# Patient Record
Sex: Female | Born: 1965
Health system: Southern US, Community
[De-identification: ages and names within clinical notes are randomized; demographics above are authoritative.]

## PROBLEM LIST (undated history)

## (undated) DIAGNOSIS — N6459 Other signs and symptoms in breast: Secondary | ICD-10-CM

## (undated) DIAGNOSIS — F419 Anxiety disorder, unspecified: Secondary | ICD-10-CM

## (undated) DIAGNOSIS — F329 Major depressive disorder, single episode, unspecified: Secondary | ICD-10-CM

## (undated) DIAGNOSIS — E669 Obesity, unspecified: Secondary | ICD-10-CM

## (undated) DIAGNOSIS — K219 Gastro-esophageal reflux disease without esophagitis: Secondary | ICD-10-CM

## (undated) DIAGNOSIS — F32A Depression, unspecified: Secondary | ICD-10-CM

## (undated) DIAGNOSIS — K5900 Constipation, unspecified: Secondary | ICD-10-CM

## (undated) DIAGNOSIS — N390 Urinary tract infection, site not specified: Secondary | ICD-10-CM

## (undated) HISTORY — PX: CHOLECYSTECTOMY: SHX55

## (undated) HISTORY — PX: TONSILLECTOMY: SUR1361

## (undated) HISTORY — DX: Major depressive disorder, single episode, unspecified: F32.9

## (undated) HISTORY — PX: TUBAL LIGATION: SHX77

## (undated) HISTORY — DX: Urinary tract infection, site not specified: N39.0

## (undated) HISTORY — DX: Constipation, unspecified: K59.00

## (undated) HISTORY — DX: Gastro-esophageal reflux disease without esophagitis: K21.9

## (undated) HISTORY — DX: Anxiety disorder, unspecified: F41.9

## (undated) HISTORY — DX: Other signs and symptoms in breast: N64.59

## (undated) HISTORY — DX: Obesity, unspecified: E66.9

## (undated) HISTORY — DX: Depression, unspecified: F32.A

---

## 1997-08-09 ENCOUNTER — Ambulatory Visit (HOSPITAL_COMMUNITY): Admission: RE | Admit: 1997-08-09 | Discharge: 1997-08-09 | Payer: Self-pay | Admitting: Obstetrics and Gynecology

## 1997-11-18 ENCOUNTER — Other Ambulatory Visit: Admission: RE | Admit: 1997-11-18 | Discharge: 1997-11-18 | Payer: Self-pay | Admitting: Obstetrics and Gynecology

## 1998-07-20 ENCOUNTER — Encounter: Payer: Self-pay | Admitting: Obstetrics and Gynecology

## 1998-07-20 ENCOUNTER — Ambulatory Visit (HOSPITAL_COMMUNITY): Admission: RE | Admit: 1998-07-20 | Discharge: 1998-07-20 | Payer: Self-pay | Admitting: Obstetrics and Gynecology

## 1998-09-01 ENCOUNTER — Other Ambulatory Visit: Admission: RE | Admit: 1998-09-01 | Discharge: 1998-09-01 | Payer: Self-pay | Admitting: Obstetrics and Gynecology

## 1999-10-22 ENCOUNTER — Inpatient Hospital Stay (HOSPITAL_COMMUNITY): Admission: AD | Admit: 1999-10-22 | Discharge: 1999-10-26 | Payer: Self-pay | Admitting: Obstetrics and Gynecology

## 1999-10-22 ENCOUNTER — Encounter (INDEPENDENT_AMBULATORY_CARE_PROVIDER_SITE_OTHER): Payer: Self-pay | Admitting: Specialist

## 1999-10-22 ENCOUNTER — Encounter: Payer: Self-pay | Admitting: Obstetrics and Gynecology

## 1999-10-23 ENCOUNTER — Encounter: Payer: Self-pay | Admitting: *Deleted

## 1999-10-27 ENCOUNTER — Encounter: Admission: RE | Admit: 1999-10-27 | Discharge: 1999-12-07 | Payer: Self-pay | Admitting: Obstetrics and Gynecology

## 1999-11-23 ENCOUNTER — Other Ambulatory Visit: Admission: RE | Admit: 1999-11-23 | Discharge: 1999-11-23 | Payer: Self-pay | Admitting: Obstetrics and Gynecology

## 2000-07-04 ENCOUNTER — Emergency Department (HOSPITAL_COMMUNITY): Admission: EM | Admit: 2000-07-04 | Discharge: 2000-07-05 | Payer: Self-pay | Admitting: Emergency Medicine

## 2000-12-07 ENCOUNTER — Other Ambulatory Visit: Admission: RE | Admit: 2000-12-07 | Discharge: 2000-12-07 | Payer: Self-pay | Admitting: *Deleted

## 2000-12-16 ENCOUNTER — Encounter: Admission: RE | Admit: 2000-12-16 | Discharge: 2000-12-16 | Payer: Self-pay | Admitting: *Deleted

## 2000-12-16 ENCOUNTER — Encounter: Payer: Self-pay | Admitting: Obstetrics and Gynecology

## 2001-05-08 ENCOUNTER — Ambulatory Visit (HOSPITAL_COMMUNITY): Admission: RE | Admit: 2001-05-08 | Discharge: 2001-05-08 | Payer: Self-pay | Admitting: Pulmonary Disease

## 2001-05-08 ENCOUNTER — Encounter: Payer: Self-pay | Admitting: Pulmonary Disease

## 2001-06-28 ENCOUNTER — Emergency Department (HOSPITAL_COMMUNITY): Admission: EM | Admit: 2001-06-28 | Discharge: 2001-06-28 | Payer: Self-pay | Admitting: Emergency Medicine

## 2001-11-20 ENCOUNTER — Other Ambulatory Visit: Admission: RE | Admit: 2001-11-20 | Discharge: 2001-11-20 | Payer: Self-pay | Admitting: Obstetrics and Gynecology

## 2002-04-21 ENCOUNTER — Encounter: Admission: RE | Admit: 2002-04-21 | Discharge: 2002-04-21 | Payer: Self-pay | Admitting: Obstetrics and Gynecology

## 2002-04-21 ENCOUNTER — Encounter (INDEPENDENT_AMBULATORY_CARE_PROVIDER_SITE_OTHER): Payer: Self-pay | Admitting: *Deleted

## 2002-04-21 ENCOUNTER — Encounter: Payer: Self-pay | Admitting: Obstetrics and Gynecology

## 2002-05-23 ENCOUNTER — Inpatient Hospital Stay (HOSPITAL_COMMUNITY): Admission: AD | Admit: 2002-05-23 | Discharge: 2002-05-27 | Payer: Self-pay | Admitting: Obstetrics and Gynecology

## 2002-05-24 ENCOUNTER — Encounter (INDEPENDENT_AMBULATORY_CARE_PROVIDER_SITE_OTHER): Payer: Self-pay

## 2002-05-24 ENCOUNTER — Encounter: Payer: Self-pay | Admitting: Obstetrics and Gynecology

## 2002-05-28 ENCOUNTER — Encounter: Admission: RE | Admit: 2002-05-28 | Discharge: 2002-06-27 | Payer: Self-pay | Admitting: Obstetrics and Gynecology

## 2002-06-23 ENCOUNTER — Other Ambulatory Visit: Admission: RE | Admit: 2002-06-23 | Discharge: 2002-06-23 | Payer: Self-pay | Admitting: Obstetrics and Gynecology

## 2002-07-21 ENCOUNTER — Encounter: Admission: RE | Admit: 2002-07-21 | Discharge: 2002-07-21 | Payer: Self-pay | Admitting: Gastroenterology

## 2002-07-21 ENCOUNTER — Encounter (INDEPENDENT_AMBULATORY_CARE_PROVIDER_SITE_OTHER): Payer: Self-pay | Admitting: *Deleted

## 2002-07-21 ENCOUNTER — Encounter: Payer: Self-pay | Admitting: Gastroenterology

## 2002-07-28 ENCOUNTER — Encounter: Admission: RE | Admit: 2002-07-28 | Discharge: 2002-08-27 | Payer: Self-pay | Admitting: Obstetrics and Gynecology

## 2002-08-28 ENCOUNTER — Encounter: Admission: RE | Admit: 2002-08-28 | Discharge: 2002-09-27 | Payer: Self-pay | Admitting: Obstetrics and Gynecology

## 2002-11-01 ENCOUNTER — Encounter: Admission: RE | Admit: 2002-11-01 | Discharge: 2002-11-01 | Payer: Self-pay | Admitting: Obstetrics and Gynecology

## 2002-11-01 ENCOUNTER — Encounter: Payer: Self-pay | Admitting: Obstetrics and Gynecology

## 2002-11-03 ENCOUNTER — Encounter: Payer: Self-pay | Admitting: Pulmonary Disease

## 2002-11-03 ENCOUNTER — Ambulatory Visit (HOSPITAL_COMMUNITY): Admission: RE | Admit: 2002-11-03 | Discharge: 2002-11-03 | Payer: Self-pay | Admitting: Pulmonary Disease

## 2003-07-07 ENCOUNTER — Other Ambulatory Visit: Admission: RE | Admit: 2003-07-07 | Discharge: 2003-07-07 | Payer: Self-pay | Admitting: Obstetrics and Gynecology

## 2003-07-20 ENCOUNTER — Encounter: Admission: RE | Admit: 2003-07-20 | Discharge: 2003-07-20 | Payer: Self-pay | Admitting: Obstetrics and Gynecology

## 2003-08-08 ENCOUNTER — Encounter: Admission: RE | Admit: 2003-08-08 | Discharge: 2003-08-08 | Payer: Self-pay | Admitting: Family Medicine

## 2004-04-19 ENCOUNTER — Encounter: Admission: RE | Admit: 2004-04-19 | Discharge: 2004-04-19 | Payer: Self-pay | Admitting: Family Medicine

## 2004-06-05 ENCOUNTER — Ambulatory Visit: Payer: Self-pay | Admitting: Pulmonary Disease

## 2004-06-18 ENCOUNTER — Ambulatory Visit: Payer: Self-pay | Admitting: Pulmonary Disease

## 2004-07-05 ENCOUNTER — Ambulatory Visit (HOSPITAL_COMMUNITY): Admission: RE | Admit: 2004-07-05 | Discharge: 2004-07-05 | Payer: Self-pay | Admitting: Gastroenterology

## 2004-07-05 ENCOUNTER — Encounter (INDEPENDENT_AMBULATORY_CARE_PROVIDER_SITE_OTHER): Payer: Self-pay | Admitting: *Deleted

## 2004-07-10 ENCOUNTER — Encounter (INDEPENDENT_AMBULATORY_CARE_PROVIDER_SITE_OTHER): Payer: Self-pay | Admitting: *Deleted

## 2004-07-10 ENCOUNTER — Ambulatory Visit (HOSPITAL_COMMUNITY): Admission: RE | Admit: 2004-07-10 | Discharge: 2004-07-10 | Payer: Self-pay | Admitting: Gastroenterology

## 2004-07-10 ENCOUNTER — Ambulatory Visit: Payer: Self-pay | Admitting: Pulmonary Disease

## 2004-07-11 ENCOUNTER — Encounter (INDEPENDENT_AMBULATORY_CARE_PROVIDER_SITE_OTHER): Payer: Self-pay | Admitting: *Deleted

## 2004-07-11 ENCOUNTER — Ambulatory Visit (HOSPITAL_COMMUNITY): Admission: RE | Admit: 2004-07-11 | Discharge: 2004-07-11 | Payer: Self-pay | Admitting: Gastroenterology

## 2004-07-26 ENCOUNTER — Encounter (INDEPENDENT_AMBULATORY_CARE_PROVIDER_SITE_OTHER): Payer: Self-pay | Admitting: *Deleted

## 2004-07-26 ENCOUNTER — Inpatient Hospital Stay (HOSPITAL_COMMUNITY): Admission: RE | Admit: 2004-07-26 | Discharge: 2004-07-28 | Payer: Self-pay | Admitting: General Surgery

## 2004-10-23 ENCOUNTER — Ambulatory Visit: Payer: Self-pay | Admitting: Internal Medicine

## 2004-10-31 ENCOUNTER — Ambulatory Visit: Payer: Self-pay | Admitting: Pulmonary Disease

## 2004-11-07 ENCOUNTER — Ambulatory Visit: Payer: Self-pay | Admitting: Pulmonary Disease

## 2004-12-11 ENCOUNTER — Emergency Department (HOSPITAL_COMMUNITY): Admission: EM | Admit: 2004-12-11 | Discharge: 2004-12-11 | Payer: Self-pay | Admitting: Emergency Medicine

## 2005-01-15 ENCOUNTER — Ambulatory Visit (HOSPITAL_COMMUNITY): Admission: RE | Admit: 2005-01-15 | Discharge: 2005-01-15 | Payer: Self-pay | Admitting: Family Medicine

## 2005-01-15 ENCOUNTER — Encounter (INDEPENDENT_AMBULATORY_CARE_PROVIDER_SITE_OTHER): Payer: Self-pay | Admitting: *Deleted

## 2005-01-16 ENCOUNTER — Ambulatory Visit: Payer: Self-pay | Admitting: Pulmonary Disease

## 2005-02-18 ENCOUNTER — Ambulatory Visit: Payer: Self-pay | Admitting: Pulmonary Disease

## 2005-03-04 ENCOUNTER — Ambulatory Visit: Payer: Self-pay | Admitting: Internal Medicine

## 2005-06-17 ENCOUNTER — Encounter: Admission: RE | Admit: 2005-06-17 | Discharge: 2005-06-17 | Payer: Self-pay | Admitting: Family Medicine

## 2005-06-17 ENCOUNTER — Encounter (INDEPENDENT_AMBULATORY_CARE_PROVIDER_SITE_OTHER): Payer: Self-pay | Admitting: *Deleted

## 2005-06-18 ENCOUNTER — Encounter: Admission: RE | Admit: 2005-06-18 | Discharge: 2005-06-18 | Payer: Self-pay | Admitting: Family Medicine

## 2005-07-05 ENCOUNTER — Ambulatory Visit: Payer: Self-pay | Admitting: Pulmonary Disease

## 2005-07-08 HISTORY — PX: NISSEN FUNDOPLICATION: SHX2091

## 2005-07-09 ENCOUNTER — Ambulatory Visit: Payer: Self-pay | Admitting: Internal Medicine

## 2005-07-10 ENCOUNTER — Ambulatory Visit: Payer: Self-pay | Admitting: Internal Medicine

## 2005-07-10 ENCOUNTER — Encounter (INDEPENDENT_AMBULATORY_CARE_PROVIDER_SITE_OTHER): Payer: Self-pay | Admitting: *Deleted

## 2005-07-11 ENCOUNTER — Ambulatory Visit (HOSPITAL_COMMUNITY): Admission: RE | Admit: 2005-07-11 | Discharge: 2005-07-11 | Payer: Self-pay | Admitting: Internal Medicine

## 2005-07-11 ENCOUNTER — Encounter: Payer: Self-pay | Admitting: Internal Medicine

## 2005-07-12 ENCOUNTER — Ambulatory Visit (HOSPITAL_COMMUNITY): Admission: RE | Admit: 2005-07-12 | Discharge: 2005-07-12 | Payer: Self-pay | Admitting: Internal Medicine

## 2005-07-12 ENCOUNTER — Ambulatory Visit: Payer: Self-pay | Admitting: Internal Medicine

## 2005-07-16 ENCOUNTER — Ambulatory Visit (HOSPITAL_BASED_OUTPATIENT_CLINIC_OR_DEPARTMENT_OTHER): Admission: RE | Admit: 2005-07-16 | Discharge: 2005-07-16 | Payer: Self-pay | Admitting: Orthopedic Surgery

## 2005-07-18 ENCOUNTER — Encounter (INDEPENDENT_AMBULATORY_CARE_PROVIDER_SITE_OTHER): Payer: Self-pay | Admitting: *Deleted

## 2005-07-23 ENCOUNTER — Encounter: Admission: RE | Admit: 2005-07-23 | Discharge: 2005-07-23 | Payer: Self-pay | Admitting: Family Medicine

## 2005-08-01 ENCOUNTER — Ambulatory Visit (HOSPITAL_COMMUNITY): Admission: RE | Admit: 2005-08-01 | Discharge: 2005-08-01 | Payer: Self-pay | Admitting: Family Medicine

## 2005-08-01 ENCOUNTER — Encounter: Payer: Self-pay | Admitting: Family Medicine

## 2005-08-12 ENCOUNTER — Encounter: Admission: RE | Admit: 2005-08-12 | Discharge: 2005-08-12 | Payer: Self-pay | Admitting: Family Medicine

## 2005-08-12 ENCOUNTER — Encounter: Payer: Self-pay | Admitting: Family Medicine

## 2005-08-28 ENCOUNTER — Encounter (INDEPENDENT_AMBULATORY_CARE_PROVIDER_SITE_OTHER): Payer: Self-pay | Admitting: *Deleted

## 2005-08-29 ENCOUNTER — Encounter (INDEPENDENT_AMBULATORY_CARE_PROVIDER_SITE_OTHER): Payer: Self-pay | Admitting: *Deleted

## 2005-08-29 ENCOUNTER — Inpatient Hospital Stay (HOSPITAL_COMMUNITY): Admission: RE | Admit: 2005-08-29 | Discharge: 2005-08-31 | Payer: Self-pay | Admitting: Surgery

## 2005-10-10 ENCOUNTER — Ambulatory Visit: Payer: Self-pay | Admitting: Pulmonary Disease

## 2005-12-03 ENCOUNTER — Encounter: Admission: RE | Admit: 2005-12-03 | Discharge: 2005-12-03 | Payer: Self-pay | Admitting: Otolaryngology

## 2006-03-24 ENCOUNTER — Encounter: Admission: RE | Admit: 2006-03-24 | Discharge: 2006-03-24 | Payer: Self-pay | Admitting: Surgery

## 2006-03-24 ENCOUNTER — Encounter (INDEPENDENT_AMBULATORY_CARE_PROVIDER_SITE_OTHER): Payer: Self-pay | Admitting: *Deleted

## 2006-03-31 ENCOUNTER — Ambulatory Visit: Payer: Self-pay | Admitting: Family Medicine

## 2006-05-26 ENCOUNTER — Ambulatory Visit: Payer: Self-pay | Admitting: Family Medicine

## 2006-06-12 ENCOUNTER — Ambulatory Visit: Payer: Self-pay | Admitting: Family Medicine

## 2006-06-26 ENCOUNTER — Ambulatory Visit: Payer: Self-pay | Admitting: Family Medicine

## 2006-07-04 ENCOUNTER — Ambulatory Visit: Payer: Self-pay | Admitting: Family Medicine

## 2006-07-04 LAB — CONVERTED CEMR LAB
Basophils Absolute: 0.2 10*3/uL — ABNORMAL HIGH (ref 0.0–0.1)
Basophils Relative: 2.3 % — ABNORMAL HIGH (ref 0.0–1.0)
Eosinophil percent: 7.1 % — ABNORMAL HIGH (ref 0.0–5.0)
HCT: 36.6 % (ref 36.0–46.0)
Hemoglobin: 12.8 g/dL (ref 12.0–15.0)
Lymphocytes Relative: 28.4 % (ref 12.0–46.0)
MCHC: 35.1 g/dL (ref 30.0–36.0)
MCV: 84.1 fL (ref 78.0–100.0)
Monocytes Absolute: 0.6 10*3/uL (ref 0.2–0.7)
Monocytes Relative: 7.6 % (ref 3.0–11.0)
Neutro Abs: 4 10*3/uL (ref 1.4–7.7)
Neutrophils Relative %: 54.6 % (ref 43.0–77.0)
Platelets: 243 10*3/uL (ref 150–400)
RBC: 4.36 M/uL (ref 3.87–5.11)
RDW: 12.9 % (ref 11.5–14.6)
WBC: 7.4 10*3/uL (ref 4.5–10.5)

## 2006-08-06 DIAGNOSIS — J309 Allergic rhinitis, unspecified: Secondary | ICD-10-CM | POA: Insufficient documentation

## 2006-08-06 DIAGNOSIS — F329 Major depressive disorder, single episode, unspecified: Secondary | ICD-10-CM | POA: Insufficient documentation

## 2006-08-06 DIAGNOSIS — J45909 Unspecified asthma, uncomplicated: Secondary | ICD-10-CM | POA: Insufficient documentation

## 2006-08-21 ENCOUNTER — Ambulatory Visit: Payer: Self-pay | Admitting: Critical Care Medicine

## 2006-08-26 ENCOUNTER — Ambulatory Visit: Payer: Self-pay | Admitting: Pulmonary Disease

## 2006-09-01 ENCOUNTER — Ambulatory Visit: Payer: Self-pay | Admitting: Pulmonary Disease

## 2006-12-16 ENCOUNTER — Ambulatory Visit: Payer: Self-pay | Admitting: Pulmonary Disease

## 2006-12-18 ENCOUNTER — Encounter (INDEPENDENT_AMBULATORY_CARE_PROVIDER_SITE_OTHER): Payer: Self-pay | Admitting: Family Medicine

## 2006-12-18 ENCOUNTER — Encounter: Payer: Self-pay | Admitting: Pulmonary Disease

## 2006-12-26 ENCOUNTER — Telehealth (INDEPENDENT_AMBULATORY_CARE_PROVIDER_SITE_OTHER): Payer: Self-pay | Admitting: *Deleted

## 2006-12-26 ENCOUNTER — Encounter: Admission: RE | Admit: 2006-12-26 | Discharge: 2006-12-26 | Payer: Self-pay | Admitting: Family Medicine

## 2006-12-26 ENCOUNTER — Ambulatory Visit: Payer: Self-pay | Admitting: Family Medicine

## 2006-12-26 LAB — CONVERTED CEMR LAB: Rapid Strep: NEGATIVE

## 2006-12-28 ENCOUNTER — Telehealth (INDEPENDENT_AMBULATORY_CARE_PROVIDER_SITE_OTHER): Payer: Self-pay | Admitting: Family Medicine

## 2006-12-31 ENCOUNTER — Telehealth (INDEPENDENT_AMBULATORY_CARE_PROVIDER_SITE_OTHER): Payer: Self-pay | Admitting: *Deleted

## 2007-01-05 ENCOUNTER — Telehealth (INDEPENDENT_AMBULATORY_CARE_PROVIDER_SITE_OTHER): Payer: Self-pay | Admitting: *Deleted

## 2007-01-13 ENCOUNTER — Encounter (INDEPENDENT_AMBULATORY_CARE_PROVIDER_SITE_OTHER): Payer: Self-pay | Admitting: Family Medicine

## 2007-01-21 ENCOUNTER — Encounter: Admission: RE | Admit: 2007-01-21 | Discharge: 2007-01-21 | Payer: Self-pay | Admitting: Surgery

## 2007-01-21 ENCOUNTER — Encounter (INDEPENDENT_AMBULATORY_CARE_PROVIDER_SITE_OTHER): Payer: Self-pay | Admitting: *Deleted

## 2007-02-25 ENCOUNTER — Encounter: Admission: RE | Admit: 2007-02-25 | Discharge: 2007-02-25 | Payer: Self-pay | Admitting: Surgery

## 2007-05-14 IMAGING — RF DG ESOPHAGUS
15 of 19 series · 19 of 24 positions shown · non-contrast
Comparison: none

[Series 1: run · 5 of 40 slices shown (1 of 15)]
[im 1/40]
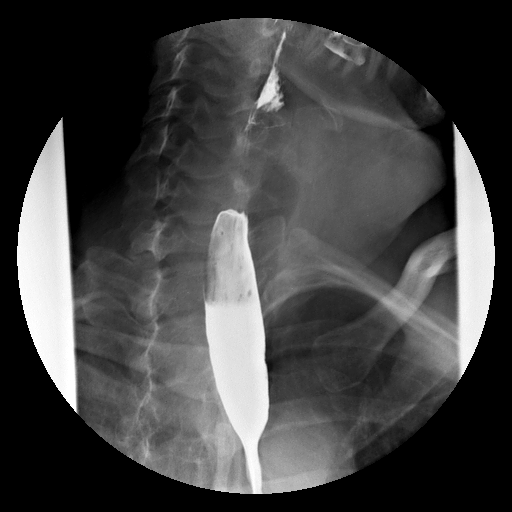
[im 8/40]
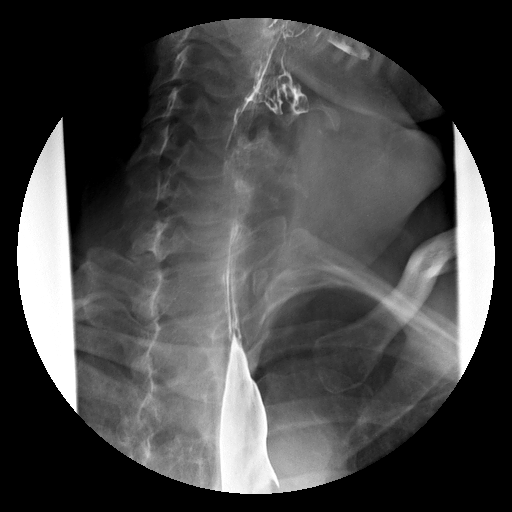
[im 24/40]
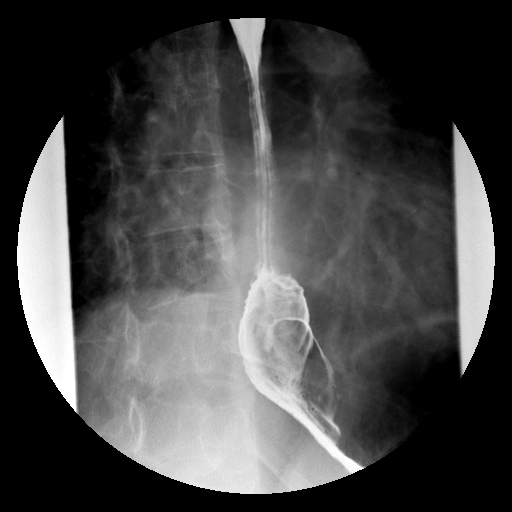
[im 32/40]
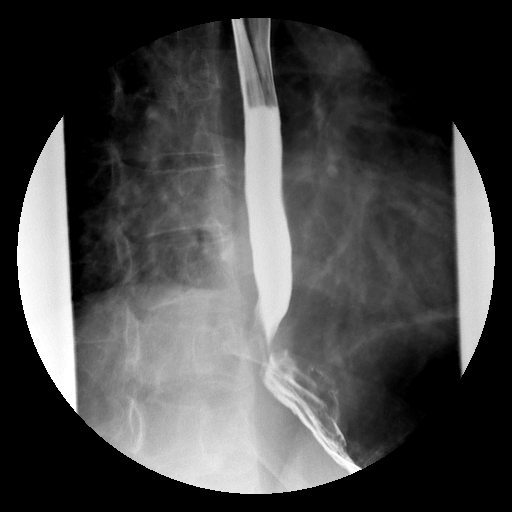
[im 40/40]
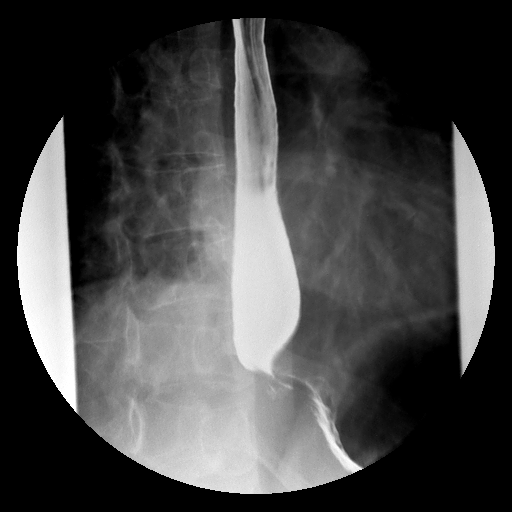

[Series 2: run · 1 of 1 slices shown (2 of 15)]
[im 1/1]
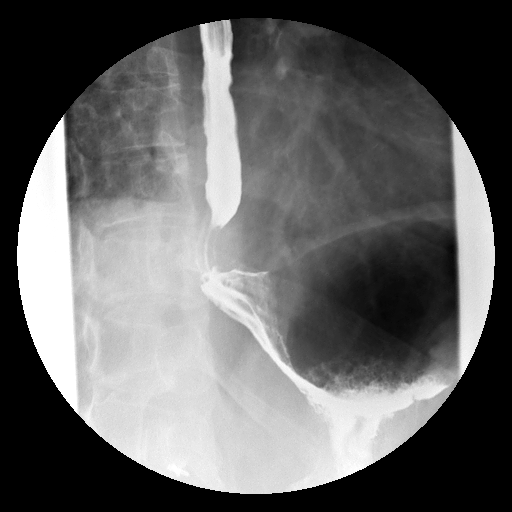

[Series 4: run · 1 of 1 slices shown (3 of 15)]
[im 1/1]
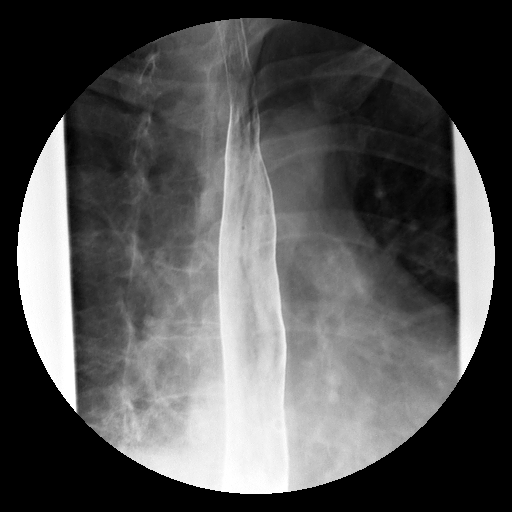

[Series 5: run · 1 of 1 slices shown (4 of 15)]
[im 1/1]
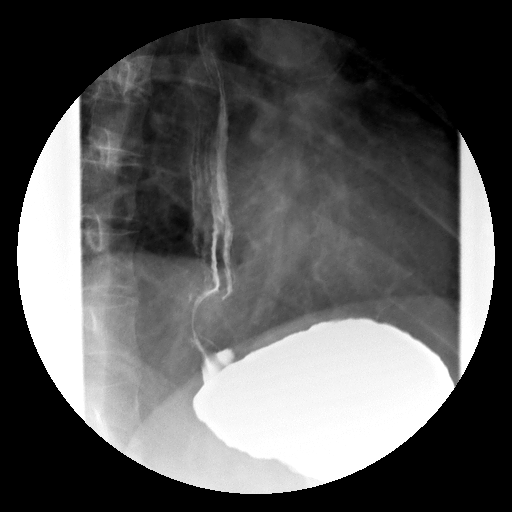

[Series 6: run · 1 of 1 slices shown (5 of 15)]
[im 1/1]
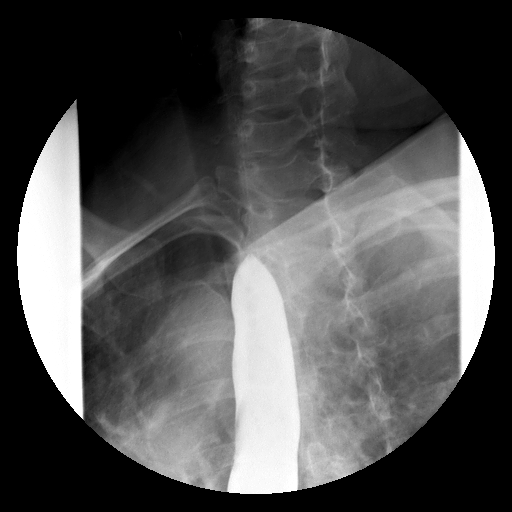

[Series 8: run · 1 of 1 slices shown (6 of 15)]
[im 1/1]
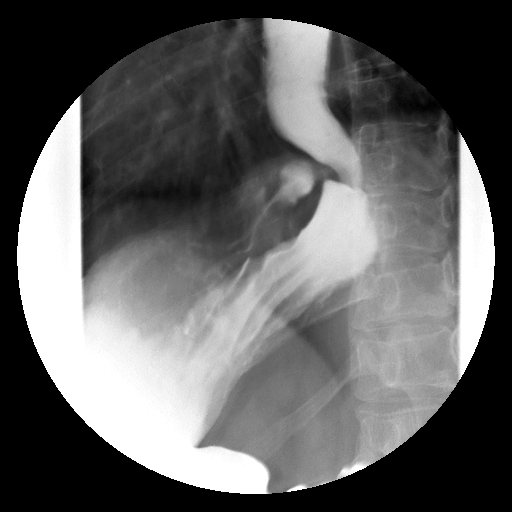

[Series 9: run · 1 of 1 slices shown (7 of 15)]
[im 1/1]
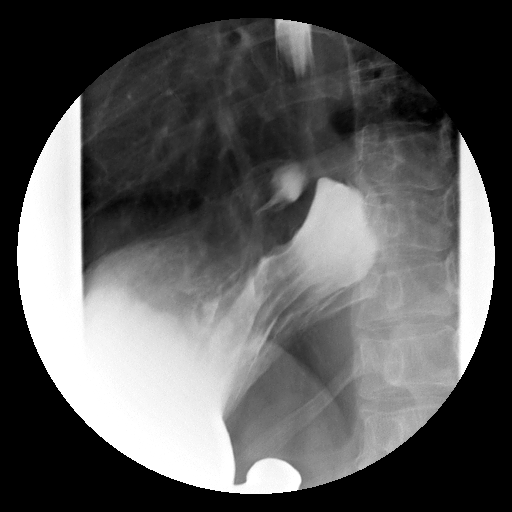

[Series 10: run · 1 of 1 slices shown (8 of 15)]
[im 1/1]
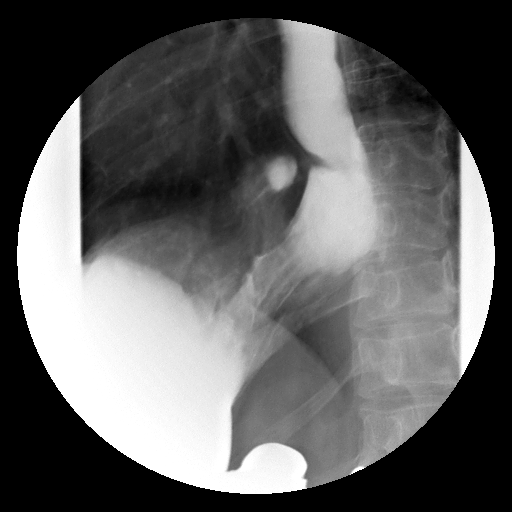

[Series 11: run · 1 of 1 slices shown (9 of 15)]
[im 1/1]
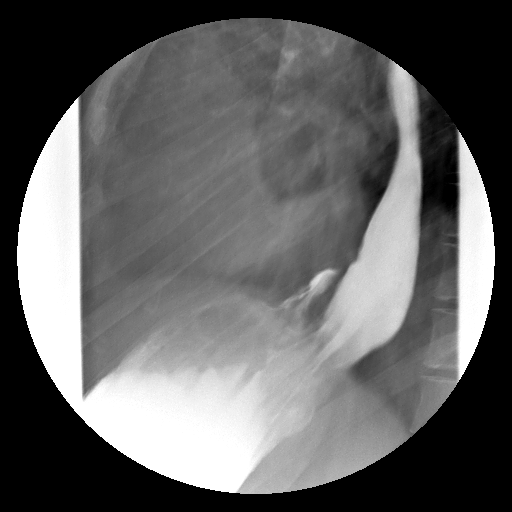

[Series 13: run · 1 of 1 slices shown (10 of 15)]
[im 1/1]
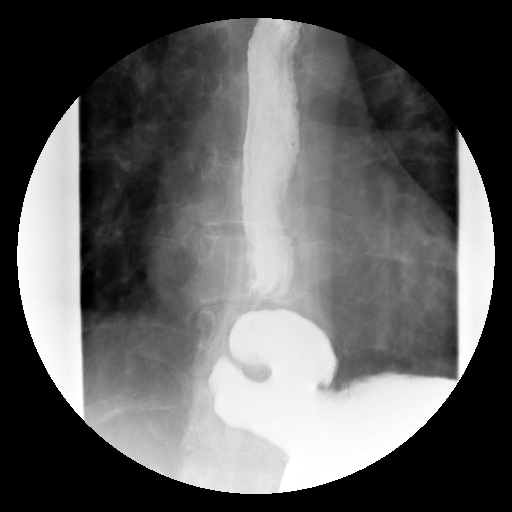

[Series 14: run · 1 of 1 slices shown (11 of 15)]
[im 1/1]
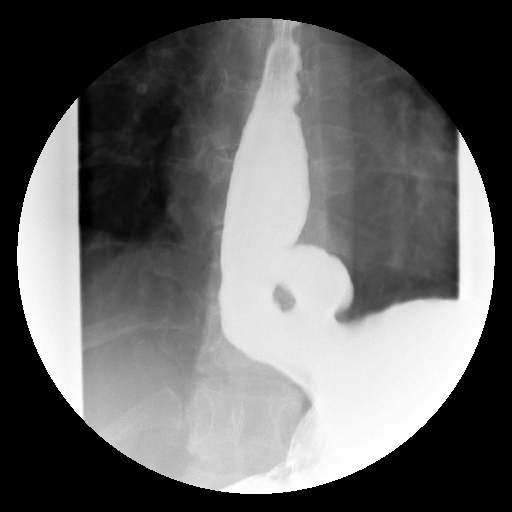

[Series 15: run · 1 of 1 slices shown (12 of 15)]
[im 1/1]
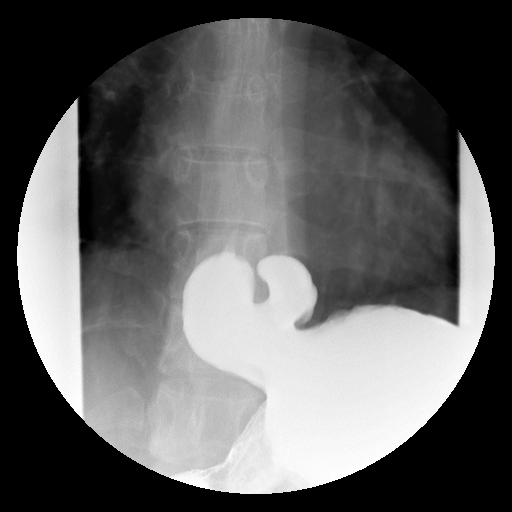

[Series 16: run · 1 of 1 slices shown (13 of 15)]
[im 1/1]
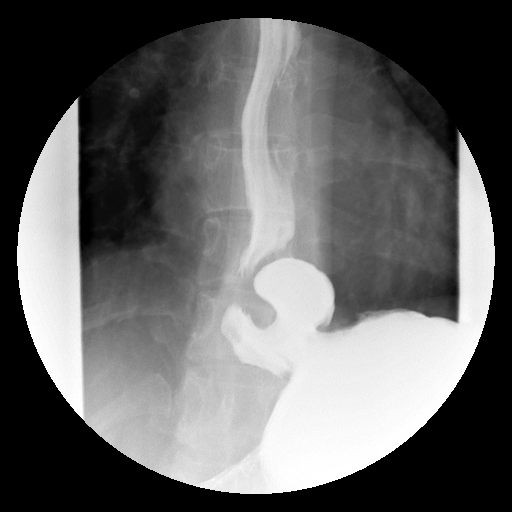

[Series 18: run · 1 of 1 slices shown (14 of 15)]
[im 1/1]
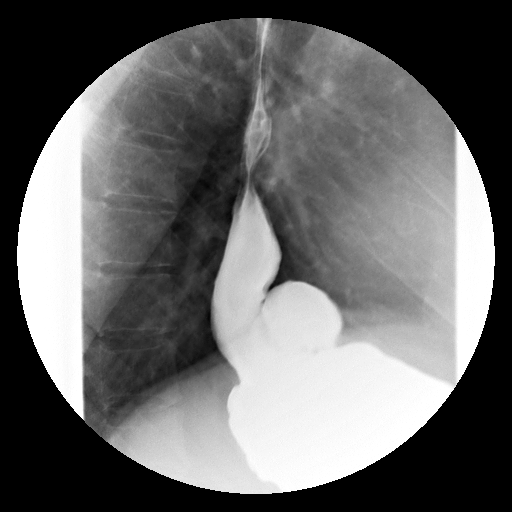

[Series 19: run · 1 of 1 slices shown (15 of 15)]
[im 1/1]
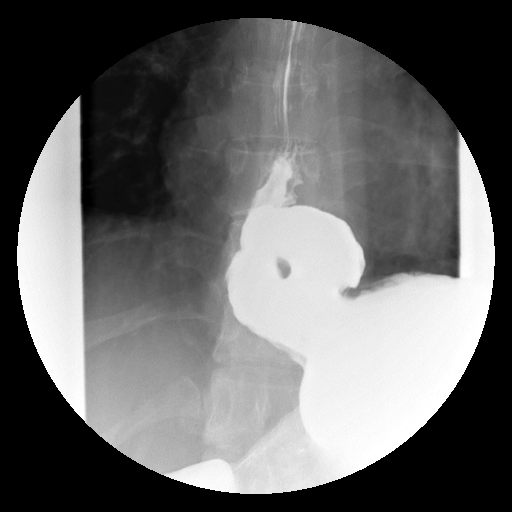

[19 of 24 positions shown; findings below may reference images not displayed]

<div class=9ectionB><p class=MsoNormal>Clinical Data:<span style='mso-spacerun:yes'>? </span>Preop Nissen fundoplication. <span style='mso-spacerun:yes'>?</span><span class=GramE>Chronic reflux.</span> 
 <p class=MsoNormal>BARIUM SWALLOW:
 <p class=MsoNormal>Findings:<span style='mso-spacerun:yes'>? </span>There is mild decrease in esophageal motility.<span style='mso-spacerun:yes'>? </span>There is a moderately large sliding hiatal hernia. <span style='mso-spacerun:yes'>?</span>There is moderate gastroesophageal reflux.<span style='mso-spacerun:yes'>? </span>
 <p class=MsoNormal>With the patient supine, there is a paraesophageal hernia also present.<span style='mso-spacerun:yes'>? </span>This has an appearance of a large diverticulum off of the fundus of the stomach extending through the diaphragmatic hiatus.<span style='mso-spacerun:yes'>? </span>
 <p class=MsoNormal>IMPRESSION:
 <p class=MsoNormal><span class=GramE>Moderately large sliding hiatal hernia with a large amount of gastroesophageal reflux.</span><span style='mso-spacerun:yes'>? </span>In addition there is a paraesophageal hernia. 
 </div>

## 2007-07-08 ENCOUNTER — Ambulatory Visit: Payer: Self-pay | Admitting: Pulmonary Disease

## 2007-07-09 HISTORY — PX: VENTRAL HERNIA REPAIR: SHX424

## 2007-07-16 ENCOUNTER — Ambulatory Visit (HOSPITAL_COMMUNITY): Admission: RE | Admit: 2007-07-16 | Discharge: 2007-07-16 | Payer: Self-pay | Admitting: Surgery

## 2007-07-16 ENCOUNTER — Encounter (INDEPENDENT_AMBULATORY_CARE_PROVIDER_SITE_OTHER): Payer: Self-pay | Admitting: *Deleted

## 2007-07-29 ENCOUNTER — Ambulatory Visit: Payer: Self-pay | Admitting: Pulmonary Disease

## 2007-07-29 DIAGNOSIS — K219 Gastro-esophageal reflux disease without esophagitis: Secondary | ICD-10-CM | POA: Insufficient documentation

## 2007-11-01 ENCOUNTER — Emergency Department (HOSPITAL_COMMUNITY): Admission: EM | Admit: 2007-11-01 | Discharge: 2007-11-01 | Payer: Self-pay | Admitting: Emergency Medicine

## 2007-12-15 ENCOUNTER — Ambulatory Visit: Payer: Self-pay | Admitting: Family Medicine

## 2007-12-24 ENCOUNTER — Ambulatory Visit: Payer: Self-pay | Admitting: Family Medicine

## 2007-12-24 LAB — CONVERTED CEMR LAB
Bilirubin Urine: NEGATIVE
Glucose, Urine, Semiquant: NEGATIVE
Ketones, urine, test strip: NEGATIVE
Nitrite: POSITIVE
Specific Gravity, Urine: 1.02
Urobilinogen, UA: 0.2
pH: 7

## 2007-12-25 LAB — CONVERTED CEMR LAB
ALT: 20 units/L (ref 0–35)
AST: 22 units/L (ref 0–37)
Albumin: 4 g/dL (ref 3.5–5.2)
Alkaline Phosphatase: 72 units/L (ref 39–117)
BUN: 10 mg/dL (ref 6–23)
Basophils Absolute: 0.1 10*3/uL (ref 0.0–0.1)
Basophils Relative: 0.9 % (ref 0.0–1.0)
Bilirubin, Direct: 0.1 mg/dL (ref 0.0–0.3)
CO2: 32 meq/L (ref 19–32)
Calcium: 9.4 mg/dL (ref 8.4–10.5)
Chloride: 103 meq/L (ref 96–112)
Cholesterol: 236 mg/dL (ref 0–200)
Creatinine, Ser: 1 mg/dL (ref 0.4–1.2)
Direct LDL: 152.7 mg/dL
Eosinophils Absolute: 0.5 10*3/uL (ref 0.0–0.7)
Eosinophils Relative: 7.6 % — ABNORMAL HIGH (ref 0.0–5.0)
GFR calc Af Amer: 79 mL/min
GFR calc non Af Amer: 65 mL/min
Glucose, Bld: 106 mg/dL — ABNORMAL HIGH (ref 70–99)
HCT: 39.1 % (ref 36.0–46.0)
HDL: 56.9 mg/dL (ref >39.0)
Hemoglobin: 13.3 g/dL (ref 12.0–15.0)
Lymphocytes Relative: 31.1 % (ref 12.0–46.0)
MCHC: 34.1 g/dL (ref 30.0–36.0)
MCV: 86 fL (ref 78.0–100.0)
Monocytes Absolute: 0.5 10*3/uL (ref 0.1–1.0)
Monocytes Relative: 8.1 % (ref 3.0–12.0)
Neutro Abs: 3.4 10*3/uL (ref 1.4–7.7)
Neutrophils Relative %: 52.3 % (ref 43.0–77.0)
Platelets: 308 10*3/uL (ref 150–400)
Potassium: 4.1 meq/L (ref 3.5–5.1)
RBC: 4.54 M/uL (ref 3.87–5.11)
RDW: 12.9 % (ref 11.5–14.6)
Sodium: 145 meq/L (ref 135–145)
TSH: 2.19 microintl units/mL (ref 0.35–5.50)
Total Bilirubin: 0.8 mg/dL (ref 0.3–1.2)
Total CHOL/HDL Ratio: 4.1
Total Protein: 6.9 g/dL (ref 6.0–8.3)
Triglycerides: 133 mg/dL (ref 0–149)
VLDL: 27 mg/dL (ref 0–40)
WBC: 6.5 10*3/uL (ref 4.5–10.5)

## 2008-02-08 ENCOUNTER — Ambulatory Visit: Payer: Self-pay | Admitting: Internal Medicine

## 2008-03-04 ENCOUNTER — Encounter: Admission: RE | Admit: 2008-03-04 | Discharge: 2008-03-04 | Payer: Self-pay | Admitting: Obstetrics and Gynecology

## 2008-03-08 ENCOUNTER — Ambulatory Visit: Payer: Self-pay | Admitting: Pulmonary Disease

## 2008-03-11 ENCOUNTER — Encounter: Payer: Self-pay | Admitting: Pulmonary Disease

## 2008-03-14 ENCOUNTER — Encounter: Payer: Self-pay | Admitting: Pulmonary Disease

## 2008-06-13 ENCOUNTER — Ambulatory Visit: Payer: Self-pay | Admitting: Pulmonary Disease

## 2008-09-13 ENCOUNTER — Ambulatory Visit: Payer: Self-pay | Admitting: Pulmonary Disease

## 2008-09-20 ENCOUNTER — Telehealth: Payer: Self-pay | Admitting: Adult Health

## 2008-09-21 ENCOUNTER — Ambulatory Visit: Payer: Self-pay | Admitting: Family Medicine

## 2008-09-21 DIAGNOSIS — M79609 Pain in unspecified limb: Secondary | ICD-10-CM | POA: Insufficient documentation

## 2008-09-22 ENCOUNTER — Ambulatory Visit: Payer: Self-pay | Admitting: Family Medicine

## 2008-10-02 ENCOUNTER — Emergency Department (HOSPITAL_COMMUNITY): Admission: EM | Admit: 2008-10-02 | Discharge: 2008-10-02 | Payer: Self-pay | Admitting: Emergency Medicine

## 2008-10-06 ENCOUNTER — Ambulatory Visit: Payer: Self-pay | Admitting: Family Medicine

## 2008-10-10 ENCOUNTER — Ambulatory Visit: Payer: Self-pay | Admitting: Family Medicine

## 2008-10-17 ENCOUNTER — Telehealth: Payer: Self-pay | Admitting: Family Medicine

## 2008-10-19 ENCOUNTER — Encounter: Payer: Self-pay | Admitting: Family Medicine

## 2008-12-23 ENCOUNTER — Telehealth: Payer: Self-pay | Admitting: Family Medicine

## 2008-12-26 ENCOUNTER — Ambulatory Visit: Payer: Self-pay | Admitting: Family Medicine

## 2008-12-27 ENCOUNTER — Ambulatory Visit: Payer: Self-pay | Admitting: Family Medicine

## 2008-12-27 LAB — CONVERTED CEMR LAB
Bilirubin Urine: NEGATIVE
Blood in Urine, dipstick: NEGATIVE
Glucose, Urine, Semiquant: NEGATIVE
Ketones, urine, test strip: NEGATIVE
Nitrite: NEGATIVE
Protein, U semiquant: NEGATIVE
Specific Gravity, Urine: 1.015
Urobilinogen, UA: 0.2
pH: 7

## 2008-12-30 LAB — CONVERTED CEMR LAB
ALT: 15 units/L (ref 0–35)
AST: 16 units/L (ref 0–37)
Albumin: 3.9 g/dL (ref 3.5–5.2)
Alkaline Phosphatase: 72 units/L (ref 39–117)
BUN: 6 mg/dL (ref 6–23)
Basophils Absolute: 0 10*3/uL (ref 0.0–0.1)
Basophils Relative: 0.8 % (ref 0.0–3.0)
Bilirubin, Direct: 0 mg/dL (ref 0.0–0.3)
CO2: 29 meq/L (ref 19–32)
Calcium: 9.1 mg/dL (ref 8.4–10.5)
Chloride: 103 meq/L (ref 96–112)
Cholesterol: 223 mg/dL — ABNORMAL HIGH (ref 0–200)
Creatinine, Ser: 0.8 mg/dL (ref 0.4–1.2)
Direct LDL: 154.2 mg/dL
Eosinophils Absolute: 0.4 10*3/uL (ref 0.0–0.7)
Eosinophils Relative: 6 % — ABNORMAL HIGH (ref 0.0–5.0)
GFR calc non Af Amer: 84 mL/min (ref >60)
Glucose, Bld: 90 mg/dL (ref 70–99)
HCT: 36.1 % (ref 36.0–46.0)
HDL: 51.8 mg/dL (ref >39.00)
Hemoglobin: 12.5 g/dL (ref 12.0–15.0)
Lymphocytes Relative: 31.9 % (ref 12.0–46.0)
Lymphs Abs: 1.9 10*3/uL (ref 0.7–4.0)
MCHC: 34.6 g/dL (ref 30.0–36.0)
MCV: 83 fL (ref 78.0–100.0)
Monocytes Absolute: 0.6 10*3/uL (ref 0.1–1.0)
Monocytes Relative: 9.6 % (ref 3.0–12.0)
Neutro Abs: 3.2 10*3/uL (ref 1.4–7.7)
Neutrophils Relative %: 51.7 % (ref 43.0–77.0)
Platelets: 235 10*3/uL (ref 150.0–400.0)
Potassium: 3.6 meq/L (ref 3.5–5.1)
RBC: 4.35 M/uL (ref 3.87–5.11)
RDW: 13.7 % (ref 11.5–14.6)
Sodium: 140 meq/L (ref 135–145)
TSH: 2.6 microintl units/mL (ref 0.35–5.50)
Total Bilirubin: 0.9 mg/dL (ref 0.3–1.2)
Total CHOL/HDL Ratio: 4
Total Protein: 6.9 g/dL (ref 6.0–8.3)
Triglycerides: 149 mg/dL (ref 0.0–149.0)
VLDL: 29.8 mg/dL (ref 0.0–40.0)
WBC: 6.1 10*3/uL (ref 4.5–10.5)

## 2009-04-24 ENCOUNTER — Encounter: Admission: RE | Admit: 2009-04-24 | Discharge: 2009-04-24 | Payer: Self-pay | Admitting: Obstetrics and Gynecology

## 2009-05-29 ENCOUNTER — Ambulatory Visit: Payer: Self-pay | Admitting: Family Medicine

## 2009-06-15 ENCOUNTER — Ambulatory Visit: Payer: Self-pay | Admitting: Pulmonary Disease

## 2009-06-21 ENCOUNTER — Telehealth (INDEPENDENT_AMBULATORY_CARE_PROVIDER_SITE_OTHER): Payer: Self-pay | Admitting: *Deleted

## 2009-09-15 ENCOUNTER — Encounter: Admission: RE | Admit: 2009-09-15 | Discharge: 2009-09-15 | Payer: Self-pay | Admitting: Obstetrics and Gynecology

## 2009-11-06 ENCOUNTER — Ambulatory Visit: Payer: Self-pay | Admitting: Pulmonary Disease

## 2009-11-22 ENCOUNTER — Ambulatory Visit: Payer: Self-pay | Admitting: Family Medicine

## 2009-11-23 LAB — CONVERTED CEMR LAB
ALT: 14 units/L (ref 0–35)
AST: 16 units/L (ref 0–37)
Albumin: 4.2 g/dL (ref 3.5–5.2)
Alkaline Phosphatase: 63 units/L (ref 39–117)
BUN: 9 mg/dL (ref 6–23)
Basophils Absolute: 0.1 10*3/uL (ref 0.0–0.1)
Basophils Relative: 1.5 % (ref 0.0–3.0)
Bilirubin, Direct: 0.1 mg/dL (ref 0.0–0.3)
CO2: 26 meq/L (ref 19–32)
Calcium: 9.2 mg/dL (ref 8.4–10.5)
Chloride: 103 meq/L (ref 96–112)
Cholesterol: 163 mg/dL (ref 0–200)
Creatinine, Ser: 0.6 mg/dL (ref 0.4–1.2)
Eosinophils Absolute: 0.4 10*3/uL (ref 0.0–0.7)
Eosinophils Relative: 5 % (ref 0.0–5.0)
GFR calc non Af Amer: 112.23 mL/min (ref >60)
Glucose, Bld: 73 mg/dL (ref 70–99)
HCT: 37.2 % (ref 36.0–46.0)
HDL: 53.6 mg/dL (ref >39.00)
Hemoglobin: 12.9 g/dL (ref 12.0–15.0)
LDL Cholesterol: 95 mg/dL (ref 0–99)
Lymphocytes Relative: 27.7 % (ref 12.0–46.0)
Lymphs Abs: 2.2 10*3/uL (ref 0.7–4.0)
MCHC: 34.7 g/dL (ref 30.0–36.0)
MCV: 92 fL (ref 78.0–100.0)
Monocytes Absolute: 0.5 10*3/uL (ref 0.1–1.0)
Monocytes Relative: 6.8 % (ref 3.0–12.0)
Neutro Abs: 4.6 10*3/uL (ref 1.4–7.7)
Neutrophils Relative %: 59 % (ref 43.0–77.0)
Platelets: 282 10*3/uL (ref 150.0–400.0)
Potassium: 3.6 meq/L (ref 3.5–5.1)
RBC: 4.04 M/uL (ref 3.87–5.11)
RDW: 12.2 % (ref 11.5–14.6)
Sodium: 140 meq/L (ref 135–145)
TSH: 3.36 microintl units/mL (ref 0.35–5.50)
Total Bilirubin: 0.8 mg/dL (ref 0.3–1.2)
Total CHOL/HDL Ratio: 3
Total Protein: 6.9 g/dL (ref 6.0–8.3)
Triglycerides: 74 mg/dL (ref 0.0–149.0)
VLDL: 14.8 mg/dL (ref 0.0–40.0)
WBC: 7.9 10*3/uL (ref 4.5–10.5)

## 2009-11-24 ENCOUNTER — Encounter: Payer: Self-pay | Admitting: Family Medicine

## 2009-11-24 ENCOUNTER — Ambulatory Visit: Payer: Self-pay | Admitting: Internal Medicine

## 2009-11-27 ENCOUNTER — Encounter (INDEPENDENT_AMBULATORY_CARE_PROVIDER_SITE_OTHER): Payer: Self-pay | Admitting: *Deleted

## 2010-01-22 ENCOUNTER — Ambulatory Visit: Payer: Self-pay | Admitting: Pulmonary Disease

## 2010-01-24 DIAGNOSIS — F411 Generalized anxiety disorder: Secondary | ICD-10-CM | POA: Insufficient documentation

## 2010-01-25 ENCOUNTER — Encounter: Payer: Self-pay | Admitting: Pulmonary Disease

## 2010-01-30 ENCOUNTER — Ambulatory Visit: Payer: Self-pay | Admitting: Internal Medicine

## 2010-02-22 ENCOUNTER — Ambulatory Visit: Payer: Self-pay | Admitting: Cardiology

## 2010-02-22 ENCOUNTER — Observation Stay (HOSPITAL_COMMUNITY): Admission: EM | Admit: 2010-02-22 | Discharge: 2010-02-23 | Payer: Self-pay | Admitting: Emergency Medicine

## 2010-02-23 ENCOUNTER — Encounter (INDEPENDENT_AMBULATORY_CARE_PROVIDER_SITE_OTHER): Payer: Self-pay | Admitting: Internal Medicine

## 2010-02-23 ENCOUNTER — Ambulatory Visit: Payer: Self-pay | Admitting: Surgery

## 2010-03-14 ENCOUNTER — Ambulatory Visit: Payer: Self-pay | Admitting: Internal Medicine

## 2010-05-14 ENCOUNTER — Ambulatory Visit: Payer: Self-pay | Admitting: Pulmonary Disease

## 2010-06-26 ENCOUNTER — Encounter
Admission: RE | Admit: 2010-06-26 | Discharge: 2010-06-26 | Payer: Self-pay | Source: Home / Self Care | Attending: Obstetrics and Gynecology | Admitting: Obstetrics and Gynecology

## 2010-07-04 ENCOUNTER — Ambulatory Visit
Admission: RE | Admit: 2010-07-04 | Discharge: 2010-07-04 | Payer: Self-pay | Source: Home / Self Care | Attending: Pulmonary Disease | Admitting: Pulmonary Disease

## 2010-07-28 ENCOUNTER — Encounter: Payer: Self-pay | Admitting: Gastroenterology

## 2010-07-29 ENCOUNTER — Encounter: Payer: Self-pay | Admitting: Internal Medicine

## 2010-08-07 NOTE — Letter (Signed)
Summary: External Correspondence--office visit  External Correspondence--office visit   Imported By: Freddy Jaksch 01/16/2007 11:34:54  _____________________________________________________________________  External Attachment:    Type:   Image     Comment:   External Document--office visit

## 2010-08-07 NOTE — Progress Notes (Signed)
Summary: [facility]  [facility]   Imported By: Job Founds 01/23/2007 11:49:50  _____________________________________________________________________  External Attachment:    Type:   Image     Comment:   External Document

## 2010-08-07 NOTE — Progress Notes (Signed)
Summary: results  Phone Note Call from Patient Call back at [phone]   Caller: Patient Call For: parrett Summary of Call: calling for xray results Initial call taken by: Rickard Patience,  June 21, 2009 8:39 AM  Follow-up for Phone Call        called, spoke with pt.  Pt informed of cxr results as stated in append on 06/15/09 by TP.  She verbalzed understanding. Follow-up by: Gweneth Dimitri RN,  June 21, 2009 8:57 AM

## 2010-08-07 NOTE — Letter (Signed)
Summary: New Patient letter  [facility]  [address] [place], [place] [address]   Phone: [phone]  Fax: [phone]       11/27/2009 MRN: [id number]  [name] [name] [address] [facility] [address] [place], [place]  [address]  Dear Ms. Konkel,  Welcome to the Gastroenterology Division at [facility].    You are scheduled to see Dr.  Juanda Chance on 01-30-10 at 10:30am on the 3rd floor at [facility], [address]  We ask that you try to arrive at our office 15 minutes prior to your appointment time to allow for check-in.  We would like you to complete the enclosed self-administered evaluation form prior to your visit and bring it with you on the day of your appointment.  We will review it with you.  Also, please bring a complete list of all your medications or, if you prefer, bring the medication bottles and we will list them.  Please bring your insurance card so that we may make a copy of it.  If your insurance requires a referral to see a specialist, please bring your referral form from your primary care physician.  Co-payments are due at the time of your visit and may be paid by cash, check or credit card.     Your office visit will consist of a consult with your physician (includes a physical exam), any laboratory testing he/she may order, scheduling of any necessary diagnostic testing (e.g. x-ray, ultrasound, CT-scan), and scheduling of a procedure (e.g. Endoscopy, Colonoscopy) if required.  Please allow enough time on your schedule to allow for any/all of these possibilities.    If you cannot keep your appointment, please call [phone] to cancel or reschedule prior to your appointment date.  This allows [place] the opportunity to schedule an appointment for another patient in need of care.  If you do not cancel or reschedule by 5 p.m. the business day prior to your appointment date, you will be charged a $50.00 late cancellation/no-show fee.    Thank you for  choosing [facility] for your medical needs.  We appreciate the opportunity to care for you.  Please visit [place] at our website  to learn more about our practice.                     Sincerely,                                                             The Gastroenterology Division

## 2010-08-07 NOTE — Miscellaneous (Signed)
Summary: Orders Update   Clinical Lists Changes  Orders: Added new Referral order of Gastroenterology Referral (GI) - Signed 

## 2010-08-07 NOTE — Letter (Signed)
Summary: Chief Financial Officer Insurance Certification Notification   Imported By: Roderic Ovens 01/23/2010 11:56:28  _____________________________________________________________________  External Attachment:    Type:   Image     Comment:   External Document

## 2010-08-07 NOTE — Assessment & Plan Note (Signed)
Summary: 3 months/apc   PCP:  Blossom Hoops  Chief Complaint:  3 month follow up - pt states she is doing well - needs refill on proair.  History of Present Illness: 45 year old female with known history of Asthma since childhood, worse during & after pregnancy, previously well controlled on Symbicort. Hx of severe reflux for many yrs, improved  after Nissen's 2 yr ago  02/08/08-- Pt woke up choking in middle of night, since then throat feels raw, has had dry cough, and felt wheezy >> steroid exacerbation.  9/1>> much improved, but c/o chest tightness, spirometry nml, FEv1 99% Breakthrough reflux 4-5/wk, takes tums every night with ranitidine; reglan did not work (dysmotility noted on studies by Dr. Daphine Deutscher post Nissen's). -describes wt gain 60 lbs over last 5 yrs with zoloft & wellbutrin   June 13, 2008--returns today for follow up. Improved with less chest tightness, using proAir 1-2 x/wk. Now on omeprazole 20mg  once daily. less reflux. Denies chest pain, dyspnea, orthopnea, hemoptysis, fever, n/v/d, edema.     Prior Medications Reviewed Using: Patient Recall  Updated Prior Medication List: ADVAIR DISKUS 250-50 MCG/DOSE MISC (FLUTICASONE-SALMETEROL) 1 puff two times a day OMEPRAZOLE 20 MG CPDR (OMEPRAZOLE) 1 by mouth once daily ZOLOFT 100 MG TABS (SERTRALINE HCL) 1 by mouth daily WELLBUTRIN XL 300 MG  TB24 (BUPROPION HCL) once daily ZYRTEC-D ALLERGY & CONGESTION 5-120 MG TB12 (CETIRIZINE-PSEUDOEPHEDRINE) Take 1 tablet by mouth twice a day TUMS ULTRA 1000 1000 MG  CHEW (CALCIUM CARBONATE ANTACID) as needed with meals PROAIR HFA 108 (90 BASE) MCG/ACT  AERS (ALBUTEROL SULFATE) 2 puffs q4 as needed wheezing [BMN]  Current Allergies (reviewed today): ! PENICILLIN ! SULFA  Past Medical History:    Reviewed history from 03/08/2008 and no changes required:       Allergic rhinitis       Asthma, sees Dr. Cyril Mourning       Depression, sees [facility] and Dr. Nolen Mu       GERD   UTI's       sees Debbora Dus NP or Dr. Cherly Hensen for gyn exams       normal sleep study 12-18-06   Family History:    Reviewed history from 02/08/2008 and no changes required:       Family History of Alcoholism/Addiction       Family History of Arthritis       Family History Breast cancer 1st degree relative <50       Family History of CAD Female 1st degree relative <50       Family History Hypertension                     mother living with rheumatoid arthritis       father living with no medical history       sister living with thyroid number       sister with chron's  Social History:    Reviewed history from 02/08/2008 and no changes required:       Married       Never Smoked       Alcohol use-no       stay-at-home mom       2 chidren   Risk Factors: Tobacco use:  never Alcohol use:  no   Review of Systems      See HPI   Vital Signs:  Patient Profile:   45 Years Old Female Height:     62 inches Weight:  197 pounds O2 Sat:      99 % O2 treatment:    Room Air Temp:     96.9 degrees F oral Pulse rate:   88 / minute BP sitting:   118 / 82  (left arm) Cuff size:   regular  Vitals Entered By: Boone Master CNA (June 13, 2008 9:58 AM)                 Physical Exam  GENERAL:  A/Ox3; pleasant & cooperative.NAD HEENT:  [place]/AT, EOM-wnl, PERRLA, EACs-clear, TMs-wnl, NOSE-clear, THROAT-clear & wnl. NECK:  Supple w/ fair ROM; no JVD; normal carotid impulses w/o bruits; no thyromegaly or nodules palpated; no lymphadenopathy. CHEST:  CTA no wheezing.  HEART:  RRR, no m/r/g  heard ABDOMEN:  Soft & nt; nml bowel sounds; no organomegaly or masses detected. EXT: Warm bilat,  no calf pain, edema, clubbing, pulses intact Skin: no rash/lesion       Impression & Recommendations:  Problem # 1:  ASTHMA (ICD-493.90) Improved control on present regimen.  follow up 4 months Dr. Vassie Loll  Her updated medication list for this problem includes:    Advair Diskus 250-50  Mcg/dose Misc (Fluticasone-salmeterol) .Marland Kitchen... 1 puff two times a day    Proair Hfa 108 (90 Base) Mcg/act Aers (Albuterol sulfate) .Marland Kitchen... 2 puffs q4 as needed wheezing  Orders: Est. Patient Level II ([address])   Problem # 2:  GERD (ICD-530.81) Improved control on maintenence of PPI once daily  may use pepcid or zantac at bedtime for breakthrough reflux.  The following medications were removed from the medication list:    Protonix 40 Mg Tbec (Pantoprazole sodium) .Marland Kitchen... Take 1 tablet by mouth once a day  Her updated medication list for this problem includes:    Omeprazole 20 Mg Cpdr (Omeprazole) .Marland Kitchen... 1 by mouth once daily    Tums Ultra 1000 1000 Mg Chew (Calcium carbonate antacid) .Marland Kitchen... As needed with meals  Labs Reviewed: Hgb: 13.3 (12/24/2007)   Hct: 39.1 (12/24/2007)  Orders: Est. Patient Level II ([address])   Medications Added to Medication List This Visit: 1)  Advair Diskus 250-50 Mcg/dose Misc (Fluticasone-salmeterol) .Marland Kitchen.. 1 puff two times a day 2)  Omeprazole 20 Mg Cpdr (Omeprazole) .Marland Kitchen.. 1 by mouth once daily 3)  Protonix 40 Mg Tbec (Pantoprazole sodium) .... Take 1 tablet by mouth once a day 4)  Tums Ultra 1000 1000 Mg Chew (Calcium carbonate antacid) .... As needed with meals  Complete Medication List: 1)  Advair Diskus 250-50 Mcg/dose Misc (Fluticasone-salmeterol) .Marland Kitchen.. 1 puff two times a day 2)  Omeprazole 20 Mg Cpdr (Omeprazole) .Marland Kitchen.. 1 by mouth once daily 3)  Zoloft 100 Mg Tabs (Sertraline hcl) .Marland Kitchen.. 1 by mouth daily 4)  Wellbutrin Xl 300 Mg Tb24 (Bupropion hcl) .... Once daily 5)  Zyrtec-d Allergy & Congestion 5-120 Mg Tb12 (Cetirizine-pseudoephedrine) .... Take 1 tablet by mouth twice a day 6)  Tums Ultra 1000 1000 Mg Chew (Calcium carbonate antacid) .... As needed with meals 7)  Proair Hfa 108 (90 Base) Mcg/act Aers (Albuterol sulfate) .... 2 puffs q4 as needed wheezing   Patient Instructions: 1)  Saline nasal 2 puffs four times a day as needed  2)  May use pepcid 10mg  or  zantac 75mg  at bedtime as needed breakthrough reflux.  3)  follow up 4 months Dr. Vassie Loll  4)  Please contact office for sooner follow up if symptoms do not improve or worsen    Prescriptions: PROAIR HFA 108 (90 BASE) MCG/ACT  AERS (ALBUTEROL SULFATE) 2 puffs q4 as needed wheezing Brand medically necessary #1 x 2   Entered and Authorized by:   Rubye Oaks NP   Signed by:   Rubye Oaks NP on 06/13/2008   Method used:   Electronically to        [facility]       [address]       [place], [place]  [address]       Ph: [phone]       Fax: [phone]   RxID:   [id number]  ]

## 2010-08-07 NOTE — Progress Notes (Signed)
   Phone Note Outgoing Call   Call placed by: Ardyth Man,  January 05, 2007 3:00 PM Call placed to: Patient Summary of Call: Spoke with pt. is still taking the Mucinex and coughing.  Per Dr. Laqueta Linden notes, pt. is aware to see asthma specialist and is okay with this. ...................................................................Ardyth Man  January 05, 2007 3:01 PM  Initial call taken by: Ardyth Man,  January 05, 2007 3:01 PM

## 2010-08-07 NOTE — Progress Notes (Signed)
Summary: Phone note  Phone Note Call from Patient   Summary of Call: Patient has a hematoma from a fall. Patient states it doesn't look any better and still hurts a little. Patient wants to know what she should do? Patietn can be reached at [phone]. Initial call taken by: Darra Lis RMA,  October 17, 2008 9:15 AM  Follow-up for Phone Call        refer to Surgery for a persistent painful hematoma on arm Follow-up by: Nelwyn Salisbury MD,  October 17, 2008 1:20 PM  Additional Follow-up for Phone Call Additional follow up Details #1::        Phone Call Completed Additional Follow-up by: Alfred Levins, CMA,  October 17, 2008 1:27 PM

## 2010-08-07 NOTE — Assessment & Plan Note (Signed)
Summary: congestion/tightness in chest/apc   Primary Provider/Referring Provider:  Blossom Hoops   History of Present Illness: 45 year old female with known history of Asthma since childhood, worse during & after pregnancy, previously well controlled on Symbicort. Hx of severe reflux for many yrs, improved  after Nissen's 2 yr ago  02/08/08-- Pt woke up choking in middle of night, since then throat feels raw, has had dry cough, and felt wheezy >> steroid exacerbation.  9/1>> much improved, but c/o chest tightness, spirometry nml, FEv1 99% Breakthrough reflux 4-5/wk, takes tums every night with ranitidine; reglan did not work (dysmotility noted on studies by Dr. Daphine Deutscher post Nissen's). -describes wt gain 60 lbs over last 5 yrs with zoloft & wellbutrin   June 13, 2008--returns today for follow up. Improved with less chest tightness, using proAir 1-2 x/wk. Now on omeprazole 20mg  once daily. less reflux.   September 13, 2008--acute sick visit today. Pt has been sick for 5 days with nasal congestion, productive cough with yellow sputum, raspy voice, SOB with exertion, wheezing, sore throat.  Pt is utilizing her Advair daily, and has been utlizing her Albuterol inhaler regularly every 4- 6 hours for SOB and wheezing.  Five days ago, she was exposd to cold night air and dust.  Denies HA, dizziness, chest pain, fever, blood in sputum, nausea, vomiting, diarrhea.  Preventive Screening-Counseling & Management     Smoking Status: never  Medications Prior to Update: 1)  Advair Diskus 250-50 Mcg/dose Misc (Fluticasone-Salmeterol) .Marland Kitchen.. 1 Puff Two Times A Day 2)  Omeprazole 20 Mg Cpdr (Omeprazole) .Marland Kitchen.. 1 By Mouth Once Daily 3)  Zoloft 100 Mg Tabs (Sertraline Hcl) .Marland Kitchen.. 1 By Mouth Daily 4)  Wellbutrin Xl 300 Mg  Tb24 (Bupropion Hcl) .... Once Daily 5)  Zyrtec-D Allergy & Congestion 5-120 Mg Tb12 (Cetirizine-Pseudoephedrine) .... Take 1 Tablet By Mouth Twice A Day 6)  Tums Ultra 1000 1000 Mg  Chew (Calcium  Carbonate Antacid) .... As Needed With Meals 7)  Proair Hfa 108 (90 Base) Mcg/act  Aers (Albuterol Sulfate) .... 2 Puffs Q4 As Needed Wheezing  Allergies (verified): 1)  ! Penicillin 2)  ! Sulfa  Review of Systems      See HPI  Vital Signs:  Patient profile:   45 year old female Height:      62 inches Weight:      194.50 pounds BMI:     35.70 O2 Sat:      95 % Temp:     97.1 degrees F oral Pulse rate:   109 / minute BP sitting:   108 / 80  (left arm) Cuff size:   regular  Vitals Entered By: Boone Master CNA (September 13, 2008 9:34 AM)  O2 Sat at Rest %:  95 O2 Flow:  room air Is Patient Diabetic? No Comments Medications reviewed with patient. Boone Master CNA  September 13, 2008 9:44 AM    Physical Exam  Additional Exam:  GEN: A/Ox3; pleasant , NAD HEENT:  [place]/AT, , EACs-clear, TMs-wnl, NOSE-reddened and slightly edema, THROAT-reddened, clear drainage visible oropharynx NECK:  Supple w/ fair ROM; no JVD; normal carotid impulses w/o bruits; no thyromegaly or nodules palpated; no lymphadenopathy. CHEST:  Bilateral wheezes in all lobes upon auscultation, w/o rales/ or rhonchi. HEART:  RRR, no m/r/g   ABDOMEN:  Soft & nt; nml bowel sounds; no organomegaly or masses detected. EXT: Warm bil,  no calf pain, edema, clubbing, pulses intact     Pulmonary Function Test  Height (in.): 62 Gender: Female  Impression & Recommendations:  Problem # 1:  ASTHMATIC BRONCHITIS, ACUTE (ICD-466.0)  Administered Xopenex neb treatment prior to pt leaving office Prescribed Prednisone taper, Mucinex DM, Z pak - take as directed, Promethazine-codeine cough syrup 1-2 tsp every 4-6 hr as needed cough and recommended saline nasal rinses  Her updated medication list for this problem includes:    Advair Diskus 250-50 Mcg/dose Misc (Fluticasone-salmeterol) .Marland Kitchen... 1 puff two times a day    Zyrtec-d Allergy & Congestion 5-120 Mg Tb12 (Cetirizine-pseudoephedrine) .Marland Kitchen... Take 1 tablet by mouth twice a  day    Proair Hfa 108 (90 Base) Mcg/act Aers (Albuterol sulfate) .Marland Kitchen... 2 puffs q4 as needed wheezing    Zithromax Z-pak 250 Mg Tabs (Azithromycin) .Marland Kitchen... Take as directed    Promethazine-codeine 6.25-10 Mg/64ml Syrp (Promethazine-codeine) .Marland Kitchen... 1-2  tsp every 4-6 hr as needed cough  Take antibiotics and other medications as directed. Encouraged to push clear liquids, get enough rest, and take acetaminophen as needed. To be seen in 5-7 days if no improvement, sooner if worse.  Orders: Est. Patient Level III ([address])  Medications Added to Medication List This Visit: 1)  Zithromax Z-pak 250 Mg Tabs (Azithromycin) .... Take as directed 2)  Prednisone 10 Mg Tabs (Prednisone) .... 4 tabs for 2 days, then 3 tabs for 2 days, 2 tabs for 2 days, then 1 tab for 2 days, then stop 3)  Promethazine-codeine 6.25-10 Mg/2ml Syrp (Promethazine-codeine) .Marland Kitchen.. 1-2  tsp every 4-6 hr as needed cough  Complete Medication List: 1)  Advair Diskus 250-50 Mcg/dose Misc (Fluticasone-salmeterol) .Marland Kitchen.. 1 puff two times a day 2)  Omeprazole 20 Mg Cpdr (Omeprazole) .Marland Kitchen.. 1 by mouth once daily 3)  Zoloft 100 Mg Tabs (Sertraline hcl) .Marland Kitchen.. 1 by mouth daily 4)  Wellbutrin Xl 300 Mg Tb24 (Bupropion hcl) .... Once daily 5)  Zyrtec-d Allergy & Congestion 5-120 Mg Tb12 (Cetirizine-pseudoephedrine) .... Take 1 tablet by mouth twice a day 6)  Tums Ultra 1000 1000 Mg Chew (Calcium carbonate antacid) .... As needed with meals 7)  Proair Hfa 108 (90 Base) Mcg/act Aers (Albuterol sulfate) .... 2 puffs q4 as needed wheezing 8)  Zithromax Z-pak 250 Mg Tabs (Azithromycin) .... Take as directed 9)  Prednisone 10 Mg Tabs (Prednisone) .... 4 tabs for 2 days, then 3 tabs for 2 days, 2 tabs for 2 days, then 1 tab for 2 days, then stop 10)  Promethazine-codeine 6.25-10 Mg/74ml Syrp (Promethazine-codeine) .Marland Kitchen.. 1-2  tsp every 4-6 hr as needed cough  Patient Instructions: 1)  Z pak - follow instructions per package - 2 tablets first day by mouth then 1  tablet every day until finished 2)  Prednisone taper - follow instructions on bottle 3)  Saline nasal rinses as needed  4)  Mucinex DM two times a day as needed cough/congestion 5)  Phenergan w/ codeine 1-2 tsp every 4-6 hr as needed cough 6)  follow up Dr. Vassie Loll in 1 month  7)  Please contact office for sooner follow up if symptoms do not improve or worsen  8)  The medication list was reviewed and reconciled.  All changed / newly prescribed medications were explained.  A complete medication list was provided to the patient / caregiver.  Prescriptions: PROAIR HFA 108 (90 BASE) MCG/ACT  AERS (ALBUTEROL SULFATE) 2 puffs q4 as needed wheezing Brand medically necessary #1 x 2   Entered and Authorized by:   Rubye Oaks NP   Signed by:   Rubye Oaks NP  on 09/13/2008   Method used:   Electronically to        [facility]       [address]       [place], [place]  [address]       Ph: [phone]       Fax: [phone]   RxID:   [id number] PROMETHAZINE-CODEINE 6.25-10 MG/5ML SYRP (PROMETHAZINE-CODEINE) 1-2  tsp every 4-6 hr as needed cough  #8 oz x 0   Entered and Authorized by:   Rubye Oaks NP   Signed by:   Tammy Parrett NP on 09/13/2008   Method used:   Print then Give to Patient   RxID:   [id number] PREDNISONE 10 MG TABS (PREDNISONE) 4 tabs for 2 days, then 3 tabs for 2 days, 2 tabs for 2 days, then 1 tab for 2 days, then stop  #20 x 0   Entered and Authorized by:   Rubye Oaks NP   Signed by:   Rubye Oaks NP on 09/13/2008   Method used:   Electronically to        [facility]       [address]       [place], [place]  [address]       Ph: [phone]       Fax: [phone]   RxID:   [id number] ZITHROMAX Z-PAK 250 MG TABS (AZITHROMYCIN) take as directed  #1 x 0   Entered and Authorized by:   Rubye Oaks NP   Signed by:   Rubye Oaks NP on 09/13/2008   Method  used:   Electronically to        [facility]       [address]       [place], [place]  [address]       Ph: [phone]       Fax: [phone]   RxID:   [id number]

## 2010-08-07 NOTE — Progress Notes (Signed)
Summary: nausea, weird feeling head x 3 weeks  Phone Note Call from Patient Call back at [phone]   Caller: live Call For: Egbert Seidel Summary of Call: Lots of questions about withdrawal from Zoloft, can it cause nausea & weird feeling head after 3 weeks, would playing tennis in sun yesterday 2 hrs. cause this to be worse, can she get labs.  Gave workin appt Mon & she'll copme fasting if she can get labs, if not will get them scheduled.    Initial call taken by: Rudy Jew, RN,  December 23, 2008 3:55 PM  Follow-up for Phone Call        noted Follow-up by: Nelwyn Salisbury MD,  December 23, 2008 4:02 PM

## 2010-08-07 NOTE — Letter (Signed)
Summary: [facility]  [facility]   Imported By: Job Founds 01/26/2007 14:30:43  _____________________________________________________________________  External Attachment:    Type:   Image     Comment:   External Document

## 2010-08-07 NOTE — Progress Notes (Signed)
----   Converted from flag ---- ---- 12/26/2006 4:48 PM, Ardyth Man wrote: Dr. Blossom Hoops:  [facility] called the office for Beatriz Stallion and everything was normal, no active disease.  MU,CMA ------------------------------

## 2010-08-07 NOTE — Assessment & Plan Note (Signed)
Summary: CONGESTION / COLD SYMPTOMS // RS   Vital Signs:  Patient profile:   45 year old female Weight:      173 pounds O2 Sat:      98 % on Room air Temp:     98.3 degrees F oral Pulse rate:   105 / minute BP sitting:   112 / 74  (left arm) Cuff size:   large  Vitals Entered By: Alfred Levins, CMA (May 29, 2009 11:11 AM)  O2 Flow:  Room air CC: cough, sob, congestion   History of Present Illness: Here for one week of stuufy head, PND, St, chest congestion, and dry cough. using her nebulizer some. No fever.   Allergies: 1)  ! Penicillin 2)  ! Sulfa  Past History:  Past Medical History: Reviewed history from 03/08/2008 and no changes required. Allergic rhinitis Asthma, sees Dr. Cyril Mourning Depression, sees [facility] and Dr. Nolen Mu GERD UTI's sees Debbora Dus NP or Dr. Cherly Hensen for gyn exams normal sleep study 12-18-06  Past Surgical History: Reviewed history from 12/15/2007 and no changes required. Nissen fundiplication 1-07 per Dr. Renea Ee hernia repairs (2) in 1-09 per Dr. Daphine Deutscher Caesarean sections in 2001 and 2003 Cholecystectomy Tonsillectomy  Review of Systems  The patient denies anorexia, fever, weight loss, weight gain, vision loss, decreased hearing, hoarseness, chest pain, syncope, dyspnea on exertion, peripheral edema, headaches, hemoptysis, abdominal pain, melena, hematochezia, severe indigestion/heartburn, hematuria, incontinence, genital sores, muscle weakness, suspicious skin lesions, transient blindness, difficulty walking, depression, unusual weight change, abnormal bleeding, enlarged lymph nodes, angioedema, breast masses, and testicular masses.    Physical Exam  General:  Well-developed,well-nourished,in no acute distress; alert,appropriate and cooperative throughout examination Head:  Normocephalic and atraumatic without obvious abnormalities. No apparent alopecia or balding. Eyes:  No corneal or conjunctival inflammation noted.  EOMI. Perrla. Funduscopic exam benign, without hemorrhages, exudates or papilledema. Vision grossly normal. Ears:  External ear exam shows no significant lesions or deformities.  Otoscopic examination reveals clear canals, tympanic membranes are intact bilaterally without bulging, retraction, inflammation or discharge. Hearing is grossly normal bilaterally. Nose:  External nasal examination shows no deformity or inflammation. Nasal mucosa are pink and moist without lesions or exudates. Mouth:  Oral mucosa and oropharynx without lesions or exudates.  Teeth in good repair. Neck:  No deformities, masses, or tenderness noted. Lungs:  Normal respiratory effort, chest expands symmetrically. Lungs are clear to auscultation, no crackles or wheezes.   Impression & Recommendations:  Problem # 1:  ASTHMATIC BRONCHITIS, ACUTE (ICD-466.0)  Her updated medication list for this problem includes:    Advair Diskus 250-50 Mcg/dose Misc (Fluticasone-salmeterol) .Marland Kitchen... 1 puff two times a day    Zyrtec-d Allergy & Congestion 5-120 Mg Tb12 (Cetirizine-pseudoephedrine) .Marland Kitchen... Take 1 tablet by mouth twice a day    Proair Hfa 108 (90 Base) Mcg/act Aers (Albuterol sulfate) .Marland Kitchen... 2 puffs q4 as needed wheezing    Zithromax Z-pak 250 Mg Tabs (Azithromycin) .Marland Kitchen... As directed  Complete Medication List: 1)  Advair Diskus 250-50 Mcg/dose Misc (Fluticasone-salmeterol) .Marland Kitchen.. 1 puff two times a day 2)  Omeprazole 20 Mg Cpdr (Omeprazole) .Marland Kitchen.. 1 by mouth once daily 3)  Zyrtec-d Allergy & Congestion 5-120 Mg Tb12 (Cetirizine-pseudoephedrine) .... Take 1 tablet by mouth twice a day 4)  Tums Ultra 1000 1000 Mg Chew (Calcium carbonate antacid) .... As needed with meals 5)  Proair Hfa 108 (90 Base) Mcg/act Aers (Albuterol sulfate) .... 2 puffs q4 as needed wheezing 6)  Imitrex 100 Mg Tabs (Sumatriptan  succinate) .... As needed for ha 7)  Zithromax Z-pak 250 Mg Tabs (Azithromycin) .... As directed 8)  Prednisone (pak) 10 Mg Tabs  (Prednisone) .... As directed for 6 days  Patient Instructions: 1)  Please schedule a follow-up appointment as needed .  Prescriptions: PREDNISONE (PAK) 10 MG TABS (PREDNISONE) as directed for 6 days  #1 x 0   Entered and Authorized by:   Nelwyn Salisbury MD   Signed by:   Nelwyn Salisbury MD on 05/29/2009   Method used:   Electronically to        [facility]  [phone]* (retail)       [address] [place], [place]  [address]       Ph: [phone] or [phone]       Fax: [phone]   RxID:   [id number] ZITHROMAX Z-PAK 250 MG TABS (AZITHROMYCIN) as directed  #1 x 0   Entered and Authorized by:   Nelwyn Salisbury MD   Signed by:   Nelwyn Salisbury MD on 05/29/2009   Method used:   Electronically to        [facility]  [phone]* (retail)       [address] [place], [place]  [address]       Ph: [phone] or [phone]       Fax: [phone]   RxID:   [id number]

## 2010-08-07 NOTE — Assessment & Plan Note (Signed)
Summary: NP follow up - seasonal allergies   Primary Provider/Referring Provider:  Nelwyn Salisbury MD  CC:  Asthma follow up .  History of Present Illness: 44/F, with known history of Asthma since childhood, worse during & after pregnancy 2001.  Triggers - GERD vs allergies Hx of severe reflux for many yrs, improved  after Nissen's 2 yr ago 02/08/08-- Pt woke up choking in middle of night, since then throat feels raw, has had dry cough, and felt wheezy >> steroids  9/1>> much improved, but c/o chest tightness, spirometry nml, FEv1 99% Breakthrough reflux 4-5/wk, takes tums every night with ranitidine; reglan did not work (dysmotility noted on studies by Dr. Daphine Deutscher post Nissen's in 2008). -describes wt gain 60 lbs over last 5 yrs with zoloft & wellbutrin   2 exacerbations in 2010 requiring steroids.  Nov 06, 2009 9:23 AM  [facility] has stopped zoloft/wellbutrin and has lost 30lbs. GERD under good control - off omeprazole, on tums twice/ month.    January 22, 2010--Presents for work in visit. Complains of occasional tightness in left side of chest  for 4 weeks. Notices on/off, mainly when she is stressed, overwhelmed. no associated dyspnea, radiating pain, diaphoresis. >>neg EKG, tx for chest wall pain.   May 14, 2010 --Presents for follow up of ashtma. Over last few months had admission for dehydration with no known sequelae. Workup for lower abd pain w/ neg colonoscopy w/ [facility]. Recommended for GERD diet and probiotics. Reports GERD is under great control w/ diet. No asthma flare , no steroid use x 1 year. no wheezing or nocturnal awakenings. She does want to change back to Symbicort --liked it better. We discussed decresing dose of ICS however she declined to do this b/c winter months are coming up. We will discuss at upcoming follow up in 4 months if still doing well. Denies chest pain, dyspnea, orthopnea, hemoptysis, fever, n/v/d, edema, headache.   Medications Prior to Update: 1)  Advair  Diskus 250-50 Mcg/dose Misc (Fluticasone-Salmeterol) .Marland Kitchen.. 1 Puff Two Times A Day 2)  Zyrtec-D Allergy & Congestion 5-120 Mg Tb12 (Cetirizine-Pseudoephedrine) .... Take 1 Tablet By Mouth Once A Day 3)  Tums Ultra 1000 1000 Mg  Chew (Calcium Carbonate Antacid) .... As Needed With Meals 4)  Proair Hfa 108 (90 Base) Mcg/act  Aers (Albuterol Sulfate) .... 2 Puffs Q4 As Needed Wheezing  Current Medications (verified): 1)  Advair Diskus 250-50 Mcg/dose Misc (Fluticasone-Salmeterol) .Marland Kitchen.. 1 Puff Two Times A Day 2)  Zyrtec-D Allergy & Congestion 5-120 Mg Tb12 (Cetirizine-Pseudoephedrine) .... Take 1 Tablet By Mouth Once A Day 3)  Tums Ultra 1000 1000 Mg  Chew (Calcium Carbonate Antacid) .... As Needed With Meals 4)  Proair Hfa 108 (90 Base) Mcg/act  Aers (Albuterol Sulfate) .... 2 Puffs Q4 As Needed Wheezing  Allergies: 1)  ! Penicillin 2)  ! Sulfa  Past History:  Past Medical History: Last updated: 01/30/2010 Allergic rhinitis Asthma, sees Dr. Cyril Mourning Depression, sees [facility] and Dr. Nolen Mu GERD UTI's sees Debbora Dus NP or Dr. Cherly Hensen for gyn exams normal sleep study 12-18-06 Anxiety Disorder  Past Surgical History: Last updated: 01/24/2010 Nissen fundiplication 1-07 per Dr. Renea Ee hernia repairs (2) in 1-09 per Dr. 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 Daily Caffeine Use  2 declines flu shot 05-14-10  Risk Factors: Smoking Status: never (09/13/2008)  Social History: Married Never Smoked Alcohol use-no 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 No Comments Medications reviewed with patient Daytime contact number verified with patient. Boone Master CNA/MA  May 14, 2010 9:38 AM    Physical Exam  Additional Exam:  GEN: A/Ox3; pleasant , NAD HEENT:  [place]/AT, , EACs-clear, TMs-wnl, NOSE-clear, THROAT-clear NECK:  Supple w/ fair ROM; no JVD; normal carotid impulses w/o bruits; no thyromegaly or nodules palpated; no lymphadenopathy. RESP  Clear to P & A; w/o, wheezes/ rales/ or rhonchi. CARD:  RRR, no m/r/g   GI:   Soft & nt; nml bowel sounds; no organomegaly or masses detected. [place]co: Warm bil,  no calf tenderness edema, clubbing, pulses intact Neuro: intact w/ no focal deficits noted.    Impression & Recommendations:  Problem # 1:  ASTHMA (ICD-493.90) Welll  controlled. per pt request wants to change back to symbicort  advised on triggers  reevaluate on return to decrease ICS dose.  Plan:  May switch to Symbicort 160/4.26mcg 2 puffs two times a day  Brush rinse and gargle after inhaler use.  Stop Advair  GERD diet.  follow up Dr. Vassie Loll in 4 months and as needed   Problem # 2:  GERD (ICD-530.81) controlled on GERD diet.  Her updated medication list for this problem includes:    Tums Ultra 1000 1000 Mg Chew (Calcium carbonate antacid) .Marland Kitchen... As needed with meals  Orders: Est.  Patient Level III (81[address])  Problem # 3:  ALLERGIC RHINITIS (ICD-477.9) controlled on on rx  Orders: Est. Patient Level III (47829)  Medications Added to Medication List This Visit: 1)  Symbicort 160-4.5 Mcg/act Aero (Budesonide-formoterol fumarate) .... 2 puffs two times a day  brush, rinse and gargle  Complete Medication List: 1)  Symbicort 160-4.5 Mcg/act Aero (Budesonide-formoterol fumarate) .... 2 puffs two times a day  brush, rinse and gargle 2)  Zyrtec-d Allergy & Congestion 5-120 Mg Tb12 (Cetirizine-pseudoephedrine) .... Take 1 tablet by mouth once a day 3)  Tums Ultra 1000 1000 Mg Chew (Calcium carbonate antacid) .... As needed with meals 4)  Proair Hfa 108 (90 Base) Mcg/act Aers (Albuterol sulfate) .... 2 puffs q4 as needed wheezing  Patient Instructions: 1)  May switch to Symbicort 160/4.34mcg 2 puffs two times a day  2)  Brush rinse and gargle after inhaler use.  3)  Stop Advair  4)  GERD diet.  5)  follow up Dr.  Alva in 4 months and as needed  Prescriptions: PROAIR HFA 108 (90 BASE) MCG/ACT  AERS (ALBUTEROL SULFATE) 2 puffs q4 as needed wheezing Brand medically necessary #1 x 2   Entered and Authorized by:   Rubye Oaks NP   Signed by:   Rubye Oaks NP on 05/14/2010   Method used:   Electronically to        [facility] (retail)       [address]       [place], [place]  [address]       Ph: [phone]       Fax: [phone]   RxID:   [id number] SYMBICORT 160-4.5 MCG/ACT AERO (BUDESONIDE-FORMOTEROL FUMARATE) 2 puffs two times a day  brush, rinse and gargle  #1 x 6   Entered and Authorized by:   Rubye Oaks NP   Signed by:   Rubye Oaks NP on 05/14/2010   Method used:   Electronically to        [facility] (retail)       [address]       [place], [place]  [address]       Ph: [phone]       Fax: [phone]   RxID:   [id number]

## 2010-08-07 NOTE — Assessment & Plan Note (Signed)
Summary: fu from er/pt fell/njr   Vital Signs:  Patient profile:   45 year old female Weight:      198 pounds Temp:     97.9 degrees F oral Pulse rate:   121 / minute BP sitting:   114 / 80  (left arm) Cuff size:   large  Vitals Entered By: Alfred Levins, CMA (October 06, 2008 10:35 AM) CC: fell down on sunday, f/u from er   History of Present Illness: Here to follow up on injuries sustained on 10-02-08 when she tripped on her pajamas and fell down some steps at home. Bruised her right breast, chest, abdomen, arms, and legs. There were no fractures. Since then she has slowly gotten better. Took Percocet for a few days, but now is only taking Advil. Alternates heat and cold to various areas. Stiff and sore, but no other complaints. fortunately there was no head injury.   Allergies: 1)  ! Penicillin 2)  ! Sulfa  Past History:  Past Medical History:    Reviewed history from 03/08/2008 and no changes required:    Allergic rhinitis    Asthma, sees Dr. Cyril Mourning    Depression, sees [facility] and Dr. Nolen Mu    GERD    UTI's    sees Debbora Dus NP or Dr. Cherly Hensen for gyn exams    normal sleep study 12-18-06  Past Surgical History:    Reviewed history from 12/15/2007 and no changes required:    Nissen fundiplication 1-07 per Dr. Renea Ee hernia repairs (2) in 1-09 per Dr. Daphine Deutscher    Caesarean sections in 2001 and 2003    Cholecystectomy    Tonsillectomy  Review of Systems  The patient denies anorexia, fever, weight loss, weight gain, vision loss, decreased hearing, hoarseness, chest pain, syncope, dyspnea on exertion, peripheral edema, prolonged cough, headaches, hemoptysis, abdominal pain, melena, hematochezia, severe indigestion/heartburn, hematuria, incontinence, genital sores, muscle weakness, suspicious skin lesions, transient blindness, difficulty walking, depression, unusual weight change, abnormal bleeding, enlarged lymph nodes, angioedema, breast masses, and  testicular masses.    Physical Exam  General:  Well-developed,well-nourished,in no acute distress; alert,appropriate and cooperative throughout examination Msk:  extensive ecchymoses over back, chest, abdomen, arms, and legs. Back and neck have full ROM.    Impression & Recommendations:  Problem # 1:  CONTUSION OF FOREARM (ICD-923.10)  Problem # 2:  CONTUSION OF BREAST (ICD-922.0)  Problem # 3:  CONTUSION OF BACK (ICD-922.31)  Complete Medication List: 1)  Advair Diskus 250-50 Mcg/dose Misc (Fluticasone-salmeterol) .Marland Kitchen.. 1 puff two times a day 2)  Omeprazole 20 Mg Cpdr (Omeprazole) .Marland Kitchen.. 1 by mouth once daily 3)  Zoloft 100 Mg Tabs (Sertraline hcl) .... 2 by mouth daily 4)  Wellbutrin Xl 300 Mg Tb24 (Bupropion hcl) .... Once daily 5)  Zyrtec-d Allergy & Congestion 5-120 Mg Tb12 (Cetirizine-pseudoephedrine) .... Take 1 tablet by mouth twice a day 6)  Tums Ultra 1000 1000 Mg Chew (Calcium carbonate antacid) .... As needed with meals 7)  Proair Hfa 108 (90 Base) Mcg/act Aers (Albuterol sulfate) .... 2 puffs q4 as needed wheezing  Patient Instructions: 1)  Please schedule a follow-up appointment as needed .

## 2010-08-07 NOTE — Procedures (Signed)
Summary: Colonoscopy  Patient: Candies Palm Note: All result statuses are Final unless otherwise noted.  Tests: (1) Colonoscopy (COL)   COL Colonoscopy           DONE     [facility]     [address]     [place], [place]  [address]           COLONOSCOPY PROCEDURE REPORT           PATIENT:  Manmeet, Arzola  MR#:  [id number]     BIRTHDATE:  1965/12/26, 44 yrs. old  GENDER:  female     ENDOSCOPIST:  Hedwig Morton. Juanda Chance, MD     REF. BY:  Tera Mater. Clent Ridges, M.D.     PROCEDURE DATE:  03/14/2010     PROCEDURE:  Colonoscopy [id number]     ASA CLASS:  Class I     INDICATIONS:  11/2009 abnormal CT scan, irregular cecum, and     mesenteric adenopathy,     sister with Crohn's disease     had abd. pain which is much better without treatment     MEDICATIONS:   Versed 10 mg, Fentanyl 75 mcg           DESCRIPTION OF PROCEDURE:   After the risks benefits and     alternatives of the procedure were thoroughly explained, informed     consent was obtained.  Digital rectal exam was performed and     revealed no rectal masses.   The [id number] endoscope was     introduced through the anus and advanced to the cecum, which was     identified by both the appendix and ileocecal valve, without     limitations.  The quality of the prep was excellent, using     MiraLax.  The instrument was then slowly withdrawn as the colon     was fully examined.     <<PROCEDUREIMAGES>>           FINDINGS:  No polyps or cancers were seen (see image1, image3,     image4, image5, and image8).   Retroflexed views in the rectum     revealed no abnormalities.    The scope was then withdrawn from     the patient and the procedure completed.           COMPLICATIONS:  None     ENDOSCOPIC IMPRESSION:     1) No polyps or cancers     2) Normal colonoscopy     RECOMMENDATIONS:     1) high fiber diet     REPEAT EXAM:  In 10 year(s) for.           ______________________________     Hedwig Morton. Juanda Chance, MD           CC:             n.     eSIGNED:   Hedwig Morton. Brodie at 03/14/2010 02:15 PM           Beatriz Stallion, [id number]  Note: An exclamation mark (!) indicates a result that was not dispersed into the flowsheet. Document Creation Date: 03/14/2010 2:15 PM _______________________________________________________________________  (1) Order result status: Final Collection or observation date-time: 03/14/2010 14:01 Requested date-time:  Receipt date-time:  Reported date-time:  Referring Physician:   Ordering Physician: Lina Sar [phone]) Specimen Source:  Source: Launa Grill Order Number: [id number] Lab site:   Appended Document: Colonoscopy    Clinical Lists Changes  Observations:  Added new observation of COLONNXTDUE: 03/2020 (03/14/2010 15:19)

## 2010-08-07 NOTE — Op Note (Signed)
Summary: Esophageal Manometry  NAME:  DANA, DEBO              ACCOUNT NO.:  [id number]   MEDICAL RECORD NO.:  [id number]           PATIENT TYPE:   LOCATION:                                 FACILITY:   PHYSICIAN:  Lina Sar, M.D. [facility]  DATE OF BIRTH:  Dec 23, 1965   DATE OF CONSULTATION:  DATE OF DISCHARGE:                                   CONSULTATION   PROCEDURE:  Esophageal manometry.   INDICATIONS:  This 45 year old white female with severe gastroesophageal  reflux is being evaluated for Nissen fundoplication.   RESULTS:  Manometric catheter placed through the naris and passed through  the esophagus into the stomach.  The patient has continued her Protonix on  the day of the procedure.   Lower esophageal sphincter is located at 34 cm from the nares, distal end at  42 cm due to the hiatal hernia.  Total LES length was 8 cm.  Intra-abdominal  LES was 5.5 cm due to hiatal hernia.  Lower esophageal sphincter was 18.5  mm, which is a normal pressure.  Normal LES is between 10-45 mmHg.  With  seven swallows, LES pressure relaxed 96%, which is normal.  Peristaltic  waves were measured throughout the proximal, mid and distal esophagus.  Out  of 10 swallows, all 10 contractions were propulsive.  The amplitude and  duration was normal with the exception of one isolated elevated amplitude in  proximal esophagus.  There were no repetitive or retrograde contractions.   Upper esophageal sphincter was normal and relaxed with swallowing to the  baseline.   IMPRESSION:  Essentially normal upper esophageal manometry with normal lower  esophageal sphincter pressure with relaxation as well as normal propulsive  peristalsis.   CONCLUSION:  There is no contraindication to a Nissen fundoplication.      Lina Sar, M.D. [facility]  Electronically Signed     DB/MEDQ  D:  07/18/2005  T:  07/19/2005  Job:  [id number]   cc:   [place] Daphine Deutscher, MD  [address]  [place]  [place]  [address]   Danice Goltz, M.D. [facility]  [address] [place], [place] [address]

## 2010-08-07 NOTE — Assessment & Plan Note (Signed)
Summary: Gastroenterology  JENISA MONTY MR#:  [id number] Page #  NAME:  [name] [name], [name] [name]  OFFICE NO:  [id number]  DATE:  07/09/05  DOB:  Dec 24, 2065  HISTORY OF PRESENT ILLNESS:  The patient is a 45 year old white female with severe gastroesophageal reflux we saw initially in August 2006 with intractable reflux and suspicion for aspiration.  She has been followed by Dr. Jayme Cloud because of asthmatic bronchitis.  She continues to have severe reflux of not only acid but also food particles.  She coughs and chokes.  She has tried different proton pump inhibitors.  Nexium caused diarrhea, Protonix currently 40 mg twice a day, prior to that AcipHex 20 mg p.o. b.i.d.  She continues to gain weight, mostly because of depression.  She has been on psychotropic agent, Zoloft 200 mg a day, and despite of me giving her weight control pills, she has gained almost 10 pounds since August.  Her usual weight was 130 to 140 pounds.  She is currently up to 185 pounds.  She is also hoarse and coughs at night.  There has been no dysphagia.  CT scan of the chest and abdomen shows a question of a soft tissue mass in the distal esophagus.  It was unclear whether this represented hiatal hernia or a mass.  The patient underwent upper endoscopy because of her epigastric pain, nausea, and vomiting in January 2006 by Dr. Loreta Ave and showed essentially normal anatomy except for diffuse gastritis.  The patient is quite teary eyed today because of her intractable reflux and failure of medical therapy.   PHYSICAL EXAMINATION:  Blood pressure 100/60, pulse 72 and weight 185 pounds.  Her lungs were clear to auscultation.  There were no rales or wheezes today.  Her voice was hoarse, but she did not cough.  The neck was supple without adenopathy.  Cor with normal S1, normal S2.  Abdomen was unremarkable.  It was somewhat obese but nontender.    IMPRESSION:  A 45 year old white female with severe reflux disease causing hoarseness as well as  laryngopharyngeal reflux and possible aspiration.  She has failed use of proton pump inhibitors.  We have put her on low-dose Reglan, which also has not helped.  Weight gain as well as possible incompetent lower esophageal sphincter may be a problem here.  Zoloft may be contributing to decreased motility as well as to increased weight.  She is a candidate for Nissen fundoplication.  Ideally, she should lose about 30 or 40 pounds, but she has not been able to do that for different reasons.    PLAN:  1.  We will repeat upper endoscopy to clarify the abnormalities on the CT scan.  2.  She will need barium swallow prior to surgical evaluation.  3.  Esophageal manometry. 4.  Switch to Nexium 40 mg p.o. b.i.d.  5.  Continue weight loss. 6.  Eventually refer for surgical consideration of antireflux procedure.        Hedwig Morton. Juanda Chance, M.D.  [id number] cc:  Dr. Danice Goltz       Dr. Leanne Chang D:  07/09/05; T:  ; Job [id number]

## 2010-08-07 NOTE — Assessment & Plan Note (Signed)
Summary: lower right quad pain//ccm   Vital Signs:  Patient profile:   45 year old female Weight:      156 pounds Temp:     98.8 degrees F oral BP sitting:   110 / 84  (left arm) Cuff size:   regular  Vitals Entered By: Raechel Ache, RN (Nov 22, 2009 10:48 AM) CC: C/o RLQ pain which radiates upwards sometimes. Had ovarian u/s in Jan and was normal.    History of Present Illness: Here for an intermittent pain thatr has been bothering her in the RLQ for about 10 months. It is never severe but can be quite bothersome.It may last a few minutes or a few hours, and she gets this almost every day.  It sometimes radiates up towards the epigastrium but usually centers in the RLQ. She has had a 50 lb weight loss in the past year, part of which she attributes to some dieting. No fevers or nausea. Appetite is normal. Her BMs are normal. Her urinations are normal. She mentioned this to [facility] during a GYN exam in January. The exam revealed no adnexal masses or tenderness, and they did a pelvic US which showed normal ovaries. She does have a sister with Crohn's disease.   Allergies: 1)  ! Penicillin 2)  ! Sulfa  Past History:  Past Medical History: Allergic rhinitis Asthma, sees Dr. Cyril Mourning Depression, sees [facility] and Dr. Nolen Mu GERD UTI's sees Debbora Dus NP or Dr. Cherly Hensen for gyn exams normal sleep study 12-18-06  Past Surgical History: Reviewed history from 12/15/2007 and no changes required. Nissen fundiplication 1-07 per Dr. Renea Ee hernia repairs (2) in 1-09 per Dr. Daphine Deutscher Caesarean sections in 2001 and 2003 Cholecystectomy Tonsillectomy  Family History: Reviewed history from 02/08/2008 and no changes required. Family History of Alcoholism/Addiction Family History of Arthritis Family History Breast cancer 1st degree relative <50 Family History of CAD Female 1st degree relative <50 Family History Hypertension   mother living with rheumatoid  arthritis father living with no medical history sister living with thyroid number sister with Crohn's disease  Physical Exam  General:  Well-developed,well-nourished,in no acute distress; alert,appropriate and cooperative throughout examination Neck:  No deformities, masses, or tenderness noted. Lungs:  Normal respiratory effort, chest expands symmetrically. Lungs are clear to auscultation, no crackles or wheezes. Heart:  Normal rate and regular rhythm. S1 and S2 normal without gallop, murmur, click, rub or other extra sounds. Abdomen:  soft, normal bowel sounds, no distention, no masses, no guarding, no rigidity, no rebound tenderness, no abdominal hernia, no inguinal hernia, no hepatomegaly, and no splenomegaly.  Mildly tender in the RLQ.    Impression & Recommendations:  Problem # 1:  ABDOMINAL PAIN, RIGHT LOWER QUADRANT (ICD-789.03)  Complete Medication List: 1)  Advair Diskus 250-50 Mcg/dose Misc (Fluticasone-salmeterol) .Marland Kitchen.. 1 puff two times a day 2)  Zyrtec-d Allergy & Congestion 5-120 Mg Tb12 (Cetirizine-pseudoephedrine) .... Take 1 tablet by mouth once a day 3)  Tums Ultra 1000 1000 Mg Chew (Calcium carbonate antacid) .... As needed with meals 4)  Proair Hfa 108 (90 Base) Mcg/act Aers (Albuterol sulfate) .... 2 puffs q4 as needed wheezing  Other Orders: UA Dipstick w/o Micro (automated)  (81003) Venipuncture (52841) TLB-Lipid Panel (80061-LIPID) TLB-BMP (Basic Metabolic Panel-BMET) (80048-METABOL) TLB-CBC Platelet - w/Differential (85025-CBCD) TLB-Hepatic/Liver Function Pnl (80076-HEPATIC) TLB-TSH (Thyroid Stimulating Hormone) (32440-NUU)  Patient Instructions: 1)  We will get labs today and set up a contrasted CT scan soon. I think an enteritis like  Crohn's is a real possibility, versus adhesions, etc.   Appended Document: lower right quad pain//ccm     Allergies: 1)  ! Penicillin 2)  ! Sulfa   Impression & Recommendations:  Problem # 1:  ABDOMINAL PAIN,  RIGHT LOWER QUADRANT (ICD-789.03)  Orders: Venipuncture (72536) TLB-Lipid Panel (80061-LIPID) TLB-BMP (Basic Metabolic Panel-BMET) (80048-METABOL) TLB-CBC Platelet - w/Differential (85025-CBCD) TLB-Hepatic/Liver Function Pnl (80076-HEPATIC) TLB-TSH (Thyroid Stimulating Hormone) (84443-TSH) UA Dipstick w/o Micro (automated)  (81003) Radiology Referral (Radiology)  Complete Medication List: 1)  Advair Diskus 250-50 Mcg/dose Misc (Fluticasone-salmeterol) .Marland Kitchen.. 1 puff two times a day 2)  Zyrtec-d Allergy & Congestion 5-120 Mg Tb12 (Cetirizine-pseudoephedrine) .... Take 1 tablet by mouth once a day 3)  Tums Ultra 1000 1000 Mg Chew (Calcium carbonate antacid) .... As needed with meals 4)  Proair Hfa 108 (90 Base) Mcg/act Aers (Albuterol sulfate) .... 2 puffs q4 as needed wheezing  Appended Document: lower right quad pain//ccm  Laboratory Results   Urine Tests    Routine Urinalysis   Color: yellow Appearance: Clear Glucose: negative   (Normal Range: Negative) Bilirubin: negative   (Normal Range: Negative) Ketone: negative   (Normal Range: Negative) Spec. Gravity: <1.005   (Normal Range: 1.003-1.035) Blood: negative   (Normal Range: Negative) pH: 5.5   (Normal Range: 5.0-8.0) Protein: negative   (Normal Range: Negative) Urobilinogen: 0.2   (Normal Range: 0-1) Nitrite: negative   (Normal Range: Negative) Leukocyte Esterace: negative   (Normal Range: Negative)    Comments: Rita Ohara  Nov 22, 2009 11:39 AM

## 2010-08-07 NOTE — Assessment & Plan Note (Signed)
Summary: 4-6 months/apc   Primary Provider/Referring Provider:  Nelwyn Salisbury MD  CC:  Pt here for routine check up. Pt c/o intermittent chest discomfort  x 2 to 3 days .  History of Present Illness: 43/F, with known history of Asthma since childhood, worse during & after pregnancy 2001.  Triggers - GERD vs allergies Hx of severe reflux for many yrs, improved  after Nissen's 2 yr ago 02/08/08-- Pt woke up choking in middle of night, since then throat feels raw, has had dry cough, and felt wheezy >> steroids  9/1>> much improved, but c/o chest tightness, spirometry nml, FEv1 99% Breakthrough reflux 4-5/wk, takes tums every night with ranitidine; reglan did not work (dysmotility noted on studies by Dr. Daphine Deutscher post Nissen's in 2008). -describes wt gain 60 lbs over last 5 yrs with zoloft & wellbutrin   2 exacerbations in 2010 requiring steroids.  Nov 06, 2009 9:23 AM  [facility] has stopped zoloft/wellbutrin and has lost 30lbs. GERD under good control - off omeprazole, on tums twice/ month. . No hemoptysis, never smoker., no exertional chest pain, diaphoresis, not related to food, no overt reflux/bloating or gas. no rash. Denies  dyspnea, orthopnea, hemoptysis, fever, n/v/d, edema, headache.   Current Medications (verified): 1)  Advair Diskus 250-50 Mcg/dose Misc (Fluticasone-Salmeterol) .Marland Kitchen.. 1 Puff Two Times A Day 2)  Zyrtec-D Allergy & Congestion 5-120 Mg Tb12 (Cetirizine-Pseudoephedrine) .... Take 1 Tablet By Mouth Once A Day 3)  Tums Ultra 1000 1000 Mg  Chew (Calcium Carbonate Antacid) .... As Needed With Meals 4)  Proair Hfa 108 (90 Base) Mcg/act  Aers (Albuterol Sulfate) .... 2 Puffs Q4 As Needed Wheezing  Allergies (verified): 1)  ! Penicillin 2)  ! 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  Nolen Mu GERD UTI's sees Debbora Dus NP or Dr. Cherly Hensen for gyn exams normal sleep study 12-18-06  Review of Systems  The patient denies anorexia, fever, weight loss, weight gain, vision loss, decreased hearing, hoarseness, chest pain, syncope, dyspnea on exertion, peripheral edema, prolonged cough, headaches, hemoptysis, abdominal pain, melena, hematochezia, severe indigestion/heartburn, hematuria, muscle weakness, suspicious skin lesions, difficulty walking, depression, unusual weight change, and abnormal bleeding.    Vital Signs:  Patient profile:   45 year old female Height:      62 inches Weight:      159.38 pounds O2 Sat:      97 % on Room air Temp:     98.1 degrees F oral Pulse rate:   83 / minute BP sitting:   108 / 70  (right arm) Cuff size:   regular  Vitals Entered By: Zackery Barefoot CMA (Nov 06, 2009 9:13 AM)  O2 Flow:  Room air CC: Pt here for routine check up. Pt c/o intermittent chest discomfort  x 2 to 3 days  Comments Medications reviewed with patient Verified contact number and pharmacy with patient Zackery Barefoot CMA  Nov 06, 2009 9:14 AM    Physical Exam  Additional Exam:  GEN: A/Ox3; pleasant , NAD HEENT:  [place]/AT, , EACs-clear, TMs-wnl, NOSE-reddened and slight edema, THROAT-clear NECK:  Supple w/ fair ROM; no JVD; normal carotid impulses w/o bruits; no thyromegaly or nodules palpated; no lymphadenopathy. CHEST:  CTA w/ no wheezing , mild tenderness along upper /lat chest wall , no eccymosis HEART:  RRR, no m/r/g   EXT:  Warm bil,  no calf pain, edema, clubbing, pulses intact     Impression & Recommendations:  Problem # 1:  ASTHMA (ICD-493.90) well controlled on advair 250/50 If remians so in 6 mnths, consider step down to 100/50  Problem # 2:  ALLERGIC RHINITIS (ICD-477.9)  stay on zyrtec d - perennial If able to come down to 100/50 & advair  >> singulair would be a good choice to target both  Orders: Est. Patient Level III  (16109) Prescription Created Electronically [phone])  Medications Added to Medication List This Visit: 1)  Zyrtec-d Allergy & Congestion 5-120 Mg Tb12 (Cetirizine-pseudoephedrine) .... Take 1 tablet by mouth once a day  Patient Instructions: 1)  Please schedule a follow-up appointment in 6 months with TP. 2)  If no sick visits until then, consider decreasing advair to 100/50 3)  Copy sent to: Dr Clent Ridges Prescriptions: ZYRTEC-D ALLERGY & CONGESTION 5-120 MG TB12 (CETIRIZINE-PSEUDOEPHEDRINE) Take 1 tablet by mouth once a day  #30 x 5   Entered and Authorized by:   Comer Locket Vassie Loll MD   Signed by:   Comer Locket Vassie Loll MD on 11/06/2009   Method used:   Electronically to        [facility]ail[facility]       [address]       [place], [place]  [address]       Ph: [phone]       Fax: [phone]   RxID:   [id number] PROAIR HFA 108 (90 BASE) MCG/ACT  AERS (ALBUTEROL SULFATE) 2 puffs q4 as needed wheezing Brand medically necessary #1 x 5   Entered and Authorized by:   Comer Locket. Vassie Loll MD   Signed by:   Comer Locket Vassie Loll MD on 11/06/2009   Method used:   Electronically to        [facility] ([facility])       [address]       [place], [place]  [address]       Ph: [phone]       Fax: [phone]   RxID:   [id number] ADVAIR DISKUS 250-50 MCG/DOSE MISC (FLUTICASONE-SALMETEROL) 1 puff two times a day  #1 x 5   Entered and Authorized by:   Comer Locket. Vassie Loll MD   Signed by:   Comer Locket Vassie Loll MD on 11/06/2009   Method used:   Electronically to        [facility] ([facility])       [address]       [place], [place]  [address]       Ph: [phone]       Fax: [phone]   RxID:   [id number]

## 2010-08-07 NOTE — Procedures (Signed)
Summary: EGD   EGD  Procedure date:  07/10/2005  Findings:      Location: [facility]   Patient Name: [name] [name], Forness MRN:  Procedure Procedures: Panendoscopy (EGD) CPT: 43235.    with biopsy(s)/brushing(s). CPT: D1846139.  Personnel: Endoscopist: Sondra Blixt L. Juanda Chance, MD.  Exam Location: Exam performed in Outpatient Clinic. Outpatient  Patient Consent: Procedure, Alternatives, Risks and Benefits discussed, consent obtained, from patient. Consent was obtained by the RN.  Indications  Abnormal Exams, Studies: CT scan, abnormal, do not suspect malignancy.  Symptoms: Pulmonary symptoms, including:  Cough. Hoarseness. Reflux symptoms for 1-5 yrs, occurring daily.  Surveillance of: Last exam: Jan, 2006.  Comments: REFRACTORY TO HIGH DOSE ppi'S, considering Nissen Fundoplication History  Current Medications: Patient is not currently taking Coumadin.  Pre-Exam Physical: Performed Jul 10, 2005  Entire physical exam was normal.  Comments: Pt. history reviewed/updated, physical exam performed prior to initiation of sedation? Exam Exam Info: Maximum depth of insertion Stomach, intended Duodenum. Vocal cords visualized. Gastric retroflexion performed. Images taken. ASA Classification: I. Tolerance: good.  Sedation Meds: Patient assessed and found to be appropriate for moderate (conscious) sedation. Fentanyl 50 mcg. given IV. Versed 5 mg. given IV. Cetacaine Spray 2 sprays given aerosolized.  Monitoring: BP and pulse monitoring done. Oximetry used. Supplemental O2 given  Findings Normal: Proximal Esophagus.  - HIATAL HERNIA: Diaphragm 35 cm from mouth. Z-line/GE Junction 31 cm from mouth. Prolapsing, 4 cms. in length. gastric mucosa prolapse. Biopsy/Hiatal Hernia taken.  ICD9: Hernia, Hiatal: 553.3. DIAGNOSTIC TEST: from Antrum. RUT done, results pending  Normal: Duodenal Apex.   Assessment Abnormal examination, see findings above.  Diagnoses: 553.3: Hernia,  Hiatal.   Comments: 4 cm noreducible hiatal hernia, s/p biopsies Events  Unplanned Intervention: No unplanned interventions were required.  Unplanned Events: There were no complications. Plans Medication(s): Await pathology. PPI: Esomeprazole/Nexium 40 mg BID, starting Jul 10, 2005  H2Blocker: Ranitidine/Zantac 300 mg HS, starting Jul 10, 2005  Promotility: Metaclopramide 10 mg HS, starting Jul 10, 2005   Patient Education: Patient given standard instructions for: Hiatal Hernia.  Comments: Ba swallow and esophageal manometry in preparation for possible Nissen fundoplication Disposition: After procedure patient sent to recovery. After recovery patient sent home.   This report was created from the original endoscopy report, which was reviewed and signed by the above listed endoscopist.       FINAL DIAGNOSIS    ***MICROSCOPIC EXAMINATION AND DIAGNOSIS***    ESOPHAGUS, BIOPSIES: GASTROESOPHAGEAL JUNCTION MUCOSA WITH MILD   INFLAMMATION CONSISTENT WITH GASTROESOPHAGEAL REFLUX. NO   INTESTINAL METAPLASIA, DYSPLASIA OR MALIGNANCY IDENTIFIED.    COMMENT   An Alcian Blue stain is performed to determine the presence of   intestinal metaplasia (goblet cell metaplasia). No intestinal   metaplasia (goblet cell metaplasia) is identified with the Alcian   Blue stain. The control stained appropriately.   (MS:mw 07/11/05)    mw   Date Reported: 07/11/2005 Renato Battles, M.D.   *** Electronically Signed Out By MS ***    Clinical information   Reflux pain R/O Barrett' s (tw)    specimen(s) obtained   Esophagogastric junction, biopsy    Gross Description   Received in formalin are tan, soft tissue fragments that are   submitted in toto. Number: multiple   Size: 0.3 to 0.5 cm, one block submitted. (lc:tw 07/10/2005)    tw/

## 2010-08-07 NOTE — Assessment & Plan Note (Signed)
Summary: WITHDRAWAL ZOLOFT ?/WILL COME FASTING FOR POSSIBLE LABS/PS   Vital Signs:  Patient profile:   45 year old female Weight:      192 pounds Temp:     98.0 degrees F oral BP sitting:   114 / 80  (left arm) Cuff size:   large  Vitals Entered By: Alfred Levins, CMA (December 26, 2008 9:51 AM) CC: head pressure, nausea   History of Present Illness: Here for a lingering HA which started 4 days ago. That day she played tennis in the hot sun, and had to stop due to a HA and some nausea. She has had a dull HA and nausea ever since. No vomiting. No fever or sinus symptoms. No vision changes. She has stayed out of the heat and has been drinking water for several days. She has frequent HAs like this, and she thinks she has migraines (although she has never beeen diagnosed with them). Her sister has migraines. She wonders if this could be due to tapering off Zoloft. She has been off this for 3 weeks.   Allergies: 1)  ! Penicillin 2)  ! Sulfa  Past History:  Past Medical History: Reviewed history from 03/08/2008 and no changes required. Allergic rhinitis Asthma, sees Dr. Cyril Mourning Depression, sees [facility] and Dr. Nolen Mu GERD UTI's sees Debbora Dus NP or Dr. Cherly Hensen for gyn exams normal sleep study 12-18-06  Review of Systems  The patient denies anorexia, fever, weight loss, weight gain, vision loss, decreased hearing, hoarseness, chest pain, syncope, dyspnea on exertion, peripheral edema, prolonged cough, hemoptysis, abdominal pain, melena, hematochezia, severe indigestion/heartburn, hematuria, incontinence, genital sores, muscle weakness, suspicious skin lesions, transient blindness, difficulty walking, depression, unusual weight change, abnormal bleeding, enlarged lymph nodes, angioedema, breast masses, and testicular masses.    Physical Exam  General:  Well-developed,well-nourished,in no acute distress; alert,appropriate and cooperative throughout examination Head:   Normocephalic and atraumatic without obvious abnormalities. No apparent alopecia or balding. Eyes:  No corneal or conjunctival inflammation noted. EOMI. Perrla. Funduscopic exam benign, without hemorrhages, exudates or papilledema. Vision grossly normal. Ears:  External ear exam shows no significant lesions or deformities.  Otoscopic examination reveals clear canals, tympanic membranes are intact bilaterally without bulging, retraction, inflammation or discharge. Hearing is grossly normal bilaterally. Nose:  External nasal examination shows no deformity or inflammation. Nasal mucosa are pink and moist without lesions or exudates. Mouth:  Oral mucosa and oropharynx without lesions or exudates.  Teeth in good repair. Neck:  No deformities, masses, or tenderness noted. Lungs:  Normal respiratory effort, chest expands symmetrically. Lungs are clear to auscultation, no crackles or wheezes. Heart:  Normal rate and regular rhythm. S1 and S2 normal without gallop, murmur, click, rub or other extra sounds. Neurologic:  No cranial nerve deficits noted. Station and gait are normal. Plantar reflexes are down-going bilaterally. DTRs are symmetrical throughout. Sensory, motor and coordinative functions appear intact.   Impression & Recommendations:  Problem # 1:  HEADACHE (ICD-784.0)  Her updated medication list for this problem includes:    Imitrex 100 Mg Tabs (Sumatriptan succinate) .Marland Kitchen... As needed for ha  Orders: UA Dipstick w/o Micro (automated)  (81003) Venipuncture ([address]109) TLB-Lipid Panel (80061-LIPID) TLB-BMP (Basic Metabolic Panel-BMET) (80048-METABOL) TLB-CBC Platelet - w/Differential (85025-CBCD) TLB-Hepatic/Liver Function Pnl (80076-HEPATIC) TLB-TSH (Thyroid Stimulating Hormone) (84443-TSH)  Complete Medication List: 1)  Advair Diskus 250-50 Mcg/dose Misc (Fluticasone-salmeterol) .Marland Kitchen.. 1 puff two times a day 2)  Omeprazole 20 Mg Cpdr (Omeprazole) .Marland Kitchen.. 1 by mouth once daily 3)  Wellbutrin  Xl  300 Mg Tb24 (Bupropion hcl) .... Once daily 4)  Zyrtec-d Allergy & Congestion 5-120 Mg Tb12 (Cetirizine-pseudoephedrine) .... Take 1 tablet by mouth twice a day 5)  Tums Ultra 1000 1000 Mg Chew (Calcium carbonate antacid) .... As needed with meals 6)  Proair Hfa 108 (90 Base) Mcg/act Aers (Albuterol sulfate) .... 2 puffs q4 as needed wheezing 7)  Imitrex 100 Mg Tabs (Sumatriptan succinate) .... As needed for ha  Patient Instructions: 1)  This is consistent with a migraine HA cycle, and she probably has been having migraines for years now. Try Imitrex. get labs. Stay out of the heat for awhile.  2)  Please schedule a follow-up appointment as needed .  Prescriptions: IMITREX 100 MG TABS (SUMATRIPTAN SUCCINATE) as needed for HA  #12 x 5   Entered and Authorized by:   Nelwyn Salisbury MD   Signed by:   Nelwyn Salisbury MD on 12/26/2008   Method used:   Electronically to        [facility] ([facility])       [address]       [place], [place]  [address]       Ph: [phone]       Fax: [phone]   RxID:   [id number]

## 2010-08-07 NOTE — Assessment & Plan Note (Signed)
Summary: rov 4 wks////kp   Visit Type:  Follow-up PCP:  Blossom Hoops  Chief Complaint:  slight tightness in the chest - was on trial of nexium rx'd by TP which worked well.  History of Present Illness: 45 year old female with known history of Asthma since childhood, worse during & after pregnancy, previously well controlled on Symbicort. Hx of severe reflux for many yrs, improved  after Nissen's 2 yr ago  02/08/08-- Pt woke up choking in middle of night, since then throat feels raw, has had dry cough, and felt wheezy >> steroid exacerbation.  9/1>> much improved, but c/o chest tightness, spirometry nml, FEv1 99% Breakthrough reflux 4-5/wk, takes tums every night with ranitidine; reglan did not work (dysmotility noted on studies by Dr. Daphine Deutscher post Nissen's). -describes wt gain 60 lbs over last 5 yrs with zoloft & wellbutrin       Updated Prior Medication List: ZYRTEC-D ALLERGY & CONGESTION 5-120 MG TB12 (CETIRIZINE-PSEUDOEPHEDRINE) Take 1 tablet by mouth twice a day PROAIR HFA 108 (90 BASE) MCG/ACT  AERS (ALBUTEROL SULFATE) 2 puffs q4 as needed wheezing [BMN] ZOLOFT 100 MG TABS (SERTRALINE HCL) 1 by mouth daily WELLBUTRIN XL 300 MG  TB24 (BUPROPION HCL) once daily TUMS ULTRA 1000 1000 MG  CHEW (CALCIUM CARBONATE ANTACID) 2 at bedtime PANTOPRAZOLE SODIUM 40 MG TBEC (PANTOPRAZOLE SODIUM) once daily ADVAIR DISKUS 250-50 MCG/DOSE MISC (FLUTICASONE-SALMETEROL) two times a day  Current Allergies: ! PENICILLIN ! SULFA  Past Medical History:    Reviewed history from 12/15/2007 and no changes required:       Allergic rhinitis       Asthma, sees Dr. Cyril Mourning       Depression, sees [facility] and Dr. Nolen Mu       GERD       UTI's       sees Debbora Dus NP or Dr. Cherly Hensen for gyn exams       normal sleep study 12-18-06  Past Surgical History:    Reviewed history from 12/15/2007 and no changes required:       Nissen fundiplication 1-07 per Dr. Renea Ee hernia repairs (2)  in 1-09 per Dr. Daphine Deutscher       Caesarean sections in 2001 and 2003       Cholecystectomy       Tonsillectomy            Review of Systems  The patient denies anorexia, fever, weight loss, weight gain, vision loss, decreased hearing, hoarseness, chest pain, syncope, dyspnea on exertion, peripheral edema, prolonged cough, headaches, hemoptysis, abdominal pain, melena, hematochezia, severe indigestion/heartburn, hematuria, incontinence, genital sores, muscle weakness, suspicious skin lesions, transient blindness, difficulty walking, depression, unusual weight change, abnormal bleeding, enlarged lymph nodes, angioedema, breast masses, and testicular masses.     Vital Signs:  Patient Profile:   45 Years Old Female Height:     62 inches Weight:      195.25 pounds O2 Sat:      97 % O2 treatment:    Room Air Temp:     97.8 degrees F oral Pulse rate:   92 / minute BP sitting:   112 / 64  (left arm) Cuff size:   regular  Vitals Entered By: Marinus Maw (March 08, 2008 9:35 AM)             Comments Medications reviewed with patient Marinus Maw  March 08, 2008 9:39 AM      Physical  Exam  General:     HEENT - no thrush, no post nasal drip No JVD, no lymphadenopathy CVS- s1s2 nml, no murmur RS- clear, no crackles or rhonchi Abd- soft, non-tender, no organomegaly CNS- non focal Ext - no edema  obese.   Mouth:     Melampatti Class III.      Pre-Spirometry FVC    Pred: 3.27 L/min     % Pred: 0 % FEV1    Pred: 2.78 L     % Pred: 0 % FEV1/FVC  Pred: 0.85 %    FEF 25-75  Pred: 3.26 L/min       Problem # 1:  ASTHMA (ICD-493.90) Nml spirometry today, No step up of therapy required. Residual chest tightness may be due to GERD. She prefers advair to symbicort & I think this switch would be OK. The following medications were removed from the medication list:    Symbicort 160-4.5 Mcg/act Aero (Budesonide-formoterol fumarate) .Marland Kitchen... 2  puffs  two times a day     Prednisone 10 Mg Tabs (Prednisone) .Marland KitchenMarland KitchenMarland KitchenMarland Kitchen 4 tabs for 2 days, then 3 tabs for 2 days, 2 tabs for 2 days, then 1 tab for 2 days, then stop  Her updated medication list for this problem includes:    Proair Hfa 108 (90 Base) Mcg/act Aers (Albuterol sulfate) .Marland Kitchen... 2 puffs q4 as needed wheezing    Advair Diskus 250-50 Mcg/dose Misc (Fluticasone-salmeterol) .Marland Kitchen..Marland Kitchen Two times a day  Orders: Est. Patient Level III (13086) Spirometry w/Graph (94010)   Problem # 2:  GERD (ICD-530.81) Discussed non -pharmac measures for GERD incl. HOB elevation, wt loss, control of obstructive sleep apnea . The following medications were removed from the medication list:    Zantac 75 75 Mg Tabs (Ranitidine hcl) .Marland Kitchen... As needed  Her updated medication list for this problem includes:    Tums Ultra 1000 1000 Mg Chew (Calcium carbonate antacid) .Marland Kitchen... 2 at bedtime    Pantoprazole Sodium 40 Mg Tbec (Pantoprazole sodium) ..... Once daily     Problem # 3:  DEPRESSION (ICD-311) She will discuss decreasing doses of wellbutrin & zoloft with her psychiatrist. Wt loss was encouraged- she has met with a nutritionist already. I will review her sleep studies >> addendum 9/4>>PSG 6/08 wt 184 lbs - mild OSA with  AHI 7/h, supine AHI 16.6/h, lowest desatn 88%, prolonged REM latency  Medications Added to Medication List This Visit: 1)  Pantoprazole Sodium 40 Mg Tbec (Pantoprazole sodium) .... Once daily 2)  Advair Diskus 250-50 Mcg/dose Misc (Fluticasone-salmeterol) .... Two times a day   Patient Instructions: 1)  Small frequent meals 2)  Avoid caffeinated beverages 3)  Elevate HOB 30 degrees  4)  Last meal 3 hrs before bedtime 5)  Please schedule a follow-up appointment in 3 months with TP 6)  Please schedule a follow-up appointment in 6 months with Me   Prescriptions: ADVAIR DISKUS 250-50 MCG/DOSE MISC (FLUTICASONE-SALMETEROL) two times a day  #1 x 3   Entered and Authorized by:   Comer Locket Vassie Loll MD   Signed by:   Reynaldo Minium CMA on 03/11/2008   Method used:   Electronically to        [facility]       [address]       [place], [place]  [address]       Ph: [phone]       Fax: [phone]   RxID:  [id number] PANTOPRAZOLE SODIUM 40 MG TBEC (PANTOPRAZOLE SODIUM) once daily  #30 x 2   Entered by:   Reynaldo Minium CMA   Authorized by:   Comer Locket. Vassie Loll MD   Signed by:   Reynaldo Minium CMA on 03/11/2008   Method used:   Electronically to        [facility]       [address]       [place], [place]  [address]       Ph: [phone]       Fax: [phone]   RxID:   [id number]  ]  SPIROMETRY RESULTS  Date:03/08/2008 height inches: 62 Gender: Female  Parameter  Measured Predicted %Predicted  FVC              3.27      0  FEV1              2.78      0  FEV1/FVC              0.85      0  FEF25-75              3.26      0  PEF              413      0  Post-Bronchodilator  Parameter Measured Change from Baseline  FVC               FEV1               FEV1/FVC                FEF25-75                PEF               Pulse Oximetry Pulse: 92 O2 Saturation %: 97    Appended Document: rov 4 wks////kp called patient and let her know test showed asthma in good control - chest tightness may be due to reflux  Appended Document: rov 4 wks////kp left message on pt's answering machine after being unable to reach or have pt return call that asthma in good control - chest tightness probably due to reflux  Appended Document: rov 4 wks////kp Medications Added OMEPRAZOLE 20 MG TBEC (OMEPRAZOLE) two times a day       protonix not approved by insurance, trial of omeprazole first   Clinical Lists Changes  Medications: Removed medication of PANTOPRAZOLE SODIUM 40 MG TBEC (PANTOPRAZOLE SODIUM) once daily Added new medication of OMEPRAZOLE 20 MG TBEC (OMEPRAZOLE) two times a day - Signed Rx  of OMEPRAZOLE 20 MG TBEC (OMEPRAZOLE) two times a day;  #60 x 1;  Signed;  Entered by: Comer Locket Vassie Loll MD;  Authorized by: Comer Locket Vassie Loll MD;  Method used: Electronically to [facility] [place], [place], [place]  [address], Ph: [phone], Fax: [phone]    Prescriptions: OMEPRAZOLE 20 MG TBEC (OMEPRAZOLE) two times a day  #60 x 1   Entered and Authorized by:   Comer Locket. Vassie Loll MD   Signed by:   Comer Locket Vassie Loll MD on 03/21/2008   Method used:   Electronically to        [facility]       [address]       [place]  [place], [place]  [address]       Ph: [phone]       Fax: [phone]   RxID:   [id number]   Appended Document: rov 4 wks////kp Notified pt that omeprazole had been called into pharmacy

## 2010-08-07 NOTE — Assessment & Plan Note (Signed)
Summary: continued pain in arm/dm   Vital Signs:  Patient profile:   45 year old female Weight:      191 pounds Temp:     97.7 degrees F oral Pulse rate:   138 / minute BP sitting:   104 / 72  (left arm) Cuff size:   large  Vitals Entered By: Alfred Levins, CMA (October 10, 2008 4:38 PM) CC: rt arm pain   History of Present Illness: She fell down steps on 10-02-08 as noted in the chart and sustained significant bruising all over her body, especially along the right forearm. Xrays were negative for fracture. In general she is getting better but her right forearm is still tender and swollen. The bruising is fading away. She is working. Had been wrapping the arm with an ACE until today. Using Motrin. She wants to know if this is to be expected or should she be better by now.   Allergies: 1)  ! Penicillin 2)  ! Sulfa  Past History:  Past Medical History:    Reviewed history from 03/08/2008 and no changes required:    Allergic rhinitis    Asthma, sees Dr. Cyril Mourning    Depression, sees [facility] and Dr. Nolen Mu    GERD    UTI's    sees Debbora Dus NP or Dr. Cherly Hensen for gyn exams    normal sleep study 12-18-06  Review of Systems  The patient denies anorexia, fever, weight loss, weight gain, vision loss, decreased hearing, hoarseness, chest pain, syncope, dyspnea on exertion, peripheral edema, prolonged cough, headaches, hemoptysis, abdominal pain, melena, hematochezia, severe indigestion/heartburn, hematuria, incontinence, genital sores, muscle weakness, suspicious skin lesions, transient blindness, difficulty walking, depression, unusual weight change, abnormal bleeding, enlarged lymph nodes, angioedema, breast masses, and testicular masses.    Physical Exam  General:  Well-developed,well-nourished,in no acute distress; alert,appropriate and cooperative throughout examination Extremities:  right forearm has extensive ecchymoses but these are definitely fading as expected. She  has a tender firm lump along the extensor surface of the forearm consistent with a hematoma. Thje elbow and wrist show full ROM. Neuro and vascular in the right hand are intact.    Impression & Recommendations:  Problem # 1:  CONTUSION OF FOREARM (ICD-923.10)  Complete Medication List: 1)  Advair Diskus 250-50 Mcg/dose Misc (Fluticasone-salmeterol) .Marland Kitchen.. 1 puff two times a day 2)  Omeprazole 20 Mg Cpdr (Omeprazole) .Marland Kitchen.. 1 by mouth once daily 3)  Zoloft 100 Mg Tabs (Sertraline hcl) .... 2 by mouth daily 4)  Wellbutrin Xl 300 Mg Tb24 (Bupropion hcl) .... Once daily 5)  Zyrtec-d Allergy & Congestion 5-120 Mg Tb12 (Cetirizine-pseudoephedrine) .... Take 1 tablet by mouth twice a day 6)  Tums Ultra 1000 1000 Mg Chew (Calcium carbonate antacid) .... As needed with meals 7)  Proair Hfa 108 (90 Base) Mcg/act Aers (Albuterol sulfate) .... 2 puffs q4 as needed wheezing  Patient Instructions: 1)  She has a hematoma which is healing appropriately, and I reassured her about this. It will take a month for this to go back down. No more ACE wraps are needed. Use Motrin as needed .  2)  Please schedule a follow-up appointment as needed .

## 2010-08-07 NOTE — Assessment & Plan Note (Signed)
Summary: tightness in chest/apc  Medications Added REGLAN 5 MG  TABS (METOCLOPRAMIDE HCL) Take one tablet daily as needed MUCINEX DM   TB12 (DEXTROMETHORPHAN-GUAIFENESIN TB12) Use as directed as needed ZYRTEC-D ALLERGY & CONGESTION 5-120 MG TB12 (CETIRIZINE-PSEUDOEPHEDRINE) Take 1 tablet by mouth twice a day WELLBUTRIN SR 150 MG  TB12 (BUPROPION HCL) Take 1 tablet by mouth once a day VENTOLIN HFA 108 (90 BASE) MCG/ACT  AERS (ALBUTEROL SULFATE) as needed ZITHROMAX Z-PAK 250 MG  TABS (AZITHROMYCIN) take as directed PROAIR HFA 108 (90 BASE) MCG/ACT  AERS (ALBUTEROL SULFATE) 2 puffs q4 as needed wheezing [BMN]      Allergies Added: ! PENICILLIN ! SULFA  Chief Complaint:  tightness in chest and increased SOB x 1 week and productive cough with yellow/brown sputum.  History of Present Illness: Complains 1 week history cough/congestion with thick yellow and brown sputum. Denies chest pain, dyspnea, orthopnea, hemoptysis, fever, n/v/d, edema, recent travel or antibiotics.     Current Allergies (reviewed today): ! PENICILLIN ! SULFA Updated/Current Medications (including changes made in today's visit):  SERTRALINE HCL 100 MG  TABS (SERTRALINE HCL) Take two tablets daily REGLAN 5 MG  TABS (METOCLOPRAMIDE HCL) Take one tablet daily as needed SYMBICORT 80-4.5 MCG/ACT  AERO (BUDESONIDE-FORMOTEROL FUMARATE) Inhale two puffs twice daily MUCINEX DM   TB12 (DEXTROMETHORPHAN-GUAIFENESIN TB12) Use as directed as needed ZYRTEC-D ALLERGY & CONGESTION 5-120 MG TB12 (CETIRIZINE-PSEUDOEPHEDRINE) Take 1 tablet by mouth twice a day WELLBUTRIN SR 150 MG  TB12 (BUPROPION HCL) Take 1 tablet by mouth once a day VENTOLIN HFA 108 (90 BASE) MCG/ACT  AERS (ALBUTEROL SULFATE) as needed ZITHROMAX Z-PAK 250 MG  TABS (AZITHROMYCIN) take as directed PROAIR HFA 108 (90 BASE) MCG/ACT  AERS (ALBUTEROL SULFATE) 2 puffs q4 as needed wheezing [BMN]   Past Medical History:    Current Problems:     PHARYNGITIS, ACUTE  (ICD-462)    DEPRESSION (ICD-311)    ASTHMA (ICD-493.90)    ALLERGIC RHINITIS (ICD-477.9)        Risk Factors:  Tobacco use:  never    Vital Signs:  Patient Profile:   45 Years Old Female Weight:      192.25 pounds O2 Sat:      96 % Temp:     97.4 degrees F oral Pulse rate:   125 / minute BP sitting:   124 / 98  (right arm) Cuff size:   regular  Vitals Entered By: Boone Master CNA (July 08, 2007 11:21 AM) Oxygen therapy Room Air             Is Patient Diabetic? No Comments Medications reviewed with patient ..................................................................Marland KitchenBoone Master CNA  July 08, 2007 11:21 AM      Physical Exam  No acute distress with stable vital signs. HEENT:  Nasal mucosa pale, clear drainage.  Oropharynx is clear. LUNG FIELDS:  lung sounds clear HEART:  There is a regular rhythm without murmur, gallop, rub. ABDOMEN:  Soft, non-tender EXTREMITIES:  Warm without calf tenderness, cyanosis, clubbing, edema.        Impression & Recommendations:  Problem # 1:  ASTHMATIC BRONCHITIS, ACUTE (ICD-466.0) Recommend Z-Pack and mucinex dm two times a day as needed for cough/conestion. follow up Dr. Vassie Loll. 1 month and as needed Please contact office for sooner follow up if symptoms do not improve or worsen  Her updated medication list for this problem includes:    Symbicort 80-4.5 Mcg/act Aero (Budesonide-formoterol fumarate) ..... Inhale two puffs twice daily    Mucinex Dm Tb12 (  Dextromethorphan-guaifenesin tb12) ..... Use as directed as needed    Zyrtec-d Allergy & Congestion 5-120 Mg Tb12 (Cetirizine-pseudoephedrine) .Marland Kitchen... Take 1 tablet by mouth twice a day    Ventolin Hfa 108 (90 Base) Mcg/act Aers (Albuterol sulfate) .Marland Kitchen... As needed    Zithromax Z-pak 250 Mg Tabs (Azithromycin) .Marland Kitchen... Take as directed    Proair Hfa 108 (90 Base) Mcg/act Aers (Albuterol sulfate) .Marland Kitchen... 2 puffs q4 as needed wheezing   Medications Added to Medication  List This Visit: 1)  Reglan 5 Mg Tabs (Metoclopramide hcl) .... Take one tablet daily as needed 2)  Mucinex Dm Tb12 (Dextromethorphan-guaifenesin tb12) .... Use as directed as needed 3)  Zyrtec-d Allergy & Congestion 5-120 Mg Tb12 (Cetirizine-pseudoephedrine) .... Take 1 tablet by mouth twice a day 4)  Wellbutrin Sr 150 Mg Tb12 (Bupropion hcl) .... Take 1 tablet by mouth once a day 5)  Ventolin Hfa 108 (90 Base) Mcg/act Aers (Albuterol sulfate) .... As needed 6)  Zithromax Z-pak 250 Mg Tabs (Azithromycin) .... Take as directed 7)  Proair Hfa 108 (90 Base) Mcg/act Aers (Albuterol sulfate) .... 2 puffs q4 as needed wheezing   Patient Instructions: 1)  Please schedule a follow-up appointment in 1 month Dr. Vassie Loll  2)  Please contact office for sooner follow up if symptoms do not improve or worsen  3)  Finish antibiotics.  4)  Mucinex dm two times a day for cough.     Prescriptions: PROAIR HFA 108 (90 BASE) MCG/ACT  AERS (ALBUTEROL SULFATE) 2 puffs q4 as needed wheezing Brand medically necessary #1 x 2   Entered and Authorized by:   Rubye Oaks NP   Signed by:   Rubye Oaks NP on 07/08/2007   Method used:   Electronically sent to ...       [facility]       [address]       [place], [place]  [address]       Ph: [phone]       Fax: [phone]   RxID:   [id number] ZITHROMAX Z-PAK 250 MG  TABS (AZITHROMYCIN) take as directed  #1 x 0   Entered and Authorized by:   Rubye Oaks NP   Signed by:   Rubye Oaks NP on 07/08/2007   Method used:   Electronically sent to ...       [facility]       [address]       [place], [place]  [address]       Ph: [phone]       Fax: [phone]   RxID:   [id number]  ]   Appended Document: tightness in chest/apc      Current Allergies: ! PENICILLIN ! SULFA          Impression & Recommendations:  Problem # 1:  ASTHMATIC BRONCHITIS,  ACUTE (ICD-466.0)  Her updated medication list for this problem includes:    Symbicort 80-4.5 Mcg/act Aero (Budesonide-formoterol fumarate) ..... Inhale two puffs twice daily    Mucinex Dm Tb12 (Dextromethorphan-guaifenesin tb12) ..... Use as directed as needed    Zyrtec-d Allergy & Congestion 5-120 Mg Tb12 (Cetirizine-pseudoephedrine) .Marland Kitchen... Take 1 tablet by mouth twice a day    Ventolin Hfa 108 (90 Base) Mcg/act Aers (Albuterol sulfate) .Marland Kitchen... As needed    Zithromax Z-pak 250 Mg Tabs (Azithromycin) .Marland Kitchen... Take as directed    Proair Hfa 108 (  90 Base) Mcg/act Aers (Albuterol sulfate) .Marland Kitchen... 2 puffs q4 as needed wheezing  Orders: Est. Patient Level III ([address])      ]

## 2010-08-07 NOTE — Medication Information (Signed)
Summary: Tax adviser   Imported By: Valinda Hoar 01/25/2010 08:25:11  _____________________________________________________________________  External Attachment:    Type:   Image     Comment:   External Document

## 2010-08-07 NOTE — Assessment & Plan Note (Signed)
Summary: Acute NP office visit - tightness in chest   Primary Provider/Referring Provider:  Nelwyn Salisbury MD  CC:  occasional tightness in left side of chest x2weeks - denies changes in breathing.  History of Present Illness: 45/F, with known history of Asthma since childhood, worse during & after pregnancy 2001.  Triggers - GERD vs allergies Hx of severe reflux for many yrs, improved  after Nissen's 2 yr ago 02/08/08-- Pt woke up choking in middle of night, since then throat feels raw, has had dry cough, and felt wheezy >> steroids  9/1>> much improved, but c/o chest tightness, spirometry nml, FEv1 99% Breakthrough reflux 4-5/wk, takes tums every night with ranitidine; reglan did not work (dysmotility noted on studies by Dr. Daphine Deutscher post Nissen's in 2008). -describes wt gain 60 lbs over last 5 yrs with zoloft & wellbutrin   2 exacerbations in 2010 requiring steroids.  Nov 06, 2009 9:23 AM  [facility] has stopped zoloft/wellbutrin and has lost 30lbs. GERD under good control - off omeprazole, on tums twice/ month. . No hemoptysis, never smoker., no exertional chest pain, diaphoresis, not related to food, no overt reflux/bloating or gas. no rash.    January 22, 2010--Presents for work in visit. Complains of occasional tightness in left side of chest  for 4 weeks. Notices on/off, mainly when she is stressed, overwhelmed. no associated dyspnea, radiating pain, diaphoresis. Last for few seconds resolves. No overt reflux, no cough, dyspnea. Now is off wellbutrin/zoloft does not want to return back to these. Has lost 50lbs since stopping these. Wants to make sure this is not her heart. She has no exertional pain. FH consists of MI in P. GF in 60s. Denies  , dyspnea, orthopnea, hemoptysis, fever, n/v/d, edema, headache, papiitations, syncope.   Medications Prior to Update: 1)  Advair Diskus 250-50 Mcg/dose Misc (Fluticasone-Salmeterol) .Marland Kitchen.. 1 Puff Two Times A Day 2)  Zyrtec-D Allergy & Congestion 5-120 Mg Tb12  (Cetirizine-Pseudoephedrine) .... Take 1 Tablet By Mouth Once A Day 3)  Tums Ultra 1000 1000 Mg  Chew (Calcium Carbonate Antacid) .... As Needed With Meals 4)  Proair Hfa 108 (90 Base) Mcg/act  Aers (Albuterol Sulfate) .... 2 Puffs Q4 As Needed Wheezing  Current Medications (verified): 1)  Advair Diskus 250-50 Mcg/dose Misc (Fluticasone-Salmeterol) .Marland Kitchen.. 1 Puff Two Times A Day 2)  Zyrtec-D Allergy & Congestion 5-120 Mg Tb12 (Cetirizine-Pseudoephedrine) .... Take 1 Tablet By Mouth Once A Day 3)  Tums Ultra 1000 1000 Mg  Chew (Calcium Carbonate Antacid) .... As Needed With Meals 4)  Proair Hfa 108 (90 Base) Mcg/act  Aers (Albuterol Sulfate) .... 2 Puffs Q4 As Needed Wheezing  Allergies (verified): 1)  ! Penicillin 2)  ! Sulfa  Past History:  Past Medical History: Last updated: 11/22/2009 Allergic rhinitis Asthma, sees Dr. Cyril Mourning Depression, sees [facility] and Dr. Nolen Mu GERD UTI's sees Debbora Dus NP or Dr. Cherly Hensen for gyn exams normal sleep study 12-18-06  Past Surgical History: Last updated: 12/15/2007 Nissen fundiplication 1-07 per Dr. Renea Ee hernia repairs (2) in 1-09 per Dr. 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  Risk Factors: Smoking Status: never (09/13/2008)  Family History: Family History of Alcoholism/Addiction Family History of Arthritis Family History Breast cancer 1st degree relative  <50 Family History of CAD Female 1st degree relative <50 Family History Hypertension   mother living with rheumatoid arthritis father living with no medical history sister living with thyroid number sister with Crohn's disease  PGF passed from MI at 56  Review of Systems      See HPI  Vital Signs:  Patient profile:   45 year old female Height:      62 inches Weight:      157 pounds BMI:     28.82 O2 Sat:      100 % on Room air Temp:     99.1 degrees F oral Pulse rate:   98 / minute BP sitting:   110 / 62  (right arm) Cuff size:   regular  Vitals Entered By: Boone Master CNA/MA (January 22, 2010 3:40 PM)  O2 Flow:  Room air CC: occasional tightness in left side of chest x2weeks - denies changes in breathing Is Patient Diabetic? No Comments Medications reviewed with patient Daytime contact number verified with patient. Boone Master CNA/MA  January 22, 2010 3:40 PM    Physical Exam  Additional Exam:  GEN: A/Ox3; pleasant , NAD HEENT:  [place]/AT, , EACs-clear, TMs-wnl, NOSE-clear, THROAT-clear NECK:  Supple w/ fair ROM; no JVD; normal carotid impulses w/o bruits; no thyromegaly or nodules palpated; no lymphadenopathy. RESP  Clear to P & A; w/o, wheezes/ rales/ or rhonchi. CARD:  RRR, no m/r/g  , tenderness along left upper/mid chest wall, no redness, rash noted.  GI:   Soft & nt; nml bowel sounds; no organomegaly or masses detected. [place]: Warm bil,  no calf tenderness edema, clubbing, pulses intact Neuro: EOM-wnl, PERRLA, CN 2-12 intact,nml equal grips/streng    Impression & Recommendations:  Problem # 1:  CHEST PAIN (ICD-786.50)  Atypical chest pain  EKG is normal today suspect this may be secondary to musculoskeletal pain and/or stress  REC;  stress reducers, nsaids and heat if not improving will need to see PCP prev xray few months ago nnl.   Orders: Est. Patient Level IV (16109)  Patient Instructions: 1)  Warm heat to chest wall,  2)  Ibuprofen 200mg  3 tabs  two times a day w/ food x 5 days 3)  If not improving will need follow up w/ Primary doctor.  4)  Please contact office for sooner follow up if symptoms do not improve or worsen     CardioPerfect ECG  ID: [id number] Patient: [name] [name], [name] [name] DOB: Sep 04, 1965 Age: 45 Years Old Sex: Female Race: White Physician: Nelwyn Salisbury MD Technician: Carver Fila Height: 62 Weight: 157 Status: Unconfirmed Past Medical History:  Allergic rhinitis Asthma, sees Dr. Cyril Mourning Depression, sees [facility] and Dr. Nolen Mu GERD UTI's sees Debbora Dus NP or Dr. Cherly Hensen for gyn exams normal sleep study 12-18-06  Recorded: 01/22/2010 4:17 PM P/PR: 103 ms / 148 ms - Heart rate (maximum exercise) QRS: 85 QT/QTc/QTd: 349 ms / 388 ms / 22 ms - Heart rate (maximum exercise)  P/QRS/T axis: 26 deg / 43 deg / 38 deg - Heart rate (maximum exercise)  Heartrate: 82 bpm  Interpretation:   sinus rhythm  low QRS voltage in precordial leads   Normal variant of ECG

## 2010-08-07 NOTE — Progress Notes (Signed)
  Medications Added MUCINEX DM   TB12 (DEXTROMETHORPHAN-GUAIFENESIN TB12) Use as directed.       Phone Note Other Incoming   Call placed by: Ardyth Man,  December 31, 2006 10:25 AM Call from: Patient Summary of Call: Pt. called office and wanted results.  Per Dr. Vanetta Shawl okay.  Pt. states:" seems to be doing better, taking mucinex and still has a dry cough, but seems to be getting better, Pt. aware of UC or ED if worsening symptoms and also to call our office to be worked in if needed.  Pt. aware that Dr. Blossom Hoops is out of the office until Monday January 05, 2007. Initial call taken by: Ardyth Man,  December 31, 2006 10:28 AM  New/Updated Medications: MUCINEX DM   TB12 (DEXTROMETHORPHAN-GUAIFENESIN TB12) Use as directed.  New/Updated Medications: MUCINEX DM   TB12 (DEXTROMETHORPHAN-GUAIFENESIN TB12) Use as directed.  Appended Document:  Patient should also call Asthma specialist if still coughing.

## 2010-08-07 NOTE — Op Note (Signed)
Summary: Nissen Fundoplication   NAME:  [name] [name], [name] [name]              ACCOUNT NO.:  [id number]   MEDICAL RECORD NO.:  [id number]          PATIENT TYPE:  INP   LOCATION:  1419                         FACILITY[facility]  [facility]   PHYSICIAN:  Thornton Park. Daphine Deutscher, MD  DATE OF BIRTH:  24-Aug-1965   DATE OF PROCEDURE:  08/28/2005  DATE OF DISCHARGE:                                 OPERATIVE REPORT   PREOPERATIVE DIAGNOSIS:  This is a 45 year old woman with a 6-year history  of worsening gastroesophageal reflux disease. She also has adult-onset  asthma and on upper GI had a moderately large sliding hiatal hernia with  significant GE reflux.   POSTOPERATIVE DIAGNOSIS:  Same.   PROCEDURE:  Laparoscopic Nissen fundoplication over a #50 lighted bougie  with a two suture pledgeted closure of the hiatus posteriorly and a three  suture wrap.   SURGEON:  Thornton Park. Daphine Deutscher, MD   ASSISTANT:  Anselm Pancoast. Zachery Dakins, M.D.   ANESTHESIA:  General endotracheal.   DESCRIPTION OF PROCEDURE:  The patient was taken to room one and the abdomen  was prepped with chlorhexidine after intubation. Access to the abdomen was  gained to the left upper quadrant using an Optiview technique and a zero  degree scope entering without difficulty and insufflating. Two other trocars  were placed to the right of the midline, one below the umbilicus and a 5 mm  in the upper midline for the neck Nathanson liver retractor. Survey of the  abdomen revealed some adhesions down in her pelvis and it looked like there  was a small midline possible fascial defect where she had had a C-section.  The Satira Mccallum was inserted and the hiatal hernia was visualized and it was  prominent in that the stomach was up in the chest. I went ahead and grasped  the stomach and reduced it and used a harmonic to take down the pars  flaccida and expose the right crus and then incise that along the crus and  carried anteriorly across the pharyngoesophageal ligament  dividing this and  then went over to the left crus.   I next took down short gastric's on the left side with a harmonic and then  completely delineated the left crus and the esophagogastric junction. I then  worked from the patient's right side to place two pledgeted sutures and to  close the hiatus. I then passed a 50 lighted bougie into the stomach. I then  reached around and grasped the stomach and brought contiguous portion of  stomach which I wrapped and sutured to with three sutures __________ suture.  These were tied intracorporeally and each got a bite of esophagus. Prior to  doing that, I did defat the esophagogastric  junction as it was a large fat pad and would be obscuring that. A nice wrap  was present. No bleeding was noted. Port sites were then injected with  Marcaine. I surveyed again and withdrew the   Dictation stopped here.      Thornton Park Daphine Deutscher, MD  Electronically Signed     MBM/MEDQ  D:  08/28/2005  T:  08/29/2005  Job:  [id number]   cc:   Lina Sar, M.D. [facility]  [address]  [place]  [place] [address]   Leanne Chang, M.D.  Fax: [phone]   Eugenia Pancoast, MD

## 2010-08-07 NOTE — Assessment & Plan Note (Signed)
Summary: sore throat,congested.cbs  Medications Added SERTRALINE HCL 100 MG  TABS (SERTRALINE HCL) Take two tablets daily REGLAN 5 MG  TABS (METOCLOPRAMIDE HCL) Take one tablet daily as directed SYMBICORT 80-4.5 MCG/ACT  AERO (BUDESONIDE-FORMOTEROL FUMARATE) Inhale two puffs twice daily        Vital Signs:  Patient Profile:   45 Years Old Female Weight:      189 pounds Temp:     98.4 degrees F oral Pulse rate:   72 / minute Resp:     16 per minute BP sitting:   110 / 70  (right arm)  Pt. in pain?   no  Vitals Entered By: Ardyth Man (December 26, 2006 11:00 AM)                Chief Complaint:  Sore throat, congestion and cough X 2-3 days., and URI symptoms.  History of Present Illness:  URI Symptoms      This is a 45 year old woman who presents with URI symptoms.  The symptoms began duration > 3 days ago.  The patient reports nasal congestion, sore throat, dry cough, and sick contacts, but denies earache.  The patient denies fever, stiff neck, dyspnea, and wheezing.  The patient denies itchy watery eyes, itchy throat, sneezing, and seasonal symptoms.  Risk factors for Strep sinusitis include Strep exposure.  The patient denies the following risk factors for Strep sinusitis: unilateral facial pain, unilateral nasal discharge, poor response to decongestant, and tooth pain.  3 family members with strep throat.        Physical Exam  General:     Well-developed,well-nourished,in no acute distress; alert,appropriate and cooperative throughout examination Ears:     External ear exam shows no significant lesions or deformities.  Otoscopic examination reveals clear canals, tympanic membranes are intact bilaterally without bulging, retraction, inflammation or discharge. Hearing is grossly normal bilaterally. Nose:     swollen turbinate with clear nasal d/c Mouth:     o/p mildly erythematous Lungs:     Bronchial breathe sounds that clear with coughing. No wheezing  noted:normal respiratory effort, no intercostal retractions, and no crackles.   Heart:     Normal rate and regular rhythm. S1 and S2 normal without gallop, murmur, click, rub or other extra sounds.    Impression & Recommendations:  Problem # 1:  U R I (ICD-465.9) with strep exposure 1. Continue current asthma therapy as per her Pulmonogist 2. Add Z-pak to cover for strep given exposure and symptoms 3 Chest x-ray: negative 4. F/u if no improvement or worsens Instructed on symptomatic treatment. Call if symptoms persist or worsen.   Medications Added to Medication List This Visit: 1)  Sertraline Hcl 100 Mg Tabs (Sertraline hcl) .... Take two tablets daily 2)  Reglan 5 Mg Tabs (Metoclopramide hcl) .... Take one tablet daily as directed 3)  Symbicort 80-4.5 Mcg/act Aero (Budesonide-formoterol fumarate) .... Inhale two puffs twice daily  Other Orders: Rapid Strep ([address])   Patient Instructions: 1)  Take your antibiotic as prescribed until ALL of it is gone, but stop if you develop a rash or swelling and contact our office as soon as possible.    Laboratory Results    Other Tests  Rapid Strep: negative

## 2010-08-07 NOTE — Assessment & Plan Note (Signed)
Summary: to be est/mhf   Vital Signs:  Patient Profile:   45 Years Old Female Height:     62 inches Weight:      186 pounds Temp:     98.5 degrees F oral Pulse rate:   106 / minute BP sitting:   102 / 74  (left arm) Cuff size:   large  Vitals Entered By: Alfred Levins, CMA (December 15, 2007 9:15 AM)                 PCP:  Blossom Hoops  Chief Complaint:  establish.  History of Present Illness: 45 yr old female to establish with [place]. She had previously seen Dr. Blossom Hoops at our [facility] office. She feels fine and has no complaints. Her last labwork was over a year ago from what I can see  in her records.     Current Allergies (reviewed today): ! PENICILLIN ! SULFA  Past Medical History:    Reviewed history from 07/08/2007 and no changes required:       Allergic rhinitis       Asthma, sees Dr. Earlene Plater       Depression, sees [facility] and Dr. Nolen Mu       GERD       UTI's       sees Debbora Dus NP or Dr. Cherly Hensen for gyn exams       normal sleep study 12-18-06  Past Surgical History:    Reviewed history from 07/29/2007 and no changes required:       Nissen fundiplication 1-07 per Dr. Renea Ee hernia repairs (2) in 1-09 per Dr. Daphine Deutscher       Caesarean sections in 2001 and 2003       Cholecystectomy       Tonsillectomy          Family History:    Reviewed history and no changes required:       Family History of Alcoholism/Addiction       Family History of Arthritis       Family History Breast cancer 1st degree relative <50       Family History of CAD Female 1st degree relative <50       Family History Hypertension  Social History:    Reviewed history and no changes required:       Married       Never Smoked       Alcohol use-no   Risk Factors:  Tobacco use:  never Alcohol use:  no   Review of Systems  The patient denies anorexia, fever, weight loss, weight gain, vision loss, decreased hearing, hoarseness, chest pain, syncope,  dyspnea on exertion, peripheral edema, prolonged cough, headaches, hemoptysis, abdominal pain, melena, hematochezia, severe indigestion/heartburn, hematuria, incontinence, genital sores, muscle weakness, suspicious skin lesions, transient blindness, difficulty walking, depression, unusual weight change, abnormal bleeding, enlarged lymph nodes, angioedema, breast masses, and testicular masses.     Physical Exam  General:     overweight-appearing.   Neck:     No deformities, masses, or tenderness noted. Lungs:     Normal respiratory effort, chest expands symmetrically. Lungs are clear to auscultation, no crackles or wheezes. Heart:     Normal rate and regular rhythm. S1 and S2 normal without gallop, murmur, click, rub or other extra sounds.    Impression & Recommendations:  Problem # 1:  GERD (ICD-530.81)  The following medications were removed from the medication list:  Ra Acid Reducer Max St 150 Mg Tabs (Ranitidine hcl) .Marland Kitchen... At bedtime  Her updated medication list for this problem includes:    Tums Ultra 1000 1000 Mg Chew (Calcium carbonate antacid) .Marland Kitchen... 2 at bedtime    Zantac 75 75 Mg Tabs (Ranitidine hcl) .Marland Kitchen... As needed   Problem # 2:  DEPRESSION (ICD-311)  The following medications were removed from the medication list:    Sertraline Hcl 100 Mg Tabs (Sertraline hcl) .Marland Kitchen... Take two tablets daily    Wellbutrin Sr 150 Mg Tb12 (Bupropion hcl) .Marland Kitchen... Take 1 tablet by mouth once a day  Her updated medication list for this problem includes:    Zoloft 100 Mg Tabs (Sertraline hcl) .Marland Kitchen... 1 by mouth daily    Wellbutrin Xl 300 Mg Tb24 (Bupropion hcl) ..... Once daily   Problem # 3:  ASTHMA (ICD-493.90)  The following medications were removed from the medication list:    Symbicort 80-4.5 Mcg/act Aero (Budesonide-formoterol fumarate) ..... Inhale two puffs twice daily  Her updated medication list for this problem includes:    Proair Hfa 108 (90 Base) Mcg/act Aers (Albuterol  sulfate) .Marland Kitchen... 2 puffs q4 as needed wheezing    Symbicort 160-4.5 Mcg/act Aero (Budesonide-formoterol fumarate) .Marland Kitchen... 2  puffs  two times a day   Problem # 4:  ALLERGIC RHINITIS (ICD-477.9)  Complete Medication List: 1)  Zyrtec-d Allergy & Congestion 5-120 Mg Tb12 (Cetirizine-pseudoephedrine) .... Take 1 tablet by mouth twice a day 2)  Proair Hfa 108 (90 Base) Mcg/act Aers (Albuterol sulfate) .... 2 puffs q4 as needed wheezing 3)  Zoloft 100 Mg Tabs (Sertraline hcl) .Marland Kitchen.. 1 by mouth daily 4)  Symbicort 160-4.5 Mcg/act Aero (Budesonide-formoterol fumarate) .... 2  puffs  two times a day 5)  Wellbutrin Xl 300 Mg Tb24 (Bupropion hcl) .... Once daily 6)  Tums Ultra 1000 1000 Mg Chew (Calcium carbonate antacid) .... 2 at bedtime 7)  Zantac 75 75 Mg Tabs (Ranitidine hcl) .... As needed   Patient Instructions: 1)  Will set her up for fasting labs soon. 2)  It is important that you exercise regularly at least 20 minutes 5 times a week. If you develop chest pain, have severe difficulty breathing, or feel very tired , stop exercising immediately and seek medical attention.   ]

## 2010-08-07 NOTE — Op Note (Signed)
Summary: Ventral Hernia  NAME:  [name] [name], [name] [name]              ACCOUNT NO.:  [id number]      MEDICAL RECORD NO.:  [id number]          PATIENT TYPE:  AMB      LOCATION:  DAY                          FACILITY:  [facility]      PHYSICIAN:  Thornton Park. Daphine Deutscher, MD  DATE OF BIRTH:  [birth year]      DATE OF PROCEDURE:  07/16/2007   DATE OF DISCHARGE:                                  OPERATIVE REPORT      PREOPERATIVE DIAGNOSIS:  Upper midline trocar hernia, incarcerated with   fat.      POSTOPERATIVE DIAGNOSIS:  Upper midline trocar hernia with incarcerated   fat and lower midline Pfannenstiel incisional hernia from C section 5   years ago containing incarcerated omentum.      PROCEDURE:  Laparoscopic repair of lower ventral hernia with PROCEED   mesh 12 x 15 cm, primary closure after reduction of the incarcerated   trocar site hernia in the upper midline with four sutures of zero   Prolene.      SURGEON:  Thornton Park. Daphine Deutscher, MD      ASSISTANT:  Anselm Pancoast. Zachery Dakins, M.D.      ANESTHESIA:  General endotracheal.      SPECIMENS:  None.      ESTIMATED BLOOD LOSS:  Minimal.      DESCRIPTION OF PROCEDURE:  [name] [name] is a 45 year old lady on whom   I did a laparoscopic Nissen fundoplication on August 28, 2005.  This   was done over 50-French dilator and she had a two suture pledgeted   closure of the hiatal hernia.  She has had some problems with motility   and she has underlying asthma with coughing and developed some pain in   the upper midline and was found a small trocar hernia.  We followed this   but the pain has been getting worse.  She has been unable to lose weight   which we tried to do and she comes at this time for laparoscopic repair.   After prepping her with Techni-Care, I made a small transverse incision   through her previous left upper quadrant incision and used a 0 degrees   OptiVu trocar to enter the abdomen and insufflated.  In the upper   midline, where the falciform  ligament was, is where the hernia into the   trocar was and using a harmonic scalpel I took this away and reduced the   hernia, realizing she had a trocar sized fascial defect.      More importantly was in the lower midline she had diastasis in a pocket   in her right lower quadrant and hernia site.  I went ahead and placed   another 5 mm on the left side and another on the right and used those.   I went ahead and removed omentum that was incarcerated up in this lower   midline hernia.  I used a piece of PROCEED mesh that was 12 X 15, rolled   and placed through a 10 trocar which we inserted through the  other upper   trocar hernia site.  This gave me a 10 mm in the midline going through   that hernia sac, two 5s on the left and one on the right.  This PROCEED   had Prolene sutures placed along the axis of the ellipse.  These were 0   Prolene.  It was inserted and rolled out.  I used an Endoclose to reach   in and grab the Prolene.  I pulled these out and tied them and the   remainder portion of the hernia was repaired with a tacker, tacking this   nicely to the peritoneum.      Everything else appeared to be in order.  I injected the sites with   Marcaine.  The upper midline hernia was repaired under direct vision   after excising the sac with four sutures of zero Prolene.  I examined   this from the inside with a laparoscope after doing this and it was a   good repair.  Wounds were then closed with 4-0 Vicryl and with   Dermabond.  The patient was given Tylox for pain and will be followed up   the office in 3 weeks.               Thornton Park Daphine Deutscher, MD   Electronically Signed            MBM/MEDQ  D:  07/16/2007  T:  07/16/2007  Job:  [id number]      cc:   Leanne Chang, M.D.   Fax: [phone]      Everardo Beals. Juanda Chance, MD, [facility]   [address]   [place], [place] [address]

## 2010-08-07 NOTE — Progress Notes (Signed)
Summary: sore throat-dr Blossom Hoops   Phone Note Call from Patient Call back at [phone]   Caller: Patient Summary of Call: pt has sore throat,congested - husband was seen a few days ago & has strep -  Initial call taken by: Okey Regal [place],  December 26, 2006 8:57 AM  Follow-up for Phone Call        pt has appt today  dr Blossom Hoops Follow-up by: Kandice Hams,  December 26, 2006 10:37 AM

## 2010-08-07 NOTE — Letter (Signed)
Summary: [facility]  [facility]   Imported By: Maryln Gottron 10/28/2008 12:18:51  _____________________________________________________________________  External Attachment:    Type:   Image     Comment:   External Document

## 2010-08-07 NOTE — Progress Notes (Signed)
Summary: still sick  Phone Note Call from Patient Call back at [phone]   Caller: Patient Call For: [name] [name] Reason for Call: Talk to Nurse Complaint: Cough/Sore throat Summary of Call: relapse of symptoms, tight chest, feel like there is still some infection in there.   [facility] Initial call taken by: Eugene Gavia,  September 20, 2008 10:30 AM  Follow-up for Phone Call        Called spoke with pt.  Pt saw TP on 09/13/08.  Rx prednisone and zpak.  Pt has 1 dose pred left, has completed zpak.  Pt calling back states she was feeling better, breathing OK currently but still has some congestion in chest.  Requesting more abx to clear rest of infection, congestion.  Please advise. Thanks Follow-up by: Cloyde Reams RN,  September 20, 2008 10:43 AM  Additional Follow-up for Phone Call Additional follow up Details #1::        can refill zpack #1 take as directed. no refills Please contact office for sooner follow up if symptoms do not improve or worsen  Additional Follow-up by: Rubye Oaks NP,  September 20, 2008 11:25 AM    Additional Follow-up for Phone Call Additional follow up Details #2::    Called spoke with pt.  Advised TP auth zpak refill x1.  Pt instructed TCB for sooner appt if symptoms worsen or doesn't improve with 2nd course of abx.   Follow-up by: Cloyde Reams RN,  September 20, 2008 11:29 AM    Prescriptions: ZITHROMAX Z-PAK 250 MG TABS (AZITHROMYCIN) take as directed  #1 x 0   Entered by:   Cloyde Reams RN   Authorized by:   Rubye Oaks NP   Signed by:   Cloyde Reams RN on 09/20/2008   Method used:   Electronically to        [facility] (retail)       [address]       [place], [place]  [address]       Ph: [phone]       Fax: [phone]   RxID:   [id number]

## 2010-08-07 NOTE — Assessment & Plan Note (Signed)
Summary: ABD PAIN/PROBABLE CROHNS/YF    History of Present Illness Visit Type: Initial Consult Primary GI MD: Lina Sar MD Primary Provider: Nelwyn Salisbury MD Requesting Provider: Gershon Crane, MD Chief Complaint: Abdominal pain right sided x 2 months History of Present Illness:   This is a 45 year old white female with an abnormal CT scan of the abdomen which was done for evaluation of lower abdominal pain. A CT Scan in May 2011 showed an irregular area of the cecum as well as in sigmoid colon with mild lymph node enlargement in the right lower quadrant and bilateral ovarian cysts. Changes were suggestive of Crohn's disease. Her sister has had Crohn disease for 18 years and had surgery on her terminal ileum. Patient has never had a colonoscopy. A prior CT scan in July 2006 and again in December 2006 showed a right ovarian cyst and dilated esophagus due to a hiatal hernia. She has severe gastroesophageal reflux and had a Nissen fundoplication by Dr. Daphine Deutscher in 2007. She also has a history of delayed gastric emptying in 2005 showing 46% retained tracer in the stomach in 2 hours. She underwent a laparoscopic cholecystectomy in January 2006 after a HIDA scan showed a decreased ejection fraction of 11.2%. Patient has occasional right lower quadrant tenderness and occasional diarrhea. She denies any nausea or vomiting. She denies aphthous stomatitis. She has right sacroiliac tenderness.   GI Review of Systems    Reports abdominal pain and  weight loss.     Location of  Abdominal pain: right side. Weight loss of 50 pounds over 1 year.   Denies acid reflux, belching, bloating, chest pain, dysphagia with liquids, dysphagia with solids, heartburn, loss of appetite, nausea, vomiting, vomiting blood, and  weight gain.      Reports change in bowel habits, constipation, and  diarrhea.     Denies anal fissure, black tarry stools, diverticulosis, fecal incontinence, heme positive stool, hemorrhoids, irritable  bowel syndrome, jaundice, light color stool, liver problems, rectal bleeding, and  rectal pain.    Current Medications (verified): 1)  Advair Diskus 250-50 Mcg/dose Misc (Fluticasone-Salmeterol) .Marland Kitchen.. 1 Puff Two Times A Day 2)  Zyrtec-D Allergy & Congestion 5-120 Mg Tb12 (Cetirizine-Pseudoephedrine) .... Take 1 Tablet By Mouth Once A Day 3)  Tums Ultra 1000 1000 Mg  Chew (Calcium Carbonate Antacid) .... As Needed With Meals 4)  Proair Hfa 108 (90 Base) Mcg/act  Aers (Albuterol Sulfate) .... 2 Puffs Q4 As Needed Wheezing  Allergies (verified): 1)  ! Penicillin 2)  ! Sulfa  Past History:  Past Medical History: Allergic rhinitis Asthma, sees Dr. Cyril Mourning Depression, sees [facility] and Dr. Nolen Mu GERD UTI's sees Debbora Dus NP or Dr. Cherly Hensen for gyn exams normal sleep study 12-18-06 Anxiety Disorder  Past Surgical History: Reviewed history from 01/24/2010 and no changes required. Nissen fundiplication 1-07 per Dr. Renea Ee hernia repairs (2) in 1-09 per Dr. Daphine Deutscher Caesarean sections in 2001 and 2003 Cholecystectomy Tonsillectomy Tubal Ligation  Family History: Reviewed history from 01/24/2010 and no changes required. 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  The patient denies anemia, anxiety-new, arthritis/joint pain, blood in urine, breast changes/lumps, change in vision, confusion, cough, coughing up blood, depression-new, fainting,  fatigue, fever, headaches-new, hearing problems, heart murmur, heart rhythm changes, itching, menstrual pain, muscle pains/cramps, night sweats, nosebleeds, pregnancy symptoms, shortness of breath, skin rash, sleeping problems, sore throat, swelling of feet/legs, swollen lymph glands, thirst - excessive , urination - excessive , urination changes/pain, urine leakage, vision changes, and voice change.         Pertinent positive and negative review of systems were noted in the above HPI. All other ROS was otherwise negative.   Vital Signs:  Patient profile:   45 year old female Height:      62 inches Weight:      155.13 pounds BMI:     28.48 Pulse rate:   76 / minute Pulse rhythm:   regular BP sitting:   100 / 68  (left arm) Cuff size:   regular  Vitals Entered By: June McMurray CMA Duncan Dull) (January 30, 2010 10:42 AM)  Physical Exam  General:  Well developed, well nourished, no acute distress. Eyes:  PERRLA, no icterus. Mouth:  No deformity or lesions, dentition normal. Neck:  Supple; no masses or thyromegaly. Lungs:  Clear throughout to auscultation. Heart:  Regular rate and rhythm; no murmurs, rubs,  or bruits. Abdomen:  soft abdomen with normal active bowel sounds. Right lower quadrant tenderness without fullness. There is no rebound. Left lower and upper quadrants unremarkable. Rectal:  normal perianal area. Rectal tone is normal. Stool is soft and Hemoccult-negative. Msk:  tenderness of the right sacroiliac joint. Extremities:  No clubbing, cyanosis, edema or deformities noted. Skin:  no stigmata of chronic liver disease. Psych:  Alert and cooperative. Normal mood and affect.   Impression & Recommendations:  Problem # 1:  ABDOMINAL PAIN, RIGHT LOWER QUADRANT (ICD-789.03)  Patient has had a recent onset of intermittent low-grade right lower quadrant discomfort suggestive of an inflammatory process in the right lower quadrant. We need to rule out Crohn's disease of the cecum,  ileocecal valve or  terminal ileum. There is no evidence of obstruction. She has a strong family history of Crohn's disease in her sister. We will proceed with a colonoscopy and appropriate biopsies. She is reluctance to have an extensive  evaluation because she  has minimal symptoms. We had a discussion about how important it is  to make a diagnosis to rule out Crohn's disease at this point to prevent any future complications such as a small bowel obstruction. She will go ahead and schedule a colonoscopy.  Orders: Colonoscopy (Colon)  Problem # 2:  G E REFLUX (ICD-530.81) Patient is status post Nissen fundoplication in January 2007 by Dr. Daphine Deutscher. She is doing very well.  Problem # 3:  ANXIETY (ICD-300.00) Patient has history of anxiety and depression.  Patient Instructions: 1)  Schedule colonoscopy with biopsies. 2)  Consider IBD markers. Patient wants to wait because it is an expensive test. 3)  Depending on the results of the colonoscopy, further disposition will follow. 4)  Copy sent to : Dr Kathie Rhodes.Fry 5)  The medication list was reviewed and reconciled.  All changed / newly prescribed medications were explained.  A complete medication list was provided to the patient / caregiver. Prescriptions: DULCOLAX 5 MG  TBEC (BISACODYL) Day before procedure take 2 at 3pm and 2 at 8pm.  #4 x 0   Entered by:   Lamona Curl CMA (  AAMA)   Authorized by:   Hart Carwin MD   Signed by:   Lamona Curl CMA (AAMA) on 01/30/2010   Method used:   Electronically to        [facility]       [address]       [place], [place]  [address]       Ph: [phone]       Fax: [phone]   RxID:   [id number] REGLAN 10 MG  TABS (METOCLOPRAMIDE HCL) As per prep instructions.  #2 x 0   Entered by:   Lamona Curl CMA (AAMA)   Authorized by:   Hart Carwin MD   Signed by:   Lamona Curl CMA (AAMA) on 01/30/2010   Method used:    Electronically to        [facility]       [address]       [place], [place]  [address]       Ph: [phone]       Fax: [phone]   RxID:   [id number] MIRALAX   POWD (POLYETHYLENE GLYCOL 3350) As per prep  instructions.  #255 grams x 0   Entered by:   Lamona Curl CMA (AAMA)   Authorized by:   Hart Carwin MD   Signed by:   Lamona Curl CMA (AAMA) on 01/30/2010   Method used:   Electronically to        [facility]       [address]       [place], [place]  [address]       Ph: [phone]       Fax: [phone]   RxID:   [id number]

## 2010-08-07 NOTE — Assessment & Plan Note (Signed)
Summary: increased sob/mh   PCP:  Blossom Hoops  Chief Complaint:  increased SOB x3 days, some cough, and nonproductive.  History of Present Illness: 45 year old female with known history of Asthma since childhood, worse during & after pregnancy, previously well controlled on Symbicort. Hx of severe reflux for many yrs, improved  after Nissen's 2 yr ago  02/08/08-- Presents for 3 days of dry cough, wheezing that began 3 days ago. Pt woke up choking in middle of night, since then throat feels raw, has had dry cough, and felt wheezy.  Denies chest pain, dyspnea, orthopnea, hemoptysis, fever, n/v/d, edema, purulent sputum.      Updated Prior Medication List: ZYRTEC-D ALLERGY & CONGESTION 5-120 MG TB12 (CETIRIZINE-PSEUDOEPHEDRINE) Take 1 tablet by mouth twice a day PROAIR HFA 108 (90 BASE) MCG/ACT  AERS (ALBUTEROL SULFATE) 2 puffs q4 as needed wheezing [BMN] ZOLOFT 100 MG TABS (SERTRALINE HCL) 1 by mouth daily SYMBICORT 160-4.5 MCG/ACT  AERO (BUDESONIDE-FORMOTEROL FUMARATE) 2  puffs  two times a day WELLBUTRIN XL 300 MG  TB24 (BUPROPION HCL) once daily TUMS ULTRA 1000 1000 MG  CHEW (CALCIUM CARBONATE ANTACID) 2 at bedtime ZANTAC 75 75 MG  TABS (RANITIDINE HCL) as needed  Current Allergies (reviewed today): ! PENICILLIN ! SULFA  Past Medical History:    Reviewed history from 12/15/2007 and no changes required:       Allergic rhinitis       Asthma, sees Dr. Earlene Plater       Depression, sees [facility] and Dr. Nolen Mu       GERD       UTI's       sees Debbora Dus NP or Dr. Cherly Hensen for gyn exams       normal sleep study 12-18-06   Family History:    Reviewed history from 12/15/2007 and no changes required:       Family History of Alcoholism/Addiction       Family History of Arthritis       Family History Breast cancer 1st degree relative <50       Family History of CAD Female 1st degree relative <50       Family History Hypertension                     mother living with rheumatoid  arthritis       father living with no medical history       sister living with thyroid number       sister with chron's  Social History:    Reviewed history from 12/15/2007 and no changes required:       Married       Never Smoked       Alcohol use-no       stay-at-home mom       2 chidren   Risk Factors: Tobacco use:  never Alcohol use:  no   Review of Systems      See HPI   Vital Signs:  Patient Profile:   45 Years Old Female Height:     62 inches Weight:      194.25 pounds O2 Sat:      98 % O2 treatment:    Room Air Temp:     99.1 degrees F oral Pulse rate:   104 / minute BP sitting:   124 / 78  (left arm) Cuff size:   regular  Vitals Entered By: Boone Master CNA (February 08, 2008 4:20 PM)  Is Patient Diabetic? No Comments Medications reviewed with patient Boone Master CNA  February 08, 2008 4:21 PM      Physical Exam  GENERAL:  A/Ox3; pleasant & cooperative.NAD HEENT:  [place]/AT, EOM-wnl, PERRLA, EACs-clear, TMs-wnl, NOSE-clear, THROAT-clear & wnl. NECK:  Supple w/ fair ROM; no JVD; normal carotid impulses w/o bruits; no thyromegaly or nodules palpated; no lymphadenopathy. CHEST:  Coarse BS bilaterally, no sig wheezing HEART:  RRR, no m/r/g  heard ABDOMEN:  Soft & nt; nml bowel sounds; no organomegaly or masses detected. EXT: Warm bilat,  no calf pain, edema, clubbing, pulses intact Skin: no rash/lesion       Impression & Recommendations:  Problem # 1:  ASTHMATIC BRONCHITIS, ACUTE (ICD-466.0) Asthma flare questionalbe secondary reflux episode.  REC: Xopenex neb given.  Prednisone taper over next week Begin Nexium 40mg  once daily or Prilosec 20mg  once daily  Omnicef to have on hold if symptoms worsen with cough/congestion -dsicolored mucus.  Mucinex DM as needed cough Please contact office for sooner follow up if symptoms do not improve or worsen  follow up Dr. Vassie Loll 4 weeks.   Her updated medication list for this problem includes:     Zyrtec-d Allergy & Congestion 5-120 Mg Tb12 (Cetirizine-pseudoephedrine) .Marland Kitchen... Take 1 tablet by mouth twice a day    Proair Hfa 108 (90 Base) Mcg/act Aers (Albuterol sulfate) .Marland Kitchen... 2 puffs q4 as needed wheezing    Symbicort 160-4.5 Mcg/act Aero (Budesonide-formoterol fumarate) .Marland Kitchen... 2  puffs  two times a day    Omnicef 300 Mg Caps (Cefdinir) .Marland Kitchen... 2 by mouth once daily  Orders: Nebulizer Tx ([address]) Est. Patient Level IV ([address])   Medications Added to Medication List This Visit: 1)  Prednisone 10 Mg Tabs (Prednisone) .... 4 tabs for 2 days, then 3 tabs for 2 days, 2 tabs for 2 days, then 1 tab for 2 days, then stop 2)  Omnicef 300 Mg Caps (Cefdinir) .... 2 by mouth once daily  Complete Medication List: 1)  Zyrtec-d Allergy & Congestion 5-120 Mg Tb12 (Cetirizine-pseudoephedrine) .... Take 1 tablet by mouth twice a day 2)  Proair Hfa 108 (90 Base) Mcg/act Aers (Albuterol sulfate) .... 2 puffs q4 as needed wheezing 3)  Zoloft 100 Mg Tabs (Sertraline hcl) .Marland Kitchen.. 1 by mouth daily 4)  Symbicort 160-4.5 Mcg/act Aero (Budesonide-formoterol fumarate) .... 2  puffs  two times a day 5)  Wellbutrin Xl 300 Mg Tb24 (Bupropion hcl) .... Once daily 6)  Tums Ultra 1000 1000 Mg Chew (Calcium carbonate antacid) .... 2 at bedtime 7)  Zantac 75 75 Mg Tabs (Ranitidine hcl) .... As needed 8)  Prednisone 10 Mg Tabs (Prednisone) .... 4 tabs for 2 days, then 3 tabs for 2 days, 2 tabs for 2 days, then 1 tab for 2 days, then stop 9)  Omnicef 300 Mg Caps (Cefdinir) .... 2 by mouth once daily   Patient Instructions: 1)  Prednisone taper over next week 2)  Begin Nexium 40mg  once daily or Prilosec 20mg  once daily  3)  Omnicef to have on hold if symptoms worsen with cough/congestion -dsicolored mucus.  4)  Mucinex DM as needed cough 5)  Please contact office for sooner follow up if symptoms do not improve or worsen  6)  follow up Dr. Vassie Loll 4 weeks.    Prescriptions: OMNICEF 300 MG  CAPS (CEFDINIR) 2 by mouth once  daily  #14 x 0   Entered and Authorized by:   Rubye Oaks NP   Signed by:  Tammy Parrett NP on 02/08/2008   Method used:   Electronically sent to ...       [facility]       [address]       [place], [place]  [address]       Ph: [phone]       Fax: [phone]   RxID:   [id number] PREDNISONE 10 MG  TABS (PREDNISONE) 4 tabs for 2 days, then 3 tabs for 2 days, 2 tabs for 2 days, then 1 tab for 2 days, then stop  #20 x 0   Entered and Authorized by:   Rubye Oaks NP   Signed by:   Tammy Parrett NP on 02/08/2008   Method used:   Electronically sent to ...       [facility]       [address]       [place], [place]  [address]       Ph: [phone]       Fax: [phone]   RxID:   [id number]  ]

## 2010-08-07 NOTE — Medication Information (Signed)
Summary: prior auth pantoprazole/[facility]  prior auth pantoprazole/[facility]   Imported By: Lester Copperton 03/24/2008 08:28:59  _____________________________________________________________________  External Attachment:    Type:   Image     Comment:   External Document

## 2010-08-07 NOTE — Assessment & Plan Note (Signed)
Summary: tightness in chest, cold/jd   Primary Provider/Referring Provider:  Nelwyn Salisbury MD  CC:  follow up from OV with PCP 05-29-09 for cold symptoms, was given pred taper and zpak.  states still having tightness in chest, and still SOB and some nasal congestion.Marland Kitchen  History of Present Illness: 45  year old female with known history of Asthma since childhood, worse during & after pregnancy, previously well controlled on Symbicort. Hx of severe reflux for many yrs, improved  after Nissen's 2 yr ago  02/08/08-- Pt woke up choking in middle of night, since then throat feels raw, has had dry cough, and felt wheezy >> steroid exacerbation.  9/1>> much improved, but c/o chest tightness, spirometry nml, FEv1 99% Breakthrough reflux 4-5/wk, takes tums every night with ranitidine; reglan did not work (dysmotility noted on studies by Dr. Daphine Deutscher post Nissen's). -describes wt gain 60 lbs over last 5 yrs with zoloft & wellbutrin   June 13, 2008--returns today for follow up. Improved with less chest tightness, using proAir 1-2 x/wk. Now on omeprazole 20mg  once daily. less reflux.   September 13, 2008--acute sick visit today. Pt has been sick for 5 days with nasal congestion, productive cough with yellow sputum, raspy voice, SOB with exertion, wheezing, sore throat.  Pt is utilizing her Advair daily, and has been utlizing her Albuterol inhaler regularly every 4- 6 hours for SOB and wheezing.  Five days ago, she was exposd to cold night air and dust.   June 15, 2009 --Presents for follow up from OV with PCP 05-29-09 for cold symptoms, was given pred taper and zpak.  states still having tightness in chest, still SOB and some nasal congestion. SHe has stopped zoloft/wellbutrin earlier this year and has lost 30lbs. GERD under good control. She does complain that left side/chest wall "feels like something is inside it", wants cxr. She has hard time describing, going on for several months more prominent recently  after URI. No hemoptysis, never smoker., no exertional chest pain, diaphoresis, not related to food, no overt reflux/bloating or gas. no rash. Mammogram neg, BSE neg. Denies  dyspnea, orthopnea, hemoptysis, fever, n/v/d, edema, headache.   Medications Prior to Update: 1)  Advair Diskus 250-50 Mcg/dose Misc (Fluticasone-Salmeterol) .Marland Kitchen.. 1 Puff Two Times A Day 2)  Omeprazole 20 Mg Cpdr (Omeprazole) .Marland Kitchen.. 1 By Mouth Once Daily 3)  Zyrtec-D Allergy & Congestion 5-120 Mg Tb12 (Cetirizine-Pseudoephedrine) .... Take 1 Tablet By Mouth Twice A Day 4)  Tums Ultra 1000 1000 Mg  Chew (Calcium Carbonate Antacid) .... As Needed With Meals 5)  Proair Hfa 108 (90 Base) Mcg/act  Aers (Albuterol Sulfate) .... 2 Puffs Q4 As Needed Wheezing 6)  Imitrex 100 Mg Tabs (Sumatriptan Succinate) .... As Needed For Ha  Current Medications (verified): 1)  Advair Diskus 250-50 Mcg/dose Misc (Fluticasone-Salmeterol) .Marland Kitchen.. 1 Puff Two Times A Day 2)  Omeprazole 20 Mg Cpdr (Omeprazole) .Marland Kitchen.. 1 By Mouth Once Daily As Needed 3)  Zyrtec-D Allergy & Congestion 5-120 Mg Tb12 (Cetirizine-Pseudoephedrine) .... Take 1 Tablet By Mouth Twice A Day 4)  Tums Ultra 1000 1000 Mg  Chew (Calcium Carbonate Antacid) .... As Needed With Meals 5)  Proair Hfa 108 (90 Base) Mcg/act  Aers (Albuterol Sulfate) .... 2 Puffs Q4 As Needed Wheezing  Allergies (verified): 1)  ! Penicillin 2)  ! Sulfa  Past History:  Past Medical History: Last updated: 03/08/2008 Allergic rhinitis Asthma, sees Dr. Cyril Mourning Depression, sees [facility] and Dr. Nolen Mu GERD UTI's  sees Debbora Dus NP or Dr. Cherly Hensen for gyn exams normal sleep study 12-18-06  Past Surgical History: Last updated: 12/15/2007 Nissen fundiplication 1-07 per Dr. Renea Ee hernia repairs (2) in 1-09 per Dr. 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  states still having tightness in chest, still SOB and some nasal congestion. Is Patient Diabetic? No Comments Medications reviewed with patient Daytime contact number verified with patient. Boone Master CNA  June 15, 2009 10:06 AM    Physical Exam  Additional Exam:  GEN: A/Ox3; pleasant , NAD HEENT:  H[place]/AT, , EACs-clear, TMs-wnl, NOSE-reddened and slight edema, THROAT-clear NECK:  Supple w/ fair ROM; no JVD; normal carotid impulses w/o bruits; no thyromegaly or nodules palpated; no lymphadenopathy. CHEST:  CTA w/ no wheezing , mild tenderness along upper /lat chest wall , no eccymosis HEART:  RRR, no m/r/g   ABDOMEN:  Soft & nt; nml bowel sounds; no organomegaly or masses detected., several well healed surgical scars along upper abd. no guarding or rebound.  EXT: Warm bil,  no calf pain, edema, clubbing, pulses intact     Impression & Recommendations:  Problem # 1:  ASTHMATIC BRONCHITIS, ACUTE (ICD-466.0)  Recent flare, now improving  Has some left sided  pleuritic pain ? etiology  will check cxr  recent neg mammogram.  REC:  Mucinex DM two times a day as needed  Saline nasal rinses as needed  Continue on Advair , brush gargle and rinse  I will call you xray results.  Please contact office for sooner follow up if symptoms do not improve or worsen  follow up Dr. Vassie Loll 4-6 months  Her updated medication list for this problem includes:    Advair Diskus 250-50 Mcg/dose Misc (Fluticasone-salmeterol) .Marland Kitchen... 1 puff two times a day    Zyrtec-d Allergy & Congestion 5-120 Mg Tb12 (Cetirizine-pseudoephedrine) .Marland Kitchen... Take 1 tablet by mouth twice a day    Proair Hfa 108 (90 Base) Mcg/act Aers (Albuterol sulfate) .Marland Kitchen... 2 puffs q4 as needed wheezing  Orders: T-2 View CXR (71020TC) Est. Patient Level IV (21308)  Medications Added to Medication List This Visit: 1)  Omeprazole 20 Mg Cpdr (Omeprazole) .Marland Kitchen.. 1 by mouth once daily as needed  Complete Medication List: 1)  Advair Diskus 250-50 Mcg/dose Misc (Fluticasone-salmeterol) .Marland Kitchen.. 1 puff two times a  day 2)  Omeprazole 20 Mg Cpdr (Omeprazole) .Marland Kitchen.. 1 by mouth once daily as needed 3)  Zyrtec-d Allergy & Congestion 5-120 Mg Tb12 (Cetirizine-pseudoephedrine) .... Take 1 tablet by mouth twice a day 4)  Tums Ultra 1000 1000 Mg Chew (Calcium carbonate antacid) .... As needed with meals 5)  Proair Hfa 108 (90 Base) Mcg/act Aers (Albuterol sulfate) .... 2 puffs q4 as needed wheezing  Patient Instructions: 1)  Mucinex DM two times a day as needed  2)  Saline nasal rinses as needed  3)  Continue on Advair , brush gargle and rinse  4)  I will call you xray results.  5)  Please contact office for sooner follow up if symptoms do not improve or worsen  6)  follow up Dr. Vassie Loll 4-6 months

## 2010-08-07 NOTE — Assessment & Plan Note (Signed)
Summary: 1 MONTH/APC   PCP:  Blossom Hoops  Chief Complaint:  nasal drainage.....had hernia repair.....Marland Kitchen  History of Present Illness: ASthma since childhood, worse during & after pregnancy, improved with symbicort, uses albuterol occ for breakthrough symptoms. No nocturnal wheezing. Severe reflux for many yrs, improved much after Nissen's 2 yr ago, now about 1-2/ week.   Current Allergies: ! PENICILLIN ! SULFA  Past Surgical History:    Reviewed history and no changes required:       Nissens fundoplication       Ventral hernia repair 1/09 Current Problems:  G E REFLUX (ICD-530.81) ASTHMATIC BRONCHITIS, ACUTE (ICD-466.0) PHARYNGITIS, ACUTE (ICD-462) DEPRESSION (ICD-311) ASTHMA (ICD-493.90) ALLERGIC RHINITIS (ICD-477.9)      Review of Systems  The patient denies anorexia, fever, weight loss, vision loss, decreased hearing, hoarseness, chest pain, syncope, dyspnea on exhertion, peripheral edema, prolonged cough, hemoptysis, abdominal pain, melena, hematochezia, severe indigestion/heartburn, hematuria, incontinence, genital sores, muscle weakness, suspicious skin lesions, transient blindness, difficulty walking, depression, unusual weight change, abnormal bleeding, enlarged lymph nodes, angioedema, breast masses, and testicular masses.     Vital Signs:  Patient Profile:   45 Years Old Female Height:     62 inches Weight:      191.2 pounds BMI:     35.10 O2 Sat:      98 % O2 treatment:    Room Air Temp:     98.4 degrees F oral Pulse rate:   105 / minute BP sitting:   110 / 78  (left arm) Cuff size:   regular  Pt. in pain?   no                  Physical Exam  General:     HEENT - no thrush, no post nasal drip No JVD, no lymphadenopathy CVS- s1s2 nml, no murmur RS- clear, no crackles or rhonchi Abd- soft, non-tender, no organomegaly CNS- non focal Ext - no edema      Impression & Recommendations:  Problem # 1:  ASTHMA (ICD-493.90) ct meds, discussed  plan of action for exacerbation. Given z-pak for such an event Spirometry nml in past, will rpt on next visit. Asthma has improved much since GERD better, after Nissen's  The following medications were removed from the medication list:    Ventolin Hfa 108 (90 Base) Mcg/act Aers (Albuterol sulfate) .Marland Kitchen... As needed  Her updated medication list for this problem includes:    Symbicort 80-4.5 Mcg/act Aero (Budesonide-formoterol fumarate) ..... Inhale two puffs twice daily    Proair Hfa 108 (90 Base) Mcg/act Aers (Albuterol sulfate) .Marland Kitchen... 2 puffs q4 as needed wheezing  Orders: Est. Patient Level III ([address]) Spirometry (Pre & Post) [phone])   Problem # 2:  G E REFLUX (ICD-530.81) s/p Nissen's '06. Breakthrough symptoms respond to reglan, will add H2 blocker Her updated medication list for this problem includes:    Ra Acid Reducer Max St 150 Mg Tabs (Ranitidine hcl) .Marland Kitchen... At bedtime   Medications Added to Medication List This Visit: 1)  Zithromax Z-pak 250 Mg Tabs (Azithromycin) 2)  Ra Acid Reducer Max St 150 Mg Tabs (Ranitidine hcl) .... At bedtime   Patient Instructions: 1)  Please schedule a follow-up appointment in 6 months. 2)  You have been asked to make an appointment for a Pulmonary Function Test (Breathing Test) prior to or at the time of your next visit. Use medications as usual unless otherwise instructed.    Prescriptions: RA ACID REDUCER MAX ST 150 MG  TABS (  RANITIDINE HCL) at bedtime  #60 x 3   Entered and Authorized by:   Comer Locket. Vassie Loll MD   Signed by:   Comer Locket Vassie Loll MD on 07/29/2007   Method used:   Electronically sent to ...       [facility]       [address]       [place], [place]  [address]       Ph: [phone]       Fax: [phone]   RxID:   [id number] ZITHROMAX Z-PAK 250 MG  TABS (AZITHROMYCIN)   #5 x 0   Entered and Authorized by:   Comer Locket Vassie Loll MD   Signed by:   Comer Locket Vassie Loll MD on 07/29/2007   Method  used:   Electronically sent to ...       [facility]       [address]       [place], [place]  [address]       Ph: [phone]       Fax: [phone]   RxID:   [id number]  ]

## 2010-08-07 NOTE — Letter (Signed)
Summary: [facility] Instructions  [facility]  [address] [place], [place] [address]   Phone: [phone]  Fax: [phone]       [name] [name]    Jul 20, 1965    MRN: [id number]       Procedure Day /Date: 03/14/10 Wednesday     Arrival Time: 1:00 pm     Procedure Time: 2:00 pm     Location of Procedure:                    _x_  [facility] (4th Floor)  PREPARATION FOR COLONOSCOPY WITH MIRALAX  Starting 5 days prior to your procedure (03/09/10) do not eat nuts, seeds, popcorn, corn, beans, peas,  salads, or any raw vegetables.  Do not take any fiber supplements (e.g. Metamucil, Citrucel, and Benefiber). ____________________________________________________________________________________________________   THE DAY BEFORE YOUR PROCEDURE         DATE: 03/13/10 DAY: Tuesday  1   Drink clear liquids the entire day-NO SOLID FOOD  2   Do not drink anything colored red or purple.  Avoid juices with pulp.  No orange juice.  3   Drink at least 64 oz. (8 glasses) of fluid/clear liquids during the day to prevent dehydration and help the prep work efficiently.  CLEAR LIQUIDS INCLUDE: Water Jello Ice Popsicles Tea (sugar ok, no milk/cream) Powdered fruit flavored drinks Coffee (sugar ok, no milk/cream) Gatorade Juice: apple, white grape, white cranberry  Lemonade Clear bullion, consomm, broth Carbonated beverages (any kind) Strained chicken noodle soup Hard Candy  4   Mix the entire bottle of Miralax with 64 oz. of Gatorade/Powerade in the morning and put in the refrigerator to chill.  5   At 3:00 pm take 2 Dulcolax/Bisacodyl tablets.  6   At 4:30 pm take one Reglan/Metoclopramide tablet.  7  Starting at 5:00 pm drink one 8 oz glass of the Miralax mixture every 15-20 minutes until you have finished drinking the entire 64 oz.  You should finish drinking prep around 7:30 or 8:00 pm.  8   If you are nauseated, you may take the 2nd Reglan/Metoclopramide tablet  at 6:30 pm.        9    At 8:00 pm take 2 more DULCOLAX/Bisacodyl tablets.        THE DAY OF YOUR PROCEDURE      DATE:  03/14/10 DAY: Wednesday  You may drink clear liquids until 12:00 pm  (2 HOURS BEFORE PROCEDURE).   MEDICATION INSTRUCTIONS  Unless otherwise instructed, you should take regular prescription medications with a small sip of water as early as possible the morning of your procedure.  Diabetic patients - see separate instructions.  Stop taking Plavix or Aggrenox on  _  _  (7 days before procedure).           Stop taking Coumadin on  _  _  (5 days before procedure).  Additional medication instructions: _  _         OTHER INSTRUCTIONS  You will need a responsible adult at least 43 years of age to accompany you and drive you home.   This person must remain in the waiting room during your procedure.  Wear loose fitting clothing that is easily removed.  Leave jewelry and other valuables at home.  However, you may wish to bring a book to read or an iPod/MP3 player to listen to music as you wait for your procedure to start.  Remove all body piercing  jewelry and leave at home.  Total time from sign-in until discharge is approximately 2-3 hours.  You should go home directly after your procedure and rest.  You can resume normal activities the day after your procedure.  The day of your procedure you should not:   Drive   Make legal decisions   Operate machinery   Drink alcohol   Return to work  You will receive specific instructions about eating, activities and medications before you leave.   The above instructions have been reviewed and explained to me by   _______________________    I fully understand and can verbalize these instructions _____________________________ Date _______

## 2010-08-07 NOTE — Medication Information (Signed)
Summary: PPI Step Therapy for Pantoprazole/[facility]  PPI Step Therapy for Pantoprazole/[facility]   Imported By: Esmeralda Links D'jimraou 03/28/2008 11:52:57  _____________________________________________________________________  External Attachment:    Type:   Image     Comment:   External Document

## 2010-08-07 NOTE — Assessment & Plan Note (Signed)
Summary: hurt ankle/mhf   Vital Signs:  Patient profile:   45 year old female Weight:      195 pounds Temp:     97.9 degrees F oral Pulse rate:   104 / minute BP sitting:   102 / 74  (left arm) Cuff size:   large  Vitals Entered By: Alfred Levins, CMA (September 21, 2008 4:29 PM) CC: hurt ankle 2wks ago and has been swollen and painful since   History of Present Illness: Here for 2 things. First she has had pain in the top of her left foot for 2 weeks, ever since she was playing tennis and landed on her foot at an odd angle. She felt and heard a "pop" at that moment, and it has hurt ever since. Had some swelling and bruising at first but not now. Motrin helps. Also she has had URI symptoms fir several weeks with a dry cough, chest congestion, and wheezing. No fever. On her usual inhalers. Saw Pulmonary on 09-13-08 and was given a nebulizer, a Zpack, and a prednisone taper. She now feels a bit better but still has congestion and a dry cough.   Allergies: 1)  ! Penicillin 2)  ! Sulfa  Past History:  Past Medical History:    Reviewed history from 03/08/2008 and no changes required:    Allergic rhinitis    Asthma, sees Dr. Cyril Mourning    Depression, sees [facility] and Dr. Nolen Mu    GERD    UTI's    sees Debbora Dus NP or Dr. Cherly Hensen for gyn exams    normal sleep study 12-18-06  Review of Systems  The patient denies anorexia, fever, weight loss, weight gain, vision loss, decreased hearing, hoarseness, chest pain, syncope, dyspnea on exertion, peripheral edema, headaches, hemoptysis, abdominal pain, melena, hematochezia, severe indigestion/heartburn, hematuria, incontinence, genital sores, muscle weakness, suspicious skin lesions, transient blindness, difficulty walking, depression, unusual weight change, abnormal bleeding, enlarged lymph nodes, angioedema, breast masses, and testicular masses.    Physical Exam  General:  Well-developed,well-nourished,in no acute distress;  alert,appropriate and cooperative throughout examination Head:  Normocephalic and atraumatic without obvious abnormalities. No apparent alopecia or balding. Eyes:  No corneal or conjunctival inflammation noted. EOMI. Perrla. Funduscopic exam benign, without hemorrhages, exudates or papilledema. Vision grossly normal. Ears:  External ear exam shows no significant lesions or deformities.  Otoscopic examination reveals clear canals, tympanic membranes are intact bilaterally without bulging, retraction, inflammation or discharge. Hearing is grossly normal bilaterally. Nose:  External nasal examination shows no deformity or inflammation. Nasal mucosa are pink and moist without lesions or exudates. Mouth:  Oral mucosa and oropharynx without lesions or exudates.  Teeth in good repair. Neck:  No deformities, masses, or tenderness noted. Lungs:  scattered rhonchi and wheezes, no rales Extremities:  left foot is tender across the forefoot, no masses or swelling, no crepitus   Impression & Recommendations:  Problem # 1:  FOOT PAIN (ICD-729.5)  Orders: T-Foot Left Min 3 Views (73630TC)  Problem # 2:  ASTHMATIC BRONCHITIS, ACUTE (ICD-466.0)  The following medications were removed from the medication list:    Zithromax Z-pak 250 Mg Tabs (Azithromycin) .Marland Kitchen... Take as directed    Promethazine-codeine 6.25-10 Mg/74ml Syrp (Promethazine-codeine) .Marland Kitchen... 1-2  tsp every 4-6 hr as needed cough Her updated medication list for this problem includes:    Advair Diskus 250-50 Mcg/dose Misc (Fluticasone-salmeterol) .Marland Kitchen... 1 puff two times a day    Zyrtec-d Allergy & Congestion 5-120 Mg Tb12 (Cetirizine-pseudoephedrine) .Marland Kitchen... Take  1 tablet by mouth twice a day    Proair Hfa 108 (90 Base) Mcg/act Aers (Albuterol sulfate) .Marland Kitchen... 2 puffs q4 as needed wheezing    Levaquin 500 Mg Tabs (Levofloxacin) ..... Once daily  Complete Medication List: 1)  Advair Diskus 250-50 Mcg/dose Misc (Fluticasone-salmeterol) .Marland Kitchen.. 1 puff two  times a day 2)  Omeprazole 20 Mg Cpdr (Omeprazole) .Marland Kitchen.. 1 by mouth once daily 3)  Zoloft 100 Mg Tabs (Sertraline hcl) .... 2 by mouth daily 4)  Wellbutrin Xl 300 Mg Tb24 (Bupropion hcl) .... Once daily 5)  Zyrtec-d Allergy & Congestion 5-120 Mg Tb12 (Cetirizine-pseudoephedrine) .... Take 1 tablet by mouth twice a day 6)  Tums Ultra 1000 1000 Mg Chew (Calcium carbonate antacid) .... As needed with meals 7)  Proair Hfa 108 (90 Base) Mcg/act Aers (Albuterol sulfate) .... 2 puffs q4 as needed wheezing 8)  Levaquin 500 Mg Tabs (Levofloxacin) .... Once daily 9)  Sterapred Ds 12 Day 10 Mg Tabs (Prednisone) .... As directed  Patient Instructions: 1)  There is a good chance that she has a stress fracture in the foot. Told her to avoid any running or playing tennis for at least several weeks. Use ice and Motrin as needed . Wear supportive shoes. Get Xrays of the foot tomorrow. She still has a bronchitis. Try some Levaquin and another steroid pack. 2)  Please schedule a follow-up appointment as needed .  Prescriptions: STERAPRED DS 12 DAY 10 MG TABS (PREDNISONE) as directed  #1 x 0   Entered and Authorized by:   Nelwyn Salisbury MD   Signed by:   Nelwyn Salisbury MD on 09/21/2008   Method used:   Electronically to        [facility]       [address]       [place], [place]  [address]       Ph: [phone]       Fax: [phone]   RxID:   [id number] LEVAQUIN 500 MG TABS (LEVOFLOXACIN) once daily  #10 x 0   Entered and Authorized by:   Nelwyn Salisbury MD   Signed by:   Nelwyn Salisbury MD on 09/21/2008   Method used:   Electronically to        [facility]       [address]       [place], [place]  [address]       Ph: [phone]       Fax: [phone]   RxID:   [id number]

## 2010-08-09 NOTE — Assessment & Plan Note (Signed)
Summary: ROV/KLW   Visit Type:  Acute visit Primary Provider/Referring Provider:  Nelwyn Salisbury MD  CC:  Pt c/o chest tightness, unable to take deep breaths, dry cough, chest congestion x 3 weeks, PND, neck tender to touch x 2 days. Denies fever, n/v/d, and sweats. Is using Symbicort 1 puff twice daily.  History of Present Illness: 44/F, with known history of Asthma since childhood, worse during & after pregnancy 2001.  Triggers - GERD vs allergies Hx of severe reflux for many yrs, improved  after Nissen's 2 yr ago 02/08/08-- Pt woke up choking in middle of night, since then throat feels raw, has had dry cough, and felt wheezy >> steroids  9/1>> much improved, but c/o chest tightness, spirometry nml, FEv1 99% Breakthrough reflux 4-5/wk, takes tums every night with ranitidine; reglan did not work (dysmotility noted on studies by Dr. Daphine Deutscher post Nissen's in 2008). 2 exacerbations in 2010 requiring steroids.  Nov 06, 2009 9:23 AM  [facility] has stopped zoloft/wellbutrin and has lost 30lbs. GERD under good control - off omeprazole, on tums twice/ month.   July 04, 2010 9:34 AM  c/o URI 3 wks ago, now cough & wheezing c/o chest tightness, unable to take deep breaths, dry cough, chest congestion x 3 weeks, PND, neck tender to touch x 2 days. Denies fever, n/v/d, and sweats. Is using Symbicort 1 puff twice daily. Denies heartburn, chest pain, dyspnea, orthopnea, hemoptysis, fever, n/v/d, edema, headache.   Preventive Screening-Counseling & Management  Alcohol-Tobacco     Smoking Status: never  Current Medications (verified): 1)  Symbicort 160-4.5 Mcg/act Aero (Budesonide-Formoterol Fumarate) .Marland Kitchen.. 1 Puff Two Times A Day  Brush, Rinse and Gargle 2)  Zyrtec-D Allergy & Congestion 5-120 Mg Tb12 (Cetirizine-Pseudoephedrine) .... Take 1 Tablet By Mouth Once A Day 3)  Tums Ultra 1000 1000 Mg  Chew (Calcium Carbonate Antacid) .... As Needed With Meals 4)  Proair Hfa 108 (90 Base) Mcg/act  Aers  (Albuterol Sulfate) .... 2 Puffs Q4 As Needed Wheezing  Allergies (verified): 1)  ! Penicillin 2)  ! 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 Nolen Mu GERD UTI's sees Debbora Dus NP or Dr. Cherly Hensen for gyn exams normal sleep study 12-18-06 Anxiety Disorder  Review of Systems       The patient complains of dyspnea on exertion.  The patient denies anorexia, fever, weight loss, weight gain, vision loss, decreased hearing, hoarseness, chest pain, syncope, peripheral edema, prolonged cough, headaches, hemoptysis, abdominal pain, melena, hematochezia, severe indigestion/heartburn, hematuria, muscle weakness, suspicious skin lesions, transient blindness, difficulty walking, depression, unusual weight change, abnormal bleeding, enlarged lymph nodes, and angioedema.    Vital Signs:  Patient profile:   45 year old female Height:      62 inches Weight:      154.6 pounds BMI:     28.38 O2 Sat:      96 % on Room air Temp:     98.0 degrees F oral Pulse rate:   86 / minute BP sitting:   110 / 70  (left arm) Cuff size:   regular  Vitals Entered By: Zackery Barefoot CMA (July 04, 2010 9:15 AM)  O2 Flow:  Room air CC: Pt c/o chest tightness, unable to take deep breaths, dry cough, chest congestion x 3 weeks, PND, neck tender to  touch x 2 days. Denies fever, n/v/d, sweats. Is using Symbicort 1 puff twice daily Comments Medications reviewed with patient Verified contact number and pharmacy with patient Zackery Barefoot CMA  July 04, 2010 9:20 AM    Physical Exam  Additional Exam:  GEN: A/Ox3; pleasant , NAD HEENT:  [place]/AT, , EACs-clear, TMs-wnl, NOSE-clear, THROAT-clear NECK:  Supple w/ fair ROM; no JVD; normal carotid impulses w/o bruits; no thyromegaly or  nodules palpated; no lymphadenopathy. RESP  Clear to P & A; w/o, wheezes/ rales, faint  rhonchi. CARD:  RRR, no m/r/g   Musco: Warm bil,  no calf tenderness edema, clubbing, pulses intact    Impression & Recommendations:  Problem # 1:  ASTHMATIC BRONCHITIS, ACUTE (ICD-466.0)  precipitated by URI Increase symbicort to 2 puffs two times a day till flare is resolved Prednisone short burst Z-pak x 5 ds Her updated medication list for this problem includes:    Symbicort 160-4.5 Mcg/act Aero (Budesonide-formoterol fumarate) .Marland Kitchen... 1 puff two times a day  brush, rinse and gargle    Zyrtec-d Allergy & Congestion 5-120 Mg Tb12 (Cetirizine-pseudoephedrine) .Marland Kitchen... Take 1 tablet by mouth once a day    Proair Hfa 108 (90 Base) Mcg/act Aers (Albuterol sulfate) .Marland Kitchen... 2 puffs q4 as needed wheezing    Azithromycin 500 Mg Tabs (Azithromycin) ..... Once daily  Orders: Est. Patient Level III (08657) Prescription Created Electronically [phone])  Medications Added to Medication List This Visit: 1)  Symbicort 160-4.5 Mcg/act Aero (Budesonide-formoterol fumarate) .Marland Kitchen.. 1 puff two times a day  brush, rinse and gargle 2)  Azithromycin 500 Mg Tabs (Azithromycin) .... Once daily 3)  Prednisone 10 Mg Tabs (Prednisone) .... 2 tabs once daily x 5 , then 1 tab x 5 d  Patient Instructions: 1)  Copy sent to: Clent Ridges 2)  Please schedule a follow-up appointment in 3 months with TP 3)  Increase symbicort to 2 puffs two times a day till flare is resolved 4)  Prednisone short burst 5)  Z-pak x 5 ds Prescriptions: PREDNISONE 10 MG TABS (PREDNISONE) 2 tabs once daily x 5 , then 1 tab x 5 d  #15 x 0   Entered and Authorized by:   Comer Locket Vassie Loll MD   Signed by:   Comer Locket Vassie Loll MD on 07/04/2010   Method used:   Electronically to        [facility]       [address]       [place], [place]  [address]       Ph: [phone]       Fax: [phone]   RxID:    [id number] AZITHROMYCIN 500 MG TABS (AZITHROMYCIN) once daily  #5 x 0   Entered and Authorized by:   Comer Locket Vassie Loll MD   Signed by:   Comer Locket Vassie Loll MD on 07/04/2010   Method used:   Electronically to        [facility]       [address]       [place], [place]  [address]       Ph: [phone]       Fax: [phone]   RxID:   [id number]     Not Administered:    Influenza Vaccine not given due to: declined

## 2010-09-20 LAB — CARDIAC PANEL(CRET KIN+CKTOT+MB+TROPI)
CK, MB: 0.7 ng/mL (ref 0.3–4.0)
CK, MB: 0.9 ng/mL (ref 0.3–4.0)
Relative Index: INVALID (ref 0.0–2.5)
Relative Index: INVALID (ref 0.0–2.5)
Total CK: 35 U/L (ref 7–177)
Total CK: 41 U/L (ref 7–177)
Troponin I: 0.01 ng/mL (ref 0.00–0.06)
Troponin I: <0.01 ng/mL (ref 0.00–0.06)

## 2010-09-20 LAB — POCT CARDIAC MARKERS
CKMB, poc: <1 ng/mL — ABNORMAL LOW (ref 1.0–8.0)
Myoglobin, poc: 36.5 ng/mL (ref 12–200)
Troponin i, poc: <0.05 ng/mL (ref 0.00–0.09)

## 2010-09-20 LAB — POCT I-STAT, CHEM 8
BUN: 9 mg/dL (ref 6–23)
Calcium, Ion: 1.2 mmol/L (ref 1.12–1.32)
Chloride: 104 mEq/L (ref 96–112)
Creatinine, Ser: 0.7 mg/dL (ref 0.4–1.2)
Glucose, Bld: 97 mg/dL (ref 70–99)
HCT: 43 % (ref 36.0–46.0)
Hemoglobin: 14.6 g/dL (ref 12.0–15.0)
Potassium: 3.7 mEq/L (ref 3.5–5.1)
Sodium: 140 mEq/L (ref 135–145)
TCO2: 27 mmol/L (ref 0–100)

## 2010-09-20 LAB — CBC
HCT: 35.3 % — ABNORMAL LOW (ref 36.0–46.0)
HCT: 36.8 % (ref 36.0–46.0)
Hemoglobin: 12.3 g/dL (ref 12.0–15.0)
Hemoglobin: 12.5 g/dL (ref 12.0–15.0)
MCH: 30.5 pg (ref 26.0–34.0)
MCH: 30.8 pg (ref 26.0–34.0)
MCHC: 34 g/dL (ref 30.0–36.0)
MCHC: 34.8 g/dL (ref 30.0–36.0)
MCV: 87.6 fL (ref 78.0–100.0)
MCV: 90.6 fL (ref 78.0–100.0)
Platelets: 246 10*3/uL (ref 150–400)
Platelets: 257 10*3/uL (ref 150–400)
RBC: 4.03 MIL/uL (ref 3.87–5.11)
RBC: 4.06 MIL/uL (ref 3.87–5.11)
RDW: 11.7 % (ref 11.5–15.5)
RDW: 11.9 % (ref 11.5–15.5)
WBC: 6.9 10*3/uL (ref 4.0–10.5)
WBC: 9.8 10*3/uL (ref 4.0–10.5)

## 2010-09-20 LAB — COMPREHENSIVE METABOLIC PANEL
ALT: 12 U/L (ref 0–35)
AST: 15 U/L (ref 0–37)
Albumin: 3.2 g/dL — ABNORMAL LOW (ref 3.5–5.2)
Alkaline Phosphatase: 54 U/L (ref 39–117)
BUN: 3 mg/dL — ABNORMAL LOW (ref 6–23)
CO2: 26 mEq/L (ref 19–32)
Calcium: 8.6 mg/dL (ref 8.4–10.5)
Chloride: 110 mEq/L (ref 96–112)
Creatinine, Ser: 0.59 mg/dL (ref 0.4–1.2)
GFR calc Af Amer: >60 mL/min (ref >60)
GFR calc non Af Amer: >60 mL/min (ref >60)
Glucose, Bld: 94 mg/dL (ref 70–99)
Potassium: 3.7 mEq/L (ref 3.5–5.1)
Sodium: 141 mEq/L (ref 135–145)
Total Bilirubin: 0.5 mg/dL (ref 0.3–1.2)
Total Protein: 5.7 g/dL — ABNORMAL LOW (ref 6.0–8.3)

## 2010-09-20 LAB — T4, FREE: Free T4: 1.06 ng/dL (ref 0.80–1.80)

## 2010-09-20 LAB — URINALYSIS, ROUTINE W REFLEX MICROSCOPIC
Bilirubin Urine: NEGATIVE
Glucose, UA: NEGATIVE mg/dL
Hgb urine dipstick: NEGATIVE
Ketones, ur: NEGATIVE mg/dL
Nitrite: NEGATIVE
Protein, ur: NEGATIVE mg/dL
Specific Gravity, Urine: 1.02 (ref 1.005–1.030)
Urobilinogen, UA: 0.2 mg/dL (ref 0.0–1.0)
pH: 6.5 (ref 5.0–8.0)

## 2010-09-20 LAB — DIFFERENTIAL
Basophils Absolute: 0 10*3/uL (ref 0.0–0.1)
Basophils Relative: 0 % (ref 0–1)
Eosinophils Absolute: 0.1 10*3/uL (ref 0.0–0.7)
Eosinophils Relative: 1 % (ref 0–5)
Lymphocytes Relative: 16 % (ref 12–46)
Lymphs Abs: 1.6 10*3/uL (ref 0.7–4.0)
Monocytes Absolute: 0.6 10*3/uL (ref 0.1–1.0)
Monocytes Relative: 6 % (ref 3–12)
Neutro Abs: 7.5 10*3/uL (ref 1.7–7.7)
Neutrophils Relative %: 77 % (ref 43–77)

## 2010-09-20 LAB — TSH: TSH: 5.222 u[IU]/mL — ABNORMAL HIGH (ref 0.350–4.500)

## 2010-09-20 LAB — T3, FREE: T3, Free: 2.5 pg/mL (ref 2.3–4.2)

## 2010-09-20 LAB — POCT PREGNANCY, URINE: Preg Test, Ur: NEGATIVE

## 2010-10-02 ENCOUNTER — Ambulatory Visit: Payer: Self-pay | Admitting: Adult Health

## 2010-10-04 ENCOUNTER — Ambulatory Visit: Payer: Self-pay | Admitting: Adult Health

## 2010-10-08 ENCOUNTER — Encounter: Payer: Self-pay | Admitting: Pulmonary Disease

## 2010-10-10 ENCOUNTER — Ambulatory Visit: Payer: Self-pay | Admitting: Adult Health

## 2010-10-17 ENCOUNTER — Telehealth: Payer: Self-pay | Admitting: *Deleted

## 2010-10-17 ENCOUNTER — Telehealth: Payer: Self-pay | Admitting: Pulmonary Disease

## 2010-10-17 NOTE — Telephone Encounter (Signed)
Symbicort APPROVED from 10/17/2010 to 10/17/2011 through [facility] at [phone]. Pt ID # [id number]. Pt is aware.

## 2010-10-17 NOTE — Telephone Encounter (Signed)
Called [facility] and asked that they refax PA request for pt's Symbicort. Pt aware we have left samples for her to pick up while awaiting PA approval or denial.

## 2010-10-24 ENCOUNTER — Ambulatory Visit (INDEPENDENT_AMBULATORY_CARE_PROVIDER_SITE_OTHER): Payer: BC Managed Care – PPO | Admitting: Adult Health

## 2010-10-24 ENCOUNTER — Encounter: Payer: Self-pay | Admitting: Adult Health

## 2010-10-24 DIAGNOSIS — J45909 Unspecified asthma, uncomplicated: Secondary | ICD-10-CM

## 2010-10-24 DIAGNOSIS — J309 Allergic rhinitis, unspecified: Secondary | ICD-10-CM

## 2010-10-24 NOTE — Assessment & Plan Note (Signed)
Well controlled on meds.  Continue on current regimen.  Have tried in past to decrease dose of symbicort however pt declines Says she does much better on 160/4.53mcg dose.

## 2010-10-24 NOTE — Progress Notes (Signed)
Subjective:    Patient ID: [name] [name], female    DOB: Sep 30, 1965, 45 y.o.   MRN: [id number]  HPI 45 yo WF, with known history of Asthma since childhood, worse during & after pregnancy 2001.  Triggers - GERD vs allergies  Hx of severe reflux for many yrs, improved after Nissen's 2 yr ago   02/08/08-- Pt woke up choking in middle of night, since then throat feels raw, has had dry cough, and felt wheezy >> steroids  9/1>> much improved, but c/o chest tightness, spirometry nml, FEv1 99%  Breakthrough reflux 4-5/wk, takes tums every night with ranitidine; reglan did not work (dysmotility noted on studies by Dr. Daphine Deutscher post Nissen's in 2008).  2 exacerbations in 2010 requiring steroids.   Nov 06, 2009 9:23 AM  [facility] has stopped zoloft/wellbutrin and has lost 30lbs. GERD under good control - off omeprazole, on tums twice/ month.   July 04, 2010 9:34 AM  c/o URI 3 wks ago, now cough & wheezing  c/o chest tightness, unable to take deep breaths, dry cough, chest congestion x 3 weeks, PND, neck tender to touch x 2 days. Denies fever, n/v/d, and sweats. Is using Symbicort 1 puff twice daily.    10/24/2010 Follow Up Pt returns for 4 month follow up. Says she has been doing very well. Uses symbicort everyday- no missed doses. Rare uses of Albuterol.  Has had some itchy eyes, nasal drip and PND. Using zyrtec daily. No discolored mucus. No fever.  Activity tolerance is good. No wheezing.     Review of Systems Constitutional:   No  weight loss, night sweats,  Fevers, chills, fatigue, or  lassitude.  HEENT:   No headaches,  Difficulty swallowing,  Tooth/dental problems, or  Sore throat,                + sneezing, itching,  post nasal drip,   CV:  No chest pain,  Orthopnea, PND, swelling in lower extremities, anasarca, dizziness, palpitations, syncope.   GI  No heartburn, indigestion, abdominal pain, nausea, vomiting, diarrhea, change in bowel habits, loss of appetite, bloody stools.   Resp:  No shortness of breath with exertion or at rest.  No excess mucus, no productive cough,  No non-productive cough,  No coughing up of blood.  No change in color of mucus.  No wheezing.  No chest wall deformity  Skin: no rash or lesions.  GU: no dysuria, change in color of urine, no urgency or frequency.  No flank pain, no hematuria   MS:  No joint pain or swelling.  No decreased range of motion.  No back pain.  Psych:  No change in mood or affect. No depression or anxiety.  No memory loss.         Objective:   Physical Exam GEN: A/Ox3; pleasant , NAD, well nourished   HEENT:  [place]/AT,  EACs-clear, TMs-wnl, NOSE-clear drainage , THROAT-clear, no lesions, no postnasal drip or exudate noted.   NECK:  Supple w/ fair ROM; no JVD; normal carotid impulses w/o bruits; no thyromegaly or nodules palpated; no lymphadenopathy.  RESP  Clear  P & A; w/o, wheezes/ rales/ or rhonchi.no accessory muscle use, no dullness to percussion  CARD:  RRR, no m/r/g  , no peripheral edema, pulses intact, no cyanosis or clubbing.  GI:   Soft & nt; nml bowel sounds; no organomegaly or masses detected.  Musco: Warm bil, no deformities or joint swelling noted.   Neuro: alert, no focal deficits  noted.    Skin: Warm, no lesions or rashes         Assessment & Plan:

## 2010-10-24 NOTE — Assessment & Plan Note (Signed)
Mild flare with High pollen counts  Plan Saline  Nasal rinse Zyrtec daily  Add pataday eye drops daily short term. Sample given.

## 2010-10-24 NOTE — Patient Instructions (Signed)
Continue on Symbicort 2 puffs Twice daily   May use Pataday eye drop 1 drop each eye daily -sample given call back if you need prescription Saline nasal rinses As needed

## 2010-11-20 NOTE — Op Note (Signed)
NAMEKHAMIYAH, [name] [name]              ACCOUNT NO.:  [id number]   MEDICAL RECORD NO.:  [id number]          PATIENT TYPE:  AMB   LOCATION:  DAY                          FACILITY:  [facility]   PHYSICIAN:  Thornton Park. Daphine Deutscher, MD  DATE OF BIRTH:  10-Jun-1966   DATE OF PROCEDURE:  07/16/2007  DATE OF DISCHARGE:                               OPERATIVE REPORT   PREOPERATIVE DIAGNOSIS:  Upper midline trocar hernia, incarcerated with  fat.   POSTOPERATIVE DIAGNOSIS:  Upper midline trocar hernia with incarcerated  fat and lower midline Pfannenstiel incisional hernia from C section 5  years ago containing incarcerated omentum.   PROCEDURE:  Laparoscopic repair of lower ventral hernia with PROCEED  mesh 12 x 15 cm, primary closure after reduction of the incarcerated  trocar site hernia in the upper midline with four sutures of zero  Prolene.   SURGEON:  Thornton Park. Daphine Deutscher, MD   ASSISTANT:  Anselm Pancoast. Zachery Dakins, M.D.   ANESTHESIA:  General endotracheal.   SPECIMENS:  None.   ESTIMATED BLOOD LOSS:  Minimal.   DESCRIPTION OF PROCEDURE:  [name] [name] is a 45 year old lady on whom  I did a laparoscopic Nissen fundoplication on August 28, 2005.  This  was done over 50-French dilator and she had a two suture pledgeted  closure of the hiatal hernia.  She has had some problems with motility  and she has underlying asthma with coughing and developed some pain in  the upper midline and was found a small trocar hernia.  We followed this  but the pain has been getting worse.  She has been unable to lose weight  which we tried to do and she comes at this time for laparoscopic repair.  After prepping her with Techni-Care, I made a small transverse incision  through her previous left upper quadrant incision and used a 0 degrees  OptiVu trocar to enter the abdomen and insufflated.  In the upper  midline, where the falciform ligament was, is where the hernia into the  trocar was and using a harmonic scalpel I  took this away and reduced the  hernia, realizing she had a trocar sized fascial defect.   More importantly was in the lower midline she had diastasis in a pocket  in her right lower quadrant and hernia site.  I went ahead and placed  another 5 mm on the left side and another on the right and used those.  I went ahead and removed omentum that was incarcerated up in this lower  midline hernia.  I used a piece of PROCEED mesh that was 12 X 15, rolled  and placed through a 10 trocar which we inserted through the other upper  trocar hernia site.  This gave me a 10 mm in the midline going through  that hernia sac, two 5s on the left and one on the right.  This PROCEED  had Prolene sutures placed along the axis of the ellipse.  These were 0  Prolene.  It was inserted and rolled out.  I used an Endoclose to reach  in and grab the Prolene.  I  pulled these out and tied them and the  remainder portion of the hernia was repaired with a tacker, tacking this  nicely to the peritoneum.   Everything else appeared to be in order.  I injected the sites with  Marcaine.  The upper midline hernia was repaired under direct vision  after excising the sac with four sutures of zero Prolene.  I examined  this from the inside with a laparoscope after doing this and it was a  good repair.  Wounds were then closed with 4-0 Vicryl and with  Dermabond.  The patient was given Tylox for pain and will be followed up  the office in 3 weeks.      Thornton Park Daphine Deutscher, MD  Electronically Signed     MBM/MEDQ  D:  07/16/2007  T:  07/16/2007  Job:  [id number]   cc:   Leanne Chang, M.D.  Fax: [phone]   Everardo Beals. Juanda Chance, MD, [facility]  [address]  [place], [place] [address]

## 2010-11-20 NOTE — Assessment & Plan Note (Signed)
[facility]                             PULMONARY OFFICE NOTE   NAME:[name] [name], [name] [name]                     MRN:          [id number]  DATE:12/16/2006                            DOB:          1966-02-29    HISTORY OF PRESENT ILLNESS:  The patient is a 45 year old white female  patient of Dr. Georgann Housekeeper who has a known history of mild persistent  asthma presents for 3 month followup.  The patient was recently changed  over from Advair over to Symbicort for suspected upper airway  irritation.  The patient reports that she does not care for the taste of  Symbicort; however,  has not had any flare in symptoms and has rare  rescue use.  The patient denies any wheezing, chest pain, shortness of  breath, nocturnal awakenings, or change in activity tolerance.   PAST MEDICAL HISTORY:  Reviewed.   CURRENT MEDICATIONS:  Reviewed.   PHYSICAL EXAMINATION:  The patient is a pleasant female in no acute  distress.  She is afebrile with stable vital signs.  O2 saturation is 97% on room  air.  HEENT:  Is unremarkable.  Neck is supple without adenopathy.  No JVD.  Lung sounds are clear to auscultation bilaterally.  Cardiac regular rate.  Abdomen is soft and nontender.  Extremities are warm without any edema.   IMPRESSION/PLAN:  Mild persistent asthma, currently well controlled on  Symbicort. We will decrease down to 80/4.5 two puffs twice daily.  The  patient will recheck here in 3 to 6 months with Dr. Shelle Iron or sooner if  needed.      Rubye Oaks, NP  Electronically Signed      Gailen Shelter, MD  Electronically Signed   TP/MedQ  DD: 12/17/2006  DT: 12/17/2006  Job #: [id number]

## 2010-11-20 NOTE — Assessment & Plan Note (Signed)
[name]                        [facility] [facility] OFFICE NOTE   [name] [name], [name] [name]                     MRN:          [id number]  DATE:12/26/2006                            DOB:          06-02-1966    INCOMPLETE DICTATION     Leanne Chang, M.D.     LA/MedQ  DD: 12/26/2006  DT: 12/26/2006  Job #: [id number]

## 2010-11-23 NOTE — Op Note (Signed)
NAME:  [name] [name], [name] [name]              ACCOUNT NO.:  [id number]   MEDICAL RECORD NO.:  [id number]          PATIENT TYPE:  AMB   LOCATION:  ENDO                         FACILITY:  [facility]   PHYSICIAN:  Anselmo Rod, M.D.  DATE OF BIRTH:  08-12-1965   DATE OF PROCEDURE:  07/11/2004  DATE OF DISCHARGE:                                 OPERATIVE REPORT   PROCEDURE:  Esophagogastroduodenoscopy with antral biopsies.   ENDOSCOPIST:  Charna Elizabeth, M.D.   INSTRUMENT USED:  Olympus video panendoscope.   INDICATIONS FOR PROCEDURE:  Epigastric pain with nausea in a 45 year old  white female.  Rule out peptic ulcer disease, esophagitis, cancer, etc.   PREPROCEDURE PREPARATION:  Informed consent was procured from the patient.  The patient was fasted for eight hours prior to the procedure.   PREPROCEDURE PHYSICAL:  VITAL SIGNS:  The patient had stable vital signs.  NECK:  Supple.  CHEST:  Clear to auscultation.  CARDIAC:  S1 and S2 regular.  ABDOMEN:  Soft with normal bowel sounds.   DESCRIPTION OF PROCEDURE:  The patient was placed in the left lateral  decubitus position and sedated with 60 mg of Demerol and 8 mg of Versed  intravenously in incremental doses.  Once the patient was adequately  sedated, maintained on low-flow oxygen and continuous cardiac monitoring,  the Olympus video panendoscope was advanced through the mouth piece over the  tongue, into the esophagus under direct vision.  The entire esophagus  appeared normal with no evidence of rings, stricture, masses, esophagitis or  Barrett's mucosa.  The scope was then advanced into the stomach. There was  diffuse gastritis noted throughout the gastric mucosa.  Biopsies were done  from the antrum to rule out presence of  H pylori by pathology.  The  proximal small bowel appeared normal.  Retroflexion in the high cardia  revealed no abnormalities.  No masses or polyps were seen.   IMPRESSION:  1.  Normal appearing esophagus and proximal  small bowel.  2.  Diffuse gastritis, biopsies done for H pylori.   RECOMMENDATIONS:  1.  Treat with antibiotics if H pylori positive.  2.  Refer to a surgeon for biliary dyskinesia.  3.  The patient was found to have very low EF on the HIDA scan done today.      Jyot   JNM/MEDQ  D:  07/11/2004  T:  07/11/2004  Job:  [id number]   cc:   Leanne Chang, M.D.  [address]  [place]  [place] [address]  Fax: [phone]   Cherylynn Ridges III, M.D.  [address], [address]  [place]  [place] [address]  Fax: [phone]  Email: [email or web address]

## 2010-11-23 NOTE — Consult Note (Signed)
NAMEJEROLENE, [name] [name]              ACCOUNT NO.:  [id number]   MEDICAL RECORD NO.:  [id number]           PATIENT TYPE:   LOCATION:                                 FACILITY:   PHYSICIAN:  Lina Sar, M.D. [facility]  DATE OF BIRTH:  08-Nov-1965   DATE OF CONSULTATION:  DATE OF DISCHARGE:                                   CONSULTATION   PROCEDURE:  Esophageal manometry.   INDICATIONS:  This 45 year old white female with severe gastroesophageal  reflux is being evaluated for Nissen fundoplication.   RESULTS:  Manometric catheter placed through the naris and passed through  the esophagus into the stomach.  The patient has continued her Protonix on  the day of the procedure.   Lower esophageal sphincter is located at 34 cm from the nares, distal end at  42 cm due to the hiatal hernia.  Total LES length was 8 cm.  Intra-abdominal  LES was 5.5 cm due to hiatal hernia.  Lower esophageal sphincter was 18.5  mm, which is a normal pressure.  Normal LES is between 10-45 mmHg.  With  seven swallows, LES pressure relaxed 96%, which is normal.  Peristaltic  waves were measured throughout the proximal, mid and distal esophagus.  Out  of 10 swallows, all 10 contractions were propulsive.  The amplitude and  duration was normal with the exception of one isolated elevated amplitude in  proximal esophagus.  There were no repetitive or retrograde contractions.   Upper esophageal sphincter was normal and relaxed with swallowing to the  baseline.   IMPRESSION:  Essentially normal upper esophageal manometry with normal lower  esophageal sphincter pressure with relaxation as well as normal propulsive  peristalsis.   CONCLUSION:  There is no contraindication to a Nissen fundoplication.      Lina Sar, M.D. [facility]  Electronically Signed     DB/MEDQ  D:  07/18/2005  T:  07/19/2005  Job:  [id number]   cc:   Thornton Park Daphine Deutscher, MD  [address]  [place]  [place] [address]   Danice Goltz, M.D.  [facility]  [address] [place], [place] [address]

## 2010-11-23 NOTE — Assessment & Plan Note (Signed)
[facility]                             PULMONARY OFFICE NOTE   NAME:[name] [name]                     MRN:          [id number]  DATE:08/26/2006                            DOB:          [birth year]    HISTORY OF PRESENT ILLNESS:  The patient is a 45 year old white female  patient of Dr. Jayme Cloud, who has a known history of asthma, returns  today for persistent cough, congestion and wheezing.  The patient was  seen 5 days ago for an acute upper respiratory infection and recommended  to use Mucinex DM twice daily along with Zyrtec.  The patient reports  symptoms have not improved and she continues to have a productive cough  with thick yellow sputum and wheezing.  She denies any hemoptysis, chest  pain, orthopnea, PND or leg swelling.   PAST MEDICAL HISTORY:  Reviewed.   CURRENT MEDICATIONS:  Reviewed.   PHYSICAL EXAMINATION:  The patient is a pleasant female in no acute  distress.  She is afebrile with stable vital signs.  O2 saturation is 98% on room  air.  HEENT:  Unremarkable.  NECK:  Supple without adenopathy.  No JVD.  LUNGS:  Lung sounds reveal a few expiratory wheezes.  CARDIAC:  Regular rate and rhythm.  ABDOMEN:  Soft and nontender.  EXTREMITIES:  Warm without any edema.   IMPRESSION AND PLAN:  Acute upper respiratory infection with some mild  asthmatic flare.  The patient is to begin Z-Pak.  Mucinex DM twice  daily.  Prednisone taper over the next week.  The patient is given  Xopenex nebulizer treatment in the office.  The patient is to return  back with Dr. Jayme Cloud as scheduled or sooner if needed.      Rubye Oaks, NP  Electronically Signed      Gailen Shelter, MD  Electronically Signed   TP/MedQ  DD: 08/26/2006  DT: 08/27/2006  Job #: [id number]

## 2010-11-23 NOTE — Op Note (Signed)
[name] [name], [name] [name]              ACCOUNT NO.:  [id number]   MEDICAL RECORD NO.:  [id number]          PATIENT TYPE:  AMB   LOCATION:  DSC                          FACILITY:  [facility]   PHYSICIAN:  Cindee Salt, M.D.       DATE OF BIRTH:  Mar 02, 1966   DATE OF PROCEDURE:  07/16/2005  DATE OF DISCHARGE:                                 OPERATIVE REPORT   PREOPERATIVE DIAGNOSIS:  Avulsion of skin with portion of nail bed, left  thumb.   POSTOPERATIVE DIAGNOSIS:  Avulsion of skin with portion of nail bed, left  thumb.   OPERATION:  Repair avulsion of skin, nail bed repair left thumb.   SURGEON:  Kuzma   ASSISTANT:  Inman R.N.   ANESTHESIA:  Metacarpal block.   HISTORY:  The patient is a 45 year old female who suffered a laceration to  the radial aspect of her left thumb removing a portion of skin.  Bone is not  involved and nail bed is involved.  The pieces heading by epidermis only.   PROCEDURE:  The patient is brought to the operating room and metacarpal  block given with 1% Xylocaine without epinephrine, prepped using Betadine  solution. A Penrose drain was used for tourniquet control after adequate  anesthesia was afforded.  4 mL of 1% Xylocaine was used. The nail plate was  removed from the flap. This was then defatted.  The wound irrigated. The  repair was then performed with a running 5-0 chromic suture, realigning the  nail fold, nail matrix and the entire flap. This was sutured into position.  A stent dressing was placed on a nonadherent gauze, Steri-Stripped in place.  A sterile compressive dressing and splint was applied. The patient tolerated  the procedure well. The nail plate avulsed portion was reapproximated to the  proximal nail fold following the repair. The patient tolerated the procedure  well. She is discharged home to return to the [facility]  [facility] [place] in  one week on Vicodin and Keflex.           ______________________________  Cindee Salt, M.D.     GK/MEDQ  D:  07/16/2005  T:  07/17/2005  Job:  [id number]

## 2010-11-23 NOTE — Discharge Summary (Signed)
[facility]  Patient:    [name] [name], [name] [name]                     MRN: [id number] Adm. Date:  [id number] Disc. Date: [id number] Attending:  Esmeralda Arthur                           Discharge Summary  The patient was admitted for preeclampsia at 37 weeks on October 22, 1999, and was artificially ruptured and induced.  During the process of her labor, she had worsening of eclampsia and a poorly progressing labor pattern.  Therefore, she was C-sectioned due to worsening preeclampsia with pulmonary edema.  She had a primary LSTCS on October 22, 1999, without complications.  On postoperative day #1, patients laboratory values were stable, however, her pulmonary edema persisted and she had evolution of an unexplained tachycardia which was not abated with diuresis and good urine output.  Therefore, consultation was made with cardiology.  Recommendations were for an EKG and a thyroid panel, all of which were within normal limits.  The patient had an echocardiogram subsequently which was also within normal limits.  She was placed on Xopenex, Nasacort AQ and Claritin.  She was discharged to home on day #4.  She was given Claritin, Tylox and Motrin.  She was to follow up in the office in one week.  Cardiology follow up was scheduled.  Discharge teaching was done. DD:  12/30/99 TD:  12/31/99 Job: [id number]

## 2010-11-23 NOTE — Op Note (Signed)
NAME:  [name] [name], [name] [name]                        ACCOUNT NO.:  [id number]   MEDICAL RECORD NO.:  [id number]                   PATIENT TYPE:  INP   LOCATION:  9108                                 FACILITY:  [facility]   PHYSICIAN:  Maxie Better, M.D.            DATE OF BIRTH:  July 28, 1965   DATE OF PROCEDURE:  05/24/2002  DATE OF DISCHARGE:                                 OPERATIVE REPORT   PREOPERATIVE DIAGNOSES:  1. Preterm premature rupture of membranes.  2. Breech presentation.  3. Intrauterine gestation at 34-2/7 weeks.  4. Desires sterilization.   POSTOPERATIVE DIAGNOSES:  1. Complete breech presentation.  2. Preterm premature rupture of membranes.  3. Intrauterine gestation at 34-2/7 weeks.  4. Desires sterilization.   PROCEDURES:  1. Repeat cesarean section.  2. Modified Pomeroy tubal ligation.   ANESTHESIA:  Epidural.   SURGEON:  Maxie Better, M.D.   ASSISTANT:  Genia Del, M.D.   SPECIMENS:  Placenta, sent to pathology, as well as a portion of the right  and left fallopian tube.   ESTIMATED BLOOD LOSS:  750 cc.   URINE OUTPUT:  250 cc clear yellow urine.   FLUIDS REPLACED:  1400 cc crystalloid.   COMPLICATIONS:  None.   INDICATIONS:  This is a 45 year old gravida 2, para 1, female at 34-2/7  weeks' gestation, who presented with preterm premature rupture of membranes  on 05/23/02 and who was found to not only have a breech presentation but  mature fetal lung indices.  The patient had a previous cesarean section and  had desired a repeat C-section as well as sterilization.  After a long  discussion in light of the patient's premature state, the patient would like  to proceed with a permanent sterilization in addition to her repeat cesarean  section at this point.  Group B strep cultures unknown, as the patient has  been on antibiotic prophylaxis.  Risks and benefits of the procedure have  been explained to the patient and her husband.  Consent  was signed.  Anesthesia notified, as the patient is on multiple medications for asthma.   DESCRIPTION OF PROCEDURE:  Under adequate epidural anesthesia, the patient  was placed in a supine position with a left lateral tilt.  She was  thoroughly prepped and draped in the usual fashion.  An indwelling Foley  catheter was sterilely placed.  Marcaine 0.25% was injected along the  previous Pfannenstiel skin incision.  The Pfannenstiel skin incision was  then made, carried down to the rectus fascia using Bovie cautery and  scalpel.  The rectus fascia was incised in the midline, extended  bilaterally, the rectus fascia then bluntly and sharply dissected off the  rectus muscle in an inferior and superior fashion.  At the superior level  the parietal peritoneum was entered at the same time and a buttonhole  opening was noted in the anterior aspect of the fascia from the dissection.  The  parietal peritoneum was further opened.  The omentum, which was  proximally adherent to the right anterior abdominal wall, was lysed.  The  vesicouterine peritoneum was opened.  A lot of dilated vessels were noted,  right greater than left, in the lower uterine segment.  A curvilinear low  transverse uterine incision was then made and extended bilaterally using  bandage scissors.  Subsequent delivery of a live female from a complete breech  presentation was accomplished with a cord around the left arm.  The baby was  bulb-suctioned on the abdomen, clamped cord, and transferred to the waiting  pediatricians, who assigned Apgars of 8 and 9.  The placenta was  spontaneous, appeared intact; however, a piece of placental tissue was  subsequently removed from the cavity.  Uterine cavity was cleaned of debris,  no intrauterine abnormalities noted.  The uterine incision was closed in two  layers, the first layer with 0 Monocryl running locked stitch.  A second  layer was imbricated using 0 Monocryl suture.  Small bleeders  along the  peritoneal edges were cauterized.  The incision was reinforced on the right  side from episode of bleeding with a figure-of-eight suture.  Attention was  then turned to the tubes, where the left fallopian tube was brought into the  field.  A Tanja Port was used to hold the midportion.  The underlying  mesosalpinx was opened with a cautery, and the proximal and distal portion  of the fallopian tube was tied with 0 chromic sutures x2 proximally and  distally, and the intervening segment of fallopian tube was then removed,  the edges cauterized.  Normal tubes, normal ovaries were noted.  The same  procedure was performed on the contralateral side after identified the  fimbriated end as well.  The uterus was not exteriorized.  The abdomen was  copiously irrigated and suctioned of debris.  Bleeding along the lower  uterine segment just slightly above the bladder was noted and initial  cauterization was performed; however, continued bleeding where there was a  bevy of blood vessels required multiple 2-0 Vicryl sutures, with subsequent  hemostasis noted, at which time the paracolic gutters were cleaned of  debris.  The vesicouterine peritoneum was not closed.  The parietal  peritoneum was not closed.  The buttonhole area in the fascia was closed  with figure-of-eight 0 Vicryl sutures and the fascia was then subsequently  closed with 0 Vicryl x2.  The skin was approximated using Ethicon staples  after the subcutaneous area was irrigated, suctioned, and small bleeders  cauterized.  Sponge and instrument counts x2 were correct.  Weight of the  baby was 5 pounds 7 ounces.  The baby was transferred to the newborn  nursery.  The patient tolerated the procedure well, was transferred to the  recovery room in stable condition.                                                Maxie Better, M.D.    [place]/MEDQ  D:  05/24/2002  T:  05/25/2002  Job:  [id number]

## 2010-11-23 NOTE — Assessment & Plan Note (Signed)
[facility]                             PULMONARY OFFICE NOTE   NAME:[name] [name], [name] [name]                     MRN:          [id number]  DATE:09/01/2006                            DOB:          [birth year]    This is a 45 year old white female with mild persistent asthma who  presents for followup after a recent exacerbation due to acute  tracheobronchitis.  She was treated by our nurse practitioner, Rubye Oaks, and is currently finishing a Z-Pak and a prednisone taper.  The  patient still has difficulties with chest tightness and cough; however,  secretions are less tenacious then they were prior.  She also notices  some improvement overall, but slow to improve.   The patient denies any fevers, chills, or sweats.   CURRENT MEDICATIONS:  As noted on the intake sheet.  These have been  reviewed and are accurate.   PHYSICAL EXAMINATION:  VITAL SIGNS:  As noted.  Blood pressure 138/52.  Pulse 93.  Temperature 97.8.  Oxygen saturation 95% on room air.  GENERAL:  This is an obese female who is in no acute distress.  HEENT:  Examination was unremarkable.  Pharynx is clear.  NECK:  Supple.  No adenopathy noted.  No JVD.  LUNGS:  She has rhonchi throughout.  Some occasional end-expiratory  wheeze.  CARDIAC EXAMINATION:  Regular rate and rhythm.  No rubs, murmurs, or  gallops heard.  ABDOMEN:  Obese, otherwise unremarkable.   IMPRESSION:  Asthma with recent exacerbation due to tracheobronchitis,  persistent exacerbation.   PLAN:  1. The plan will be to discontinue her Advair.  2. We will switch the patient to Symbicort 160/4.5, 2 inhalations      twice a day.  3. She is to finish her current dose of prednisone.  4. Resume use of Mucinex 2 tablets twice a day, this is Mucinex plain.  5. She is to see our nurse practitioner, Tammy Parrett, in two weeks.      She is to contact us prior to that time should any new problems      arise.  She was instructed  on use of metered-dose inhaler.     Gailen Shelter, MD  Electronically Signed    CLG/MedQ  DD: 09/01/2006  DT: 09/01/2006  Job #: [id number]

## 2010-11-23 NOTE — H&P (Signed)
NAME:  [name] [name], [name] [name]                        ACCOUNT NO.:  [id number]   MEDICAL RECORD NO.:  [id number]                   PATIENT TYPE:  INP   LOCATION:  9154                                 FACILITY:  [facility]   PHYSICIAN:  Lenoard Aden, M.D.             DATE OF BIRTH:  [birth year]   DATE OF ADMISSION:  05/23/2002  DATE OF DISCHARGE:                                HISTORY & PHYSICAL   CHIEF COMPLAINT:  Spontaneous rupture of membranes.   HISTORY OF PRESENT ILLNESS:  The patient is a 45 year old white female G52,  P68, EDD July 03, 2002, at 34-1/7 weeks who presents with spontaneous  rupture of membranes at approximately 9 p.m. this evening.   ALLERGIES:  The patient had allergies to PENICILLIN AND SULFA DRUGS and has  previously taken cephalosporins.   MEDICATIONS:  Prenatal vitamins, Zithromax, Singulair, Pulmicort, albuterol,  Ranitidine and Prevacid.   PAST OBSTETRIC HISTORY:  This is remarkable for an SAB in 1999 x2 first  trimester, history of primary C-section in April 2001 for severe  preeclampsia at term.   PAST MEDICAL HISTORY:  No other past medical or surgical hospitalizations  except for a tonsillectomy and GI related abdominal discomfort. She also has  a history of asthma and mitral valve prolapse.   FAMILY HISTORY:  This is remarkable for myocardial infarction, chronic  hypertension, gastroesophageal problems, rheumatoid arthritis, breast cancer  and alcohol abuse.   LABORATORY DATA:  Prenatal lab data reveals a blood type A+, Rh antibody  negative, rubella immune, hepatitis B surface antigen negative, HIV  nonreactive.  GC and Chlamydia negative.   Pregnancy course has been uncomplicated to date except for upper respiratory  infection treated now with Zithromax and has known complicated reflux  symptoms as previously noted throughout the pregnancy.   PHYSICAL EXAMINATION:  GENERAL APPEARANCE:  She is an anxious-appearing  white female.  VITAL SIGNS:   Blood pressure 118 to 132/89 to 90, pulse 92, temperature  97.7.  HEENT:  Normal.  LUNGS:  Clear.  ABDOMEN:  Soft, gravid, nontender.  Ultrasound performed reveals a frank  breech presentation with fetal vertex in the maternal left upper quadrant.  PELVIC:  Sterile speculum examination is performed.  Visually the cervix is  closed and long.  Clear amniotic fluid is noted and approximately 10 cc  collected for Amniostat testing.  EXTREMITIES:  DTRs 2+.  No pretibial edema.  NEUROLOGIC:  Nonfocal.   IMPRESSION:  1. A 34-1/7 week intrauterine pregnancy.  2. Preterm spontaneous rupture of membranes.  3. Previous history of severe preeclampsia, no evidence of preeclamptic     stigmata at this time.  4. History of asthma.  5. History of complicated gastroesophageal reflux.  6. Breech presentation.   PLAN:  Admit, check Amniostat for a PT status.  Will proceed with repeat C-  section for active labor which is discussed with the patient.  Will not  perform  any tocolytics at this time. Will treat with Ancef 2 g IV, then 1 g  IV q.8h. until group B strep status has returned.  Will schedule primary C-  section in the absence of active labor pending the outcome of her Amniostat  testing.  Will check CBC and PIH labs at this time and repeat CBC in 12 to  24 hours. Will also proceed with delivery for any signs of infection.                                               Lenoard Aden, M.D.    RJT/MEDQ  D:  05/23/2002  T:  05/24/2002  Job:  [id number]   cc:   [facility]

## 2010-11-23 NOTE — Op Note (Signed)
[name] [name], [name] [name]              ACCOUNT NO.:  [id number]   MEDICAL RECORD NO.:  [id number]          PATIENT TYPE:  OIB   LOCATION:  2899                         FACILITY:  [facility]   PHYSICIAN:  Cherylynn Ridges III, M.D.DATE OF BIRTH:  25-Jan-1966   DATE OF PROCEDURE:  07/26/2004  DATE OF DISCHARGE:                                 OPERATIVE REPORT   PREOPERATIVE DIAGNOSES:  Symptomatic dysfunctional gallbladder.   POSTOPERATIVE DIAGNOSES:  Symptomatic dysfunctional gallbladder.   OPERATION PERFORMED:  Laparoscopic cholecystectomy with cholangiogram.   SURGEON:  Marta Lamas. Lindie Spruce, M.D.   ASSISTANT:  Gabrielle Dare. Janee Morn, M.D.   ANESTHESIA:  General endotracheal.   ESTIMATED BLOOD LOSS:  Less than 20 to 30 mL.   COMPLICATIONS:  None.   CONDITION:  Stable.   INDICATIONS FOR PROCEDURE:  The patient is a 45 year old female with an  ejection fraction of 5% and pain in the right upper quadrant epigastrium who  now comes for laparoscopic cholecystectomy.   OPERATIVE FINDINGS:  The patient had a thin-walled gallbladder with some  evidence of chronic inflammation. No stones were noted.  The cholangiogram  showed good flow into the common bile duct, proximal flow, no evidence of  obstruction or intraductal stones.   DESCRIPTION OF PROCEDURE:  The patient was taken to the operating room and  placed on the table in the supine position.  After an adequate endotracheal  anesthetic was administered, the patient was prepped and draped in the usual  sterile manner exposing the midline and the right upper quadrant.  A  supraumbilical curvilinear incision was made using a #11 blade and taken  down to the midline fascia.  Through this midline fascia, a Veress needle  was passed into the peritoneal cavity.  It was confirmed to be in position  using the saline test; however, it was difficult getting consistent carbon  dioxide flow.  We dissected out this area again more so with hemostat clamps  and as  we were doing so, we penetrated the peritoneum with the hemostat  clamps.  We subsequently passed an OptiView cannula through this opening in  the fascia under direct vision with minimal difficulty, then insufflated up  to a maximum intra-abdominal pressure of 15 mmHg.  A towel clamp was needed  to hold the skin together to avoid more carbon dioxide leakage onto the  skin.  We subsequently passed two right costal margin 5 mm cannulas in the  subxiphoid and a 12 mm cannula under direct vision.  The patient was placed  in reverse Trendelenburg, the left side was tilted down an the dissection  begun.   The gallbladder was easily to grasp and was retracted towards the right  upper quadrant through the lateral most 5 mm cannula.  A suck and grasp was  passed onto the infundibulum.  Then we dissected out the peritoneum  overlying the hepatoduodenal triangle.  It also was overlying the triangle  of Calot and the hepatoduodenal area.  The cystic duct and the cystic artery  were dissected free.  We subsequently clipped the cystic duct along the  gallbladder side,  made a cholecystodochotomy, then performed a cholangiogram  through a Cook catheter which had been passed through the anterior abdominal  wall.  This demonstrated good flow into the duodenum, proximal flow and no  intraductal stones and no evidence of obstruction.   We removed the [facility] catheter, insufflated the abdomen again, removed the  [facility] catheter, then endoclipped the distal duct triply.  We then transected  the cystic duct, isolated the cystic artery more superior to the cystic  duct, endoclipped that proximally and distally, then dissected out the  gallbladder from its bed with  minimal difficulty.  The gallbladder bed was  cauterized for hemostasis.  We irrigated with saline.  Because there was no  perforation of the gallbladder during dissection, we were able to bring it  out through the supraumbilical site without an EndoCatch  bag.  There was  minimal difficulty with this.  The gallbladder was taken off the field.  We  irrigated with about 900 to 1000 mL of saline and once this was done, we  removed all gas from the abdomen using the suction device.  All counts were  correct.  We closed the supraumbilical fascia using a figure-of-eight  sutures stitch of 0 Vicryl.  The skin was closed using running subcuticular  stitch of 4-0 Vicryl.  Each area was injected with 0.25% Marcaine with  epinephrine. All counts were correct.      Jame   JOW/MEDQ  D:  07/26/2004  T:  07/26/2004  Job:  [id number]

## 2010-11-23 NOTE — Op Note (Signed)
[facility]  Patient:    [name] [name], [name] [name]                     MRN: [id number] Proc. Date: 10/22/99 Adm. Date:  [id number] Attending:  Silverio Lay A                           Operative Report  PREOPERATIVE DIAGNOSIS:       37+ weeks, worsening preeclampsia, with pulmonary  edema.  POSTOPERATIVE DIAGNOSIS:      37+ weeks, worsening preeclampsia, with pulmonary  edema.  OPERATION:                    Primary low transverse cesarean section.  SURGEON:                      Lenoard Aden, M.D.  ASSISTANT:                    Hayes Ludwig, F.N.P.  ANESTHESIA:                   Combined spinal epidural by J. Leilani Able,  Montez Hageman., M.D.  ESTIMATED BLOOD LOSS:         1000 cc.  COMPLICATIONS:                None.  DRAINS:                       None.  CONDITION:                    The patient to the recovery room in good condition.  FINDINGS:                     A fullterm living female, Apgars 8 and 9. Placenta to pathology.  No complications.  DESCRIPTION OF PROCEDURE:     After being apprised of the risks of anesthesia, infection, bleeding, injury to abdominal organs with need for repair, the patient was brought to the operating room where she was administered combined spinal and epidural anesthetic without complications.  She was prepped and draped in the usual sterile fashion.  Foley catheter placed.  After achieving adequate anesthesia,  Pfannenstiel skin incision was made with a scalpel and carried down to the fascia which was nicked in the midline and opened transversely using Mayo scissors. The rectus muscles were dissected sharply in the midline and the peritoneum entered  sharply.  The bladder blade placed, visceroperitoneum then scored in a smile-like fashion.  Sharl Ma hysterotomy incision made for delivery of a fullterm living female fetus from the occipitoposterior position and handed to pediatricians  in attendance with Apgars of 8 and 9.  The placenta delivered manually and intact.  Three vessel cord noted.  The uterus was exteriorized.  Atony controlled using IV Pitocin. Normal tubes and ovaries are noted.  The uterine incision was closed using a 0 Monocryl in a continuous running fashion.  Second imbricating layer placed. The bladder flap was inspected and found to be hemostatic.  The pericolic gutters irrigated.  All blood clots subsequently removed.  The fascia was then closed using a 0 Vicryl in a continuous running fashion.  The skin was closed using staples.  Dilute Marcaine solution placed.  The patient tolerates the procedure well and s sent to recovery room in good condition. DD:  10/22/99 TD:  10/23/99 Job: [id number]

## 2010-11-23 NOTE — Discharge Summary (Signed)
NAME:  [name] [name], [name] [name]                        ACCOUNT NO.:  [id number]   MEDICAL RECORD NO.:  [id number]                   PATIENT TYPE:  INP   LOCATION:  9108                                 FACILITY:  [facility]   PHYSICIAN:  Maxie Better, M.D.            DATE OF BIRTH:  1966-04-23   DATE OF ADMISSION:  05/23/2002  DATE OF DISCHARGE:  05/27/2002                                 DISCHARGE SUMMARY   ADMISSION DIAGNOSES:  1. Preterm premature rupture of membranes.  2. Previous cesarean section.  3. Intrauterine gestation at 64 and 2/7ths weeks.  4. Desires permanent sterilization.  5. Breech presentation.   DISCHARGE DIAGNOSES:  1. Preterm premature rupture of membranes.  2. Intrauterine gestation at 25 and 2/7ths weeks, delivered.  3. Desires sterilization.  4. Complete breech presentation.  5. Status post repeat cesarean section, modified Pomeroy tubal ligation.   HISTORY OF PRESENT ILLNESS:  This is a 45 year old, G86, P36-0-2-1, female at  56 and 1/7ths weeks gestation who presented with spontaneous rupture of  membranes.  The patient's history is notable for previous cesarean section  secondary to severe preeclampsia at term.  The patient also has significant  asthma and acid reflux, for which she is on multiple medications.   HOSPITAL COURSE:  The patient was admitted.  Vaginal pool of the amniotic  fluid was collected which showed an immature fetal lung maturity, and LSPG  was sent.  Group B strep culture was also performed.  Ultrasound confirmed  breech presentation.  CBC on admission showed a hemoglobin of 12.9,  hematocrit 36.6, white count of 14.8, platelet count 268,000.  Repeat PIH  labs were done that were normal.  Lung maturity subsequently showed LS of  3.3, PG present.  Given that finding, an acute NICU consultation was  obtained.  The patient was placed on continuous fetal monitoring which  showed reactive nonstress test with occasional contractions.  The  decision  was made to proceed with a repeat cesarean section and tubal ligation  thereafter.  The patient was taken to the operating room with epidural  anesthesia.  She had a delivery of a live female with the cord around the left  hand.  The weight of the baby was 5 pounds, 7 ounces.  He was in a complete  breech presentation.  Apgars of 8/9.  No intracavitary lesions were noted.  Normal tubes and ovaries were noted at the time.  The placenta was sent to  pathology that showed some mild acute chorioamnionitis and an accessory lobe  of the placenta.  A portion of both fallopian tubes have been removed and  pathology confirmed.  The patient had an unremarkable postoperative course.  She was continued on her antiasthmatic medications.  Her CBC on  postoperative day #1 showed a hemoglobin of 10.2, hematocrit 29, white count  of 22.7, platelet count 263,000.  By postoperative day #3, the patient was  tolerating a  regular diet, she had flatus.  The patient had a transient  episode of urinary retention which subsequently resolved.  Her incision  showed no erythema, induration, or exudate.  She was deemed well to be  discharged home.   DISPOSITION:  Home.   CONDITION:  Stable.   DISCHARGE FOLLOW UP:  In 4-6 weeks at [facility].   DISCHARGE MEDICATIONS:  Continue all asthma medications and antireflux  medications.  Tylox, #30, one p.o. q.4h. p.r.n. pain.  P.O. vitamins one  p.o. daily.  Over-the-counter iron supplementation one p.o. daily.   DISCHARGE INSTRUCTIONS:  1. Call for temperature greater than or equal to 100.4.  2. Nothing per vaginal for 4-6 weeks.  3. No heavy lifting or driving for two weeks.  4. Call if increased incisional pain, redness or drainage from the incision     site, severe abdominal pain, nausea, vomiting.                                               Maxie Better, M.D.    Harrison/MEDQ  D:  07/04/2002  T:  07/04/2002  Job:  [id number]

## 2010-11-23 NOTE — Op Note (Signed)
[name] [name], LAVEY              ACCOUNT NO.:  [id number]   MEDICAL RECORD NO.:  [id number]          PATIENT TYPE:  INP   LOCATION:  1419                         FACILITY:  [facility]   PHYSICIAN:  Thornton Park. Daphine Deutscher, MD  DATE OF BIRTH:  [birth year]   DATE OF PROCEDURE:  08/28/2005  DATE OF DISCHARGE:                                 OPERATIVE REPORT   PREOPERATIVE DIAGNOSIS:  This is a 45 year old woman with a 6-year history  of worsening gastroesophageal reflux disease. She also has adult-onset  asthma and on upper GI had a moderately large sliding hiatal hernia with  significant GE reflux.   POSTOPERATIVE DIAGNOSIS:  Same.   PROCEDURE:  Laparoscopic Nissen fundoplication over a #50 lighted bougie  with a two suture pledgeted closure of the hiatus posteriorly and a three  suture wrap.   SURGEON:  Thornton Park. Daphine Deutscher, MD   ASSISTANT:  Anselm Pancoast. Zachery Dakins, M.D.   ANESTHESIA:  General endotracheal.   DESCRIPTION OF PROCEDURE:  The patient was taken to room one and the abdomen  was prepped with chlorhexidine after intubation. Access to the abdomen was  gained to the left upper quadrant using an Optiview technique and a zero  degree scope entering without difficulty and insufflating. Two other trocars  were placed to the right of the midline, one below the umbilicus and a 5 mm  in the upper midline for the neck Nathanson liver retractor. Survey of the  abdomen revealed some adhesions down in her pelvis and it looked like there  was a small midline possible fascial defect where she had had a C-section.  The Satira Mccallum was inserted and the hiatal hernia was visualized and it was  prominent in that the stomach was up in the chest. I went ahead and grasped  the stomach and reduced it and used a harmonic to take down the pars  flaccida and expose the right crus and then incise that along the crus and  carried anteriorly across the pharyngoesophageal ligament dividing this and  then went over  to the left crus.   I next took down short gastric's on the left side with a harmonic and then  completely delineated the left crus and the esophagogastric junction. I then  worked from the patient's right side to place two pledgeted sutures and to  close the hiatus. I then passed a 50 lighted bougie into the stomach. I then  reached around and grasped the stomach and brought contiguous portion of  stomach which I wrapped and sutured to with three sutures __________ suture.  These were tied intracorporeally and each got a bite of esophagus. Prior to  doing that, I did defat the esophagogastric  junction as it was a large fat pad and would be obscuring that. A nice wrap  was present. No bleeding was noted. Port sites were then injected with  Marcaine. I surveyed again and withdrew the   Dictation stopped here.      Thornton Park Daphine Deutscher, MD  Electronically Signed     MBM/MEDQ  D:  08/28/2005  T:  08/29/2005  Job:  [id number]  cc:   Lina Sar, M.D. [facility]  [address]  [place]  [place] [address]   Leanne Chang, M.D.  Fax: [phone]   Eugenia Pancoast, MD

## 2010-11-23 NOTE — Assessment & Plan Note (Signed)
[facility]                             PULMONARY OFFICE NOTE   NAME:HARGROVELulubelle, Simcoe                     MRN:          [id number]  DATE:08/21/2006                            DOB:          Sep 30, 1965    HISTORY OF PRESENT ILLNESS:  The patient is a 45 year old white female  patient of Dr. Georgann Housekeeper who has a known history of asthma complicated  by gastroesophageal reflux, much improved after a Nissen fundoplication  in 2007.  The patient presents today for a acute office visit  complaining of a 2 day history of cough, nasal congestion, postnasal  drip and chest tightness.  The patient also complains that this morning  she had some loose stool.  The patient denies any chest pain, orthopnea,  PND, leg swelling, recent travel, antibiotic use, bloody stools.   PAST MEDICAL HISTORY:  Reviewed.   CURRENT MEDICATIONS:  Reviewed.   PHYSICAL EXAMINATION:  The patient is a pleasant female in no acute  distress.  She is afebrile with stable vital signs.  O2 saturation is 98% on room  air.  HEENT:  Nasal mucosa was slightly pale.  Nontender sinuses.  Posterior  pharynx was clear without any exudate or redness.  NECK:  Supple without adenopathy.  LUNGS:  Sounds are clear without any wheezes or crackles.  CARDIAC:  Regular rate.  ABDOMEN:  Soft and nontender.  EXTREMITIES:  Warm without any edema.   IMPRESSION AND PLAN:  Acute upper respiratory infection with mild  asthmatic flare up.  The patient is to begin Mucinex DM twice a day.  Continue on Zyrtec daily.  Saline nasal spray as needed.  The patient  was given a Xopenex nebulizer treatment in office.  The patient to  return back with Dr. Jayme Cloud as scheduled or sooner if needed.      Rubye Oaks, NP  Electronically Signed      Gailen Shelter, MD  Electronically Signed   TP/MedQ  DD: 08/21/2006  DT: 08/21/2006  Job #: [id number]

## 2011-01-17 ENCOUNTER — Ambulatory Visit (INDEPENDENT_AMBULATORY_CARE_PROVIDER_SITE_OTHER): Payer: BC Managed Care – PPO | Admitting: Adult Health

## 2011-01-17 ENCOUNTER — Encounter: Payer: Self-pay | Admitting: Adult Health

## 2011-01-17 DIAGNOSIS — J209 Acute bronchitis, unspecified: Secondary | ICD-10-CM

## 2011-01-17 MED ORDER — AZITHROMYCIN 250 MG PO TABS: ORAL_TABLET | ORAL | Status: AC

## 2011-01-17 MED ORDER — PREDNISONE 10 MG PO TABS: ORAL_TABLET | ORAL | Status: AC

## 2011-01-17 NOTE — Assessment & Plan Note (Signed)
Flare with associated URI   Plan  Zpack take as directed  Mucinex DM Twice daily As needed  Cough/congestion  Prednisone taper over next week.  Fluids and rest  Please contact office for sooner follow up if symptoms do not improve or worsen or seek emergency care  follow up Dr. Vassie Loll  In 3 months

## 2011-01-17 NOTE — Patient Instructions (Signed)
Zpack take as directed  Mucinex DM Twice daily As needed  Cough/congestion  Prednisone taper over next week.  Fluids and rest  Please contact office for sooner follow up if symptoms do not improve or worsen or seek emergency care  follow up Dr. Vassie Loll  In 3 months

## 2011-01-17 NOTE — Progress Notes (Signed)
Subjective:    Patient ID: [name] [name], female    DOB: December 25, 1965, 45 y.o.   MRN: [id number]  HPI  45 yo WF, with known history of Asthma since childhood, worse during & after pregnancy 2001.  Triggers - GERD vs allergies  Hx of severe reflux for many yrs, improved after Nissen's 2 yr ago   02/08/08-- Pt woke up choking in middle of night, since then throat feels raw, has had dry cough, and felt wheezy >> steroids  9/1>> much improved, but c/o chest tightness, spirometry nml, FEv1 99%  Breakthrough reflux 4-5/wk, takes tums every night with ranitidine; reglan did not work (dysmotility noted on studies by Dr. Daphine Deutscher post Nissen's in 2008).  2 exacerbations in 2010 requiring steroids.   Nov 06, 2009 9:23 AM  [facility] has stopped zoloft/wellbutrin and has lost 30lbs. GERD under good control - off omeprazole, on tums twice/ month.   July 04, 2010 9:34 AM  c/o URI 3 wks ago, now cough & wheezing  c/o chest tightness, unable to take deep breaths, dry cough, chest congestion x 3 weeks, PND, neck tender to touch x 2 days. Denies fever, n/v/d, and sweats. Is using Symbicort 1 puff twice daily.    10/24/10  Follow Up Pt returns for 4 month follow up. Says she has been doing very well. Uses symbicort everyday- no missed doses. Rare uses of Albuterol.  Has had some itchy eyes, nasal drip and PND. Using zyrtec daily. No discolored mucus. No fever.  Activity tolerance is good. No wheezing. >>no changes  01/17/2011 Acute Ov  Pt presents for an acute office visit . Complains of hoarseness, increased SOB, cough occasionally producing white mucus, PND, chest tightness x2weeks. OTC not helping. Cough is getting worse. Worse in am and late night.  NO hemoptysis. Or fever. Doing well until last 2 weeks. Increased SABA use.   Review of Systems  Constitutional:   No  weight loss, night sweats,  Fevers, chills, fatigue, or  lassitude.  HEENT:   No headaches,  Difficulty swallowing,  Tooth/dental problems,  or  Sore throat,                + sneezing, itching,  post nasal drip,   CV:  No chest pain,  Orthopnea, PND, swelling in lower extremities, anasarca, dizziness, palpitations, syncope.   GI  No heartburn, indigestion, abdominal pain, nausea, vomiting, diarrhea, change in bowel habits, loss of appetite, bloody stools.   Resp:    No coughing up of blood.   No chest wall deformity  Skin: no rash or lesions.  GU: no dysuria, change in color of urine, no urgency or frequency.  No flank pain, no hematuria   MS:  No joint pain or swelling.  No decreased range of motion.  No back pain.  Psych:  No change in mood or affect. No depression or anxiety.  No memory loss.         Objective:   Physical Exam  GEN: A/Ox3; pleasant , NAD, well nourished   HEENT:  [place]/AT,  EACs-clear, TMs-wnl, NOSE-clear drainage , THROAT-clear, no lesions, no postnasal drip or exudate noted.   NECK:  Supple w/ fair ROM; no JVD; normal carotid impulses w/o bruits; no thyromegaly or nodules palpated; no lymphadenopathy.  RESP  Coarse BS  w/o, wheezes/ rales/ or rhonchi.no accessory muscle use, no dullness to percussion  CARD:  RRR, no m/r/g  , no peripheral edema, pulses intact, no cyanosis or clubbing.  GI:  Soft & nt; nml bowel sounds; no organomegaly or masses detected.  Musco: Warm bil, no deformities or joint swelling noted.   Neuro: alert, no focal deficits noted.    Skin: Warm, no lesions or rashes         Assessment & Plan:

## 2011-01-21 ENCOUNTER — Telehealth: Payer: Self-pay | Admitting: Pulmonary Disease

## 2011-01-21 NOTE — Telephone Encounter (Signed)
Pt is aware of TP recs and will call if not improving.

## 2011-01-21 NOTE — Telephone Encounter (Signed)
I spoke with the pt and she states she saw TP on 01-17-11 and was given pred taper and zpak. Pt states usually after a couple of days on these meds she feels better but this time she states she has not seen any real improvement. She states on day 1 and 2 her cough had improved some, but it then began to get worse. She also c/o still having increased SOB, chest tightness and chest congestion. She states she has been able to cough up some phlegm and it is yellow, but only in small amounts. Please advise. Carron Curie, CMA Allergies  Allergen Reactions  . Penicillins     REACTION: childhood - hives/rash  . Sulfonamide Derivatives     REACTION: childhood - hives/rash

## 2011-01-21 NOTE — Telephone Encounter (Signed)
Needs to give it a little more time  Use mucinex dm ., saline nasal rinses As needed   Fluids. tyenol  Please contact office for sooner follow up if symptoms do not improve or worsen or seek emergency care  Finish rx for abx and steroids completely . Zpack acts for 10 days total.

## 2011-03-27 LAB — HEMOGLOBIN AND HEMATOCRIT, BLOOD
HCT: 37.5
Hemoglobin: 12.9

## 2011-03-27 LAB — PREGNANCY, URINE: Preg Test, Ur: NEGATIVE

## 2011-04-02 LAB — BASIC METABOLIC PANEL
BUN: 17
CO2: 21
Calcium: 8.6
Chloride: 103
Creatinine, Ser: 1.03
GFR calc Af Amer: >60
GFR calc non Af Amer: 60 — ABNORMAL LOW
Glucose, Bld: 149 — ABNORMAL HIGH
Potassium: 3.4 — ABNORMAL LOW
Sodium: 136

## 2011-06-04 ENCOUNTER — Other Ambulatory Visit: Payer: Self-pay | Admitting: Obstetrics and Gynecology

## 2011-06-04 DIAGNOSIS — Z1231 Encounter for screening mammogram for malignant neoplasm of breast: Secondary | ICD-10-CM

## 2011-06-07 ENCOUNTER — Telehealth: Payer: Self-pay | Admitting: Family Medicine

## 2011-06-07 NOTE — Telephone Encounter (Signed)
Patient called in this morning with L arm & shoulder pain that goes up into her armpit. I made her an appt for Tuesday, because she is out of town Monday. She would like you to call her so she can tell you more, in case you feel she needs to be seen today. Please call and work in today if necessary. Thanks!

## 2011-06-07 NOTE — Telephone Encounter (Signed)
Patient is aware 

## 2011-06-07 NOTE — Telephone Encounter (Signed)
It sounds to be muscular. Try Motrin or Advil 800 mg (or 4 pills) every 6 hours prn pain. See me next week if not better

## 2011-06-07 NOTE — Telephone Encounter (Signed)
Pain started yesterday.  She helped her mom put up Christmas decorations.  She denies chest pain and nausea.   She tried OTC and still has some dull pain.  Any suggestions.

## 2011-06-11 ENCOUNTER — Ambulatory Visit: Payer: BC Managed Care – PPO | Admitting: Family Medicine

## 2011-06-14 ENCOUNTER — Telehealth: Payer: Self-pay | Admitting: Pulmonary Disease

## 2011-06-14 MED ORDER — BUDESONIDE-FORMOTEROL FUMARATE 160-4.5 MCG/ACT IN AERO: 2.0000 | INHALATION_SPRAY | Freq: Two times a day (BID) | RESPIRATORY_TRACT | Status: DC

## 2011-06-14 MED ORDER — ALBUTEROL SULFATE HFA 108 (90 BASE) MCG/ACT IN AERS: 2.0000 | INHALATION_SPRAY | RESPIRATORY_TRACT | Status: DC | PRN

## 2011-06-14 NOTE — Telephone Encounter (Signed)
RX for Symbicort and rescue inhaler sent to pt's pharmacy with a note for pt to call for an appointment.

## 2011-06-17 ENCOUNTER — Other Ambulatory Visit: Payer: Self-pay | Admitting: *Deleted

## 2011-06-17 MED ORDER — BUDESONIDE-FORMOTEROL FUMARATE 160-4.5 MCG/ACT IN AERO: 2.0000 | INHALATION_SPRAY | Freq: Two times a day (BID) | RESPIRATORY_TRACT | Status: DC

## 2011-06-20 ENCOUNTER — Ambulatory Visit (INDEPENDENT_AMBULATORY_CARE_PROVIDER_SITE_OTHER): Payer: BC Managed Care – PPO | Admitting: Family Medicine

## 2011-06-20 ENCOUNTER — Encounter: Payer: Self-pay | Admitting: Family Medicine

## 2011-06-20 DIAGNOSIS — R079 Chest pain, unspecified: Secondary | ICD-10-CM

## 2011-06-20 MED ORDER — OMEPRAZOLE 40 MG PO CPDR: 40.0000 mg | DELAYED_RELEASE_CAPSULE | Freq: Every day | ORAL | Status: DC

## 2011-06-20 NOTE — Progress Notes (Signed)
  Subjective:    Patient ID: [name] [name], female    DOB: 19-Aug-1965, 45 y.o.   MRN: [id number]  HPI Here for 2 days of intermittent squeezing type discomfort in the right chest. No SOB or nausea or sweats. This is not related to exertion. No trouble swallowing. She has a hx of GERD and was on Aciphex for this for a while, but after she had her [employer] fundiplication her reflux symptoms went away. She admits to being under a lot of stress the past few weeks.    Review of Systems  Constitutional: Negative.   Respiratory: Negative.   Cardiovascular: Positive for chest pain. Negative for palpitations and leg swelling.  Gastrointestinal: Negative.        Objective:   Physical Exam  Constitutional: She appears well-developed and well-nourished.  Neck: Neck supple. No thyromegaly present.  Cardiovascular: Normal rate, regular rhythm, normal heart sounds and intact distal pulses.  Exam reveals no gallop and no friction rub.   No murmur heard. Pulmonary/Chest: Breath sounds normal. No respiratory distress. She has no wheezes. She has no rales. She exhibits no tenderness.  Lymphadenopathy:    She has no cervical adenopathy.          Assessment & Plan:  Probable esophageal spasms from GERD. Try Omeprazole daily. Recheck if not better in 2 weeks

## 2011-06-28 ENCOUNTER — Ambulatory Visit
Admission: RE | Admit: 2011-06-28 | Discharge: 2011-06-28 | Disposition: A | Discharge status: Home / Self Care | Payer: BC Managed Care – PPO | Source: Ambulatory Visit | Attending: Obstetrics and Gynecology | Admitting: Obstetrics and Gynecology

## 2011-06-28 DIAGNOSIS — Z1231 Encounter for screening mammogram for malignant neoplasm of breast: Secondary | ICD-10-CM

## 2011-08-23 ENCOUNTER — Encounter: Payer: Self-pay | Admitting: Family Medicine

## 2011-08-23 ENCOUNTER — Ambulatory Visit (INDEPENDENT_AMBULATORY_CARE_PROVIDER_SITE_OTHER): Payer: BC Managed Care – PPO | Admitting: Family Medicine

## 2011-08-23 VITALS — BP 108/70 | HR 102 | Temp 98.5°F | Wt 148.0 lb

## 2011-08-23 DIAGNOSIS — R590 Localized enlarged lymph nodes: Secondary | ICD-10-CM

## 2011-08-23 DIAGNOSIS — M545 Low back pain, unspecified: Secondary | ICD-10-CM

## 2011-08-23 DIAGNOSIS — R599 Enlarged lymph nodes, unspecified: Secondary | ICD-10-CM

## 2011-08-23 DIAGNOSIS — L259 Unspecified contact dermatitis, unspecified cause: Secondary | ICD-10-CM

## 2011-08-23 DIAGNOSIS — L309 Dermatitis, unspecified: Secondary | ICD-10-CM

## 2011-08-23 LAB — POCT URINALYSIS DIPSTICK
Bilirubin, UA: NEGATIVE
Glucose, UA: NEGATIVE
Ketones, UA: NEGATIVE
Leukocytes, UA: NEGATIVE
Nitrite, UA: NEGATIVE
Protein, UA: NEGATIVE
Spec Grav, UA: 1.02
Urobilinogen, UA: 0.2
pH, UA: 6

## 2011-08-23 MED ORDER — TRIAMCINOLONE ACETONIDE 0.1 % EX CREA: TOPICAL_CREAM | Freq: Two times a day (BID) | CUTANEOUS | Status: DC

## 2011-08-23 NOTE — Progress Notes (Signed)
  Subjective:    Patient ID: [name] [name], female    DOB: 01/30/1966, 46 y.o.   MRN: [id number]  HPI Here for two things. First she noticed a small lump come up under the right side of her chin one month ago. It is not tender and is not changing in size. This appeared shortly after she had a dental cleaning. Second, she has had patches of red itchy skin on her chest, arms, and back all winter. Using OTC cortisone creams.    Review of Systems  Constitutional: Negative.   HENT: Negative.   Eyes: Negative.   Skin: Positive for rash.       Objective:   Physical Exam  Constitutional: She appears well-developed and well-nourished.  HENT:  Head: Normocephalic and atraumatic.  Right Ear: External ear normal.  Left Ear: External ear normal.  Nose: Nose normal.  Mouth/Throat: Oropharynx is clear and moist. No oropharyngeal exudate.  Eyes: Conjunctivae are normal.  Neck: Neck supple. No thyromegaly present.       Single small nontender lymph node under the right lower jaw  Skin:       Patches or macular red scaly skin as above           Assessment & Plan:  This is a reactive lymph node which probably was caused by her dental cleaning procedure. This should resolve on its own over the next few weeks. She will follow up prn. Use Triamcinolone cream on the eczema.

## 2011-10-02 ENCOUNTER — Telehealth: Payer: Self-pay | Admitting: Pulmonary Disease

## 2011-10-02 MED ORDER — BUDESONIDE-FORMOTEROL FUMARATE 160-4.5 MCG/ACT IN AERO: INHALATION_SPRAY | RESPIRATORY_TRACT | Status: DC

## 2011-10-02 NOTE — Telephone Encounter (Signed)
Spoke with pt and scheduled her first available with RA for [place] office 12/05/11 at 1:30 pm and advised keep appt for more refills. Rx was refilled to last until her appt date. Pt states nothing further needed.

## 2011-12-03 ENCOUNTER — Ambulatory Visit: Payer: BC Managed Care – PPO | Admitting: Family Medicine

## 2011-12-04 ENCOUNTER — Encounter: Payer: Self-pay | Admitting: Family Medicine

## 2011-12-04 ENCOUNTER — Ambulatory Visit (INDEPENDENT_AMBULATORY_CARE_PROVIDER_SITE_OTHER): Payer: BC Managed Care – PPO | Admitting: Family Medicine

## 2011-12-04 VITALS — BP 106/70 | HR 87 | Temp 98.6°F | Wt 148.0 lb

## 2011-12-04 DIAGNOSIS — M546 Pain in thoracic spine: Secondary | ICD-10-CM

## 2011-12-04 NOTE — Progress Notes (Signed)
  Subjective:    Patient ID: [name] [name], female    DOB: Sep 25, 1965, 46 y.o.   MRN: [id number]  HPI Here for mild intermittent pain in the right side of the middle back since January of this year. This pain started shortly after she fell while roller skating and landed on her bottom. The pain has a pressure quality, not sharp. A single Advil gives her relief.    Review of Systems  Constitutional: Negative.   Respiratory: Negative.   Cardiovascular: Negative.   Musculoskeletal: Positive for back pain.       Objective:   Physical Exam  Constitutional: She appears well-developed and well-nourished. No distress.  Musculoskeletal:       The spine shows normal alignment, no scoliosis. No spinal tenderness but she is mildly tender to the right paraspinal area toward the scapula. Full ROM           Assessment & Plan:  Mid back pain, probably related to her fall. Get Xrays today. If these are normal, I will advise that she try yoga or Pilates exercises to help with her core strength.

## 2011-12-05 ENCOUNTER — Encounter: Payer: Self-pay | Admitting: Pulmonary Disease

## 2011-12-05 ENCOUNTER — Ambulatory Visit (INDEPENDENT_AMBULATORY_CARE_PROVIDER_SITE_OTHER): Payer: BC Managed Care – PPO | Admitting: Pulmonary Disease

## 2011-12-05 ENCOUNTER — Ambulatory Visit (INDEPENDENT_AMBULATORY_CARE_PROVIDER_SITE_OTHER)
Admission: RE | Admit: 2011-12-05 | Discharge: 2011-12-05 | Disposition: A | Discharge status: Home / Self Care | Payer: BC Managed Care – PPO | Source: Ambulatory Visit | Attending: Family Medicine | Admitting: Family Medicine

## 2011-12-05 VITALS — BP 108/60 | HR 89 | Temp 98.2°F | Ht 62.0 in | Wt 148.4 lb

## 2011-12-05 DIAGNOSIS — M546 Pain in thoracic spine: Secondary | ICD-10-CM

## 2011-12-05 DIAGNOSIS — J45909 Unspecified asthma, uncomplicated: Secondary | ICD-10-CM

## 2011-12-05 MED ORDER — AZITHROMYCIN 250 MG PO TABS: ORAL_TABLET | ORAL | Status: DC

## 2011-12-05 MED ORDER — BUDESONIDE-FORMOTEROL FUMARATE 160-4.5 MCG/ACT IN AERO: INHALATION_SPRAY | RESPIRATORY_TRACT | Status: DC

## 2011-12-05 MED ORDER — ALBUTEROL SULFATE HFA 108 (90 BASE) MCG/ACT IN AERS: 2.0000 | INHALATION_SPRAY | RESPIRATORY_TRACT | Status: DC | PRN

## 2011-12-05 NOTE — Patient Instructions (Signed)
Z-pak Rx - take this if phlegm stays yellow SYmbicort & albuterol refills sent Trial of qnasl 1 spray each nare at bedtime

## 2011-12-05 NOTE — Assessment & Plan Note (Addendum)
Triggers incl allergic rhinitis, GERD & bronchitis Z-pak Rx - take this if phlegm stays yellow SYmbicort & albuterol refills sent Trial of qnasl 1 spray each nare at bedtime - hope to decrease usage of zyrtec D Can consider singulair in the future

## 2011-12-05 NOTE — Progress Notes (Signed)
  Subjective:    Patient ID: [name] [name], female    DOB: [birth year], 46 y.o.   MRN: [id number]  HPI 46 yo WF, with known history of Asthma since childhood, worse during & after pregnancy 2001.  Triggers - GERD vs allergies  Hx of severe reflux for many yrs, improved after Nissen's 2008.   9/09 >> spirometry nml, FEv1 99%  Breakthrough reflux 4-5/wk, takes tums every night with ranitidine; reglan did not work (dysmotility noted on studies by Dr. Daphine Deutscher post Nissen's in 2008).  2 exacerbations in 2010 requiring steroids.  Nov 06, 2009 - stopped zoloft/wellbutrin and has lost 30lbs. GERD under good control - off omeprazole, on tums twice/ month.     12/05/2011 1 yr FU Pt c/o nasal congestion, chest tx, some chest congestion, cough w/ little yellow phlem, PND, facial pressure, ear pressure x 3 days--taking mucinex Went camping -  Sinus rinse helps Increased SABA use - no nocturnal wheeze Compliant with symbicort    Review of Systems Patient denies significant dyspnea,cough, hemoptysis,  chest pain, palpitations, pedal edema, orthopnea, paroxysmal nocturnal dyspnea, lightheadedness, nausea, vomiting, abdominal or  leg pains      Objective:   Physical Exam  Gen. Pleasant, well-nourished, in no distress ENT - no lesions, no post nasal drip Neck: No JVD, no thyromegaly, no carotid bruits Lungs: no use of accessory muscles, no dullness to percussion, clear without rales or rhonchi  Cardiovascular: Rhythm regular, heart sounds  normal, no murmurs or gallops, no peripheral edema Musculoskeletal: No deformities, no cyanosis or clubbing        Assessment & Plan:

## 2011-12-10 ENCOUNTER — Telehealth: Payer: Self-pay | Admitting: Pulmonary Disease

## 2011-12-10 NOTE — Telephone Encounter (Signed)
Spoke with pt. She was seen by RA on 12-05-11. States that she had to take zpack due to chest tightness and increased SOB but this has not helped. She states having to use rescue inhaler 3-4 times per day, this helps relieve symptoms. She denies f/c/s, CP, wheeze. I offered ov with TP in [place] today and she declined, stating that RA told her that she would not have to come in if the zpack did not help and that he would prescribe something without ov. I advised that he is off until 12-13-11 and she states okay to wait until then. She wants her msg to be answered by RA only. I advised will forward msg to RA and in the meantime call us if worsens or seek emergent care. Please advise, thanks! Allergies  Allergen Reactions  . Penicillins     REACTION: childhood - hives/rash  . Sulfonamide Derivatives     REACTION: childhood - hives/rash

## 2011-12-13 MED ORDER — PREDNISONE 10 MG PO TABS: ORAL_TABLET | ORAL | Status: DC

## 2011-12-13 NOTE — Telephone Encounter (Signed)
Pl send Rx for pred 20 mg x 5ds , then10 mg x 5 ds - call us if no better in 5 ds

## 2011-12-13 NOTE — Telephone Encounter (Signed)
Rx for prednisone was sent to pharm Left detailed VM for pt to be made aware per her request.

## 2012-05-19 ENCOUNTER — Telehealth: Payer: Self-pay | Admitting: Pulmonary Disease

## 2012-05-19 NOTE — Telephone Encounter (Signed)
Recall Appointment: °Called patient to schedule follow up apt 3 times and left message. Sent letter 05/19/12.  °

## 2012-05-21 ENCOUNTER — Other Ambulatory Visit: Payer: Self-pay | Admitting: Obstetrics and Gynecology

## 2012-05-21 DIAGNOSIS — Z1231 Encounter for screening mammogram for malignant neoplasm of breast: Secondary | ICD-10-CM

## 2012-06-10 ENCOUNTER — Telehealth: Payer: Self-pay | Admitting: Pulmonary Disease

## 2012-06-10 ENCOUNTER — Encounter: Payer: Self-pay | Admitting: Family Medicine

## 2012-06-10 ENCOUNTER — Ambulatory Visit (INDEPENDENT_AMBULATORY_CARE_PROVIDER_SITE_OTHER): Payer: BC Managed Care – PPO | Admitting: Family Medicine

## 2012-06-10 VITALS — BP 110/78 | HR 100 | Temp 98.7°F | Wt 155.0 lb

## 2012-06-10 DIAGNOSIS — L309 Dermatitis, unspecified: Secondary | ICD-10-CM

## 2012-06-10 DIAGNOSIS — L259 Unspecified contact dermatitis, unspecified cause: Secondary | ICD-10-CM

## 2012-06-10 DIAGNOSIS — J329 Chronic sinusitis, unspecified: Secondary | ICD-10-CM

## 2012-06-10 MED ORDER — HALOBETASOL PROPIONATE 0.05 % EX CREA: TOPICAL_CREAM | Freq: Two times a day (BID) | CUTANEOUS | Status: DC

## 2012-06-10 MED ORDER — AZITHROMYCIN 250 MG PO TABS: ORAL_TABLET | ORAL | Status: DC

## 2012-06-10 MED ORDER — BUDESONIDE-FORMOTEROL FUMARATE 160-4.5 MCG/ACT IN AERO: INHALATION_SPRAY | RESPIRATORY_TRACT | Status: DC

## 2012-06-10 NOTE — Progress Notes (Signed)
  Subjective:    Patient ID: [name] [name], female    DOB: 05/08/1966, 46 y.o.   MRN: [id number]  HPI Here for 5 days of sinus pressure , PND, hoarseness, and a dry cough. No wheezing or SOB.    Review of Systems  Constitutional: Negative.   HENT: Positive for congestion, postnasal drip and sinus pressure.   Eyes: Negative.   Respiratory: Positive for cough. Negative for chest tightness, shortness of breath and wheezing.        Objective:   Physical Exam  Constitutional: She appears well-developed and well-nourished.  HENT:  Right Ear: External ear normal.  Left Ear: External ear normal.  Nose: Nose normal.  Mouth/Throat: Oropharynx is clear and moist.  Eyes: Conjunctivae normal are normal.  Pulmonary/Chest: Effort normal and breath sounds normal. No respiratory distress. She has no wheezes. She has no rales.  Lymphadenopathy:    She has no cervical adenopathy.          Assessment & Plan:  Add Mucinex prn

## 2012-06-10 NOTE — Telephone Encounter (Signed)
Called and spoke with patient, requesting sample of symbicort.  Patient aware that sample is left upfront for her pickup. Nothing further needed at this time.

## 2012-06-22 ENCOUNTER — Ambulatory Visit (INDEPENDENT_AMBULATORY_CARE_PROVIDER_SITE_OTHER): Payer: BC Managed Care – PPO | Admitting: Pulmonary Disease

## 2012-06-22 ENCOUNTER — Encounter: Payer: Self-pay | Admitting: Pulmonary Disease

## 2012-06-22 VITALS — BP 102/64 | HR 96 | Temp 98.4°F | Ht 62.0 in | Wt 157.4 lb

## 2012-06-22 DIAGNOSIS — J45909 Unspecified asthma, uncomplicated: Secondary | ICD-10-CM

## 2012-06-22 MED ORDER — CEFDINIR 300 MG PO CAPS: 300.0000 mg | ORAL_CAPSULE | Freq: Two times a day (BID) | ORAL | Status: DC

## 2012-06-22 MED ORDER — BUDESONIDE-FORMOTEROL FUMARATE 160-4.5 MCG/ACT IN AERO: INHALATION_SPRAY | RESPIRATORY_TRACT | Status: DC

## 2012-06-22 MED ORDER — ALBUTEROL SULFATE HFA 108 (90 BASE) MCG/ACT IN AERS: 2.0000 | INHALATION_SPRAY | RESPIRATORY_TRACT | Status: DC | PRN

## 2012-06-22 NOTE — Patient Instructions (Addendum)
Omnicef x 7days SYmbicort Rx x 3 months supply Rescue inhaler refill Stay on symbicort  2 puffs twice daily

## 2012-06-22 NOTE — Progress Notes (Signed)
  Subjective:    Patient ID: [name] [name], female    DOB: 08/28/1965, 46 y.o.   MRN: [id number]  HPI 46 yo WF, with known history of Asthma since childhood, worse during & after pregnancy 2001.  Triggers - GERD vs allergies  Hx of severe reflux for many yrs, improved after Nissen's 2008.  9/09 >> spirometry nml, FEv1 99%  Breakthrough reflux 4-5/wk, takes tums every night with ranitidine; reglan did not work (dysmotility noted on studies by Dr. Daphine Deutscher post Nissen's in 2008).  2 exacerbations in 2010 requiring steroids.   Nov 06, 2009 - stopped zoloft/wellbutrin and lost 30lbs. GERD under good control - off omeprazole, on tums twice/ month.    06/22/2012 46m FU Compliant with symbicort  - taking 2 puffs bid No nocturnal symps  c/o cough w/ phlem (not sure what color), sinus congestion, PND, feels tired. was giving ZPAK 2 weeks ago by Dr. Clent Ridges. when she takes the symbicort in the mornings she is getting tight by the evening time  Review of Systems neg for any significant sore throat, dysphagia, itching, sneezing, nasal congestion or excess/ purulent secretions, fever, chills, sweats, unintended wt loss, pleuritic or exertional cp, hempoptysis, orthopnea pnd or change in chronic leg swelling. Also denies presyncope, palpitations, heartburn, abdominal pain, nausea, vomiting, diarrhea or change in bowel or urinary habits, dysuria,hematuria, rash, arthralgias, visual complaints, headache, numbness weakness or ataxia.     Objective:   Physical Exam  Gen. Pleasant, well-nourished, in no distress ENT - no lesions, no post nasal drip Neck: No JVD, no thyromegaly, no carotid bruits Lungs: no use of accessory muscles, no dullness to percussion, clear without rales or rhonchi  Cardiovascular: Rhythm regular, heart sounds  normal, no murmurs or gallops, no peripheral edema Musculoskeletal: No deformities, no cyanosis or clubbing        Assessment & Plan:

## 2012-06-22 NOTE — Assessment & Plan Note (Signed)
Omnicef x 7days SYmbicort Rx x 3 months supply Rescue inhaler refill Stay on symbicort  2 puffs twice daily May need to step up in future - by adding singulair - hold off for now mucinex

## 2012-07-06 ENCOUNTER — Ambulatory Visit
Admission: RE | Admit: 2012-07-06 | Discharge: 2012-07-06 | Disposition: A | Discharge status: Home / Self Care | Payer: BC Managed Care – PPO | Source: Ambulatory Visit | Attending: Obstetrics and Gynecology | Admitting: Obstetrics and Gynecology

## 2012-07-06 DIAGNOSIS — Z1231 Encounter for screening mammogram for malignant neoplasm of breast: Secondary | ICD-10-CM

## 2012-08-14 ENCOUNTER — Encounter: Payer: Self-pay | Admitting: Adult Health

## 2012-08-14 ENCOUNTER — Ambulatory Visit (INDEPENDENT_AMBULATORY_CARE_PROVIDER_SITE_OTHER): Payer: BC Managed Care – PPO | Admitting: Adult Health

## 2012-08-14 VITALS — BP 112/78 | HR 102 | Temp 98.4°F | Ht 62.0 in | Wt 156.6 lb

## 2012-08-14 DIAGNOSIS — J209 Acute bronchitis, unspecified: Secondary | ICD-10-CM

## 2012-08-14 DIAGNOSIS — J45909 Unspecified asthma, uncomplicated: Secondary | ICD-10-CM

## 2012-08-14 MED ORDER — PREDNISONE 10 MG PO TABS: ORAL_TABLET | ORAL | Status: DC

## 2012-08-14 MED ORDER — AZITHROMYCIN 250 MG PO TABS: ORAL_TABLET | ORAL | Status: AC

## 2012-08-14 MED ORDER — LEVALBUTEROL HCL 0.63 MG/3ML IN NEBU
0.6300 mg | INHALATION_SOLUTION | Freq: Once | RESPIRATORY_TRACT | Status: AC
  Administered 2012-08-14: 0.63 mg via RESPIRATORY_TRACT

## 2012-08-14 NOTE — Progress Notes (Signed)
  Subjective:    Patient ID: [name] [name], female    DOB: 04-May-1966, 47 y.o.   MRN: [id number]  HPI  47 yo WF, with known history of Asthma since childhood, worse during & after pregnancy 2001.  Triggers - GERD vs allergies  Hx of severe reflux for many yrs, improved after Nissen's 2008.  9/09 >> spirometry nml, FEv1 99%  Breakthrough reflux 4-5/wk, takes tums every night with ranitidine; reglan did not work (dysmotility noted on studies by Dr. Daphine Deutscher post Nissen's in 2008).  2 exacerbations in 2010 requiring steroids.   Nov 06, 2009 - stopped zoloft/wellbutrin and lost 30lbs. GERD under good control - off omeprazole, on tums twice/ month.    06/22/12 65m FU Compliant with symbicort  - taking 2 puffs bid No nocturnal symps c/o cough w/ phlem (not sure what color), sinus congestion, PND, feels tired. was giving ZPAK 2 weeks ago by Dr. Clent Ridges. when she takes the symbicort in the mornings she is getting tight by the evening time  08/14/2012 Acute OV  Complains of increased SOB, wheezing, prod cough with white mucus, wheezing, some tightness x 2 day - denies f/c/s.  has been treating with mucinex Patient denies any hemoptysis, orthopnea, PND, or leg swelling. Over-the-counter cold medicines are not helping with her symptoms.     .Review of Systems  Constitutional:   No  weight loss, night sweats,  Fevers, chills,  +fatigue, or  lassitude.  HEENT:   No headaches,  Difficulty swallowing,  Tooth/dental problems, or  Sore throat,                No sneezing, itching, ear ache,  +nasal congestion, post nasal drip,   CV:  No chest pain,  Orthopnea, PND, swelling in lower extremities, anasarca, dizziness, palpitations, syncope.   GI  No heartburn, indigestion, abdominal pain, nausea, vomiting, diarrhea, change in bowel habits, loss of appetite, bloody stools.   Resp:    No coughing up of blood.   Marland Kitchen  No chest wall deformity  Skin: no rash or lesions.  GU: no dysuria, change in color of  urine, no urgency or frequency.  No flank pain, no hematuria   MS:  No joint pain or swelling.  No decreased range of motion.  No back pain.  Psych:  No change in mood or affect. No depression or anxiety.  No memory loss.         Objective:   Physical Exam   Gen. Pleasant, well-nourished, in no distress ENT - no lesions, no post nasal drip Neck: No JVD, no thyromegaly, no carotid bruits Lungs: no use of accessory muscles, no dullness to percussion decreased BS in bases  Cardiovascular: Rhythm regular, heart sounds  normal, no murmurs or gallops, no peripheral edema Musculoskeletal: No deformities, no cyanosis or clubbing        Assessment & Plan:

## 2012-08-14 NOTE — Patient Instructions (Addendum)
Zpack to have on hold if symptoms persist or worsen with discolored mucus.  Mucinex DM Twice daily As needed  Cough/congestion  Prednisone taper over next week.  Fluids and rest  Saline nasal rinses  Mucinex Twice daily  As needed  Congestion  Delsym 2 tsp every 12 hr As needed  Cough  Zyrtec D As needed  Drainage  Prilosec 20mg  daily x 1 week when you develop cough with your asthma As needed   Please contact office for sooner follow up if symptoms do not improve or worsen or seek emergency care  follow up Dr. Vassie Loll  In 3 months

## 2012-08-17 NOTE — Assessment & Plan Note (Signed)
Flare w/ URI   Plan  pack to have on hold if symptoms persist or worsen with discolored mucus.  Mucinex DM Twice daily As needed  Cough/congestion  Prednisone taper over next week.  Fluids and rest  Saline nasal rinses  Mucinex Twice daily  As needed  Congestion  Delsym 2 tsp every 12 hr As needed  Cough  Zyrtec D As needed  Drainage  Prilosec 20mg  daily x 1 week when you develop cough with your asthma As needed   Please contact office for sooner follow up if symptoms do not improve or worsen or seek emergency care  follow up Dr. Vassie Loll  In 3 months

## 2012-12-11 ENCOUNTER — Ambulatory Visit (INDEPENDENT_AMBULATORY_CARE_PROVIDER_SITE_OTHER): Payer: BC Managed Care – PPO | Admitting: Family Medicine

## 2012-12-11 ENCOUNTER — Encounter: Payer: Self-pay | Admitting: Family Medicine

## 2012-12-11 VITALS — BP 116/68 | HR 98 | Temp 98.8°F | Wt 152.0 lb

## 2012-12-11 DIAGNOSIS — M76899 Other specified enthesopathies of unspecified lower limb, excluding foot: Secondary | ICD-10-CM

## 2012-12-11 DIAGNOSIS — M7061 Trochanteric bursitis, right hip: Secondary | ICD-10-CM

## 2012-12-11 MED ORDER — HALOBETASOL PROPIONATE 0.05 % EX CREA: TOPICAL_CREAM | Freq: Two times a day (BID) | CUTANEOUS | Status: DC

## 2012-12-11 MED ORDER — DICLOFENAC SODIUM 75 MG PO TBEC: 75.0000 mg | DELAYED_RELEASE_TABLET | Freq: Two times a day (BID) | ORAL | Status: DC

## 2012-12-11 NOTE — Progress Notes (Signed)
  Subjective:    Patient ID: [name] [name], female    DOB: May 07, 1966, 47 y.o.   MRN: [id number]  HPI Here for several weeks of pain in the right hip and lateral thigh. No hx of trauma. She does yoga once a week and this helps her lower back feel better. Ice and Advil help the hip.    Review of Systems  Constitutional: Negative.   Musculoskeletal: Positive for arthralgias. Negative for back pain.       Objective:   Physical Exam  Constitutional: She appears well-developed and well-nourished.  Musculoskeletal:  Tender over the right lateral thigh, full ROM           Assessment & Plan:  Rest, ice, and Diclofenac. Recheck prn

## 2012-12-31 ENCOUNTER — Telehealth: Payer: Self-pay | Admitting: Family Medicine

## 2012-12-31 NOTE — Telephone Encounter (Signed)
Patient Information:  Caller Name: Marijean  Phone: [phone]  Patient: [name] [name], [name] [name]  Gender: Female  DOB: 1965-09-25  Age: 47 Years  PCP: Gershon Crane [facility])  Pregnant: No  Office Follow Up:  Does the office need to follow up with this patient?: No  Instructions For The Office: N/A   Symptoms  Reason For Call & Symptoms: Seen 12/11/12 by Dr Lysle Morales for hip pain, diagnosed with bursistis, prescribed Voltaren.  Pain began getting better after 1 week.  Went off med x 1 week.   12/29/12 pain started slowly returning, started back on Voltaren.  12/30/12 pain in right hip is worse than previously, tender and extends into right buttock, rates 5/10, radiates down into thigh.  Treated with ice and rest.  Reviewed Health History In EMR: Yes  Reviewed Medications In EMR: Yes  Reviewed Allergies In EMR: Yes  Reviewed Surgeries / Procedures: Yes  Date of Onset of Symptoms: 12/29/2012  Treatments Tried: Ice Rest Voltaren  Treatments Tried Worked: No OB / GYN:  LMP: 12/10/2012  Guideline(s) Used:  Back Pain  Disposition Per Guideline:   See Today or Tomorrow in Office  Reason For Disposition Reached:   Pain radiates into the thigh or further down the leg  Advice Given:  Cold or Heat:  Cold Pack: For pain or swelling, use a cold pack or ice wrapped in a wet cloth. Put it on the sore area for 20 minutes. Repeat 4 times on the first day, then as needed.  Heat Pack: If pain lasts over 2 days, apply heat to the sore area. Use a heat pack, heating pad, or warm wet washcloth. Do this for 10 minutes, then as needed. For widespread stiffness, take a hot bath or hot shower instead. Move the sore area under the warm water.  Sleep:  Sleep on your side with a pillow between your knees. If you sleep on your back, put a pillow under your knees.  Avoid sleeping on your stomach.  Your mattress should be firm. Avoid waterbeds.  Activity  Keep doing your day-to-day activities if it is not too  painful. Staying active is better than resting.  Avoid anything that makes your pain worse. Avoid heavy lifting, twisting, and too much exercise until your back heals.  You do not need to stay in bed.  Call Back If:  Numbness or weakness occur  Bowel/bladder problems occur  You become worse.  Patient Will Follow Care Advice:  YES  Appointment Scheduled:  01/01/2013 09:30:00 Appointment Scheduled Provider:  Gershon Crane [facility])

## 2013-01-01 ENCOUNTER — Ambulatory Visit (INDEPENDENT_AMBULATORY_CARE_PROVIDER_SITE_OTHER): Payer: BC Managed Care – PPO | Admitting: Family Medicine

## 2013-01-01 ENCOUNTER — Encounter: Payer: Self-pay | Admitting: Family Medicine

## 2013-01-01 VITALS — BP 110/66 | HR 80 | Temp 98.3°F | Wt 154.0 lb

## 2013-01-01 DIAGNOSIS — M7061 Trochanteric bursitis, right hip: Secondary | ICD-10-CM

## 2013-01-01 DIAGNOSIS — M76899 Other specified enthesopathies of unspecified lower limb, excluding foot: Secondary | ICD-10-CM

## 2013-01-01 NOTE — Progress Notes (Signed)
  Subjective:    Patient ID: [name] [name], female    DOB: 10-08-1965, 47 y.o.   MRN: [id number]  HPI Here for a recurrence of right hip and thigh pain. She was here a few weeks ago and was diagnosed with trochanteric bursitis. She took Diclofenac for 10 days, and it worked well. The pain completely resolved. So she stopped the medication. Then several days ago she bathed both of her dogs which required stooping over for an hour or so, and the pain returned. Now it feels like it did the first time.    Review of Systems  Constitutional: Negative.   Musculoskeletal: Positive for myalgias.       Objective:   Physical Exam  Constitutional: She appears well-developed and well-nourished. No distress.  Musculoskeletal:  Tender over the right greater trochanter, no swelling          Assessment & Plan:  She responded well to the Diclofenac but she stopped it too soon. She will start back on it again but this time take it a full 30 days. At that time she can stop it if she feels well

## 2013-01-13 ENCOUNTER — Telehealth: Payer: Self-pay | Admitting: Family Medicine

## 2013-01-13 ENCOUNTER — Ambulatory Visit (INDEPENDENT_AMBULATORY_CARE_PROVIDER_SITE_OTHER): Payer: BC Managed Care – PPO | Admitting: Internal Medicine

## 2013-01-13 ENCOUNTER — Encounter: Payer: Self-pay | Admitting: Internal Medicine

## 2013-01-13 VITALS — BP 120/76 | HR 88 | Temp 98.5°F | Wt 156.0 lb

## 2013-01-13 DIAGNOSIS — S61219A Laceration without foreign body of unspecified finger without damage to nail, initial encounter: Secondary | ICD-10-CM

## 2013-01-13 DIAGNOSIS — Z23 Encounter for immunization: Secondary | ICD-10-CM

## 2013-01-13 DIAGNOSIS — S61209A Unspecified open wound of unspecified finger without damage to nail, initial encounter: Secondary | ICD-10-CM

## 2013-01-13 NOTE — Progress Notes (Signed)
Chief Complaint  Patient presents with  . Finger Wound    HPI: Patient comes in today for SDA for  new problem evaluation. PCP NA  Was sewing this am and needle punctrue finger and at nait got piece out and was intact ( had hx of needle broken in finger and hag to be removed by hand surgeon)  stop the bleeding so called: Nurse comes in today to check. She thinks her last tetanus shot on checking was in 2007. The finger hurts but not terribly so little tingling no active bleeding. ROS: See pertinent positives and negatives per HPI.  Past Medical History  Diagnosis Date  . Allergic rhinitis   . Asthma   . Depression     sees [facility] and Dr. Nolen Mu  . GERD (gastroesophageal reflux disease)   . UTI (urinary tract infection)   . Anxiety disorder     Family History  Problem Relation Age of Onset  . Alcohol abuse    . Arthritis    . Breast cancer    . Coronary artery disease    . Hypertension    . Rheum arthritis Mother   . Crohn's disease Sister   . Heart attack Paternal Grandfather     History   Social History  . Marital Status: Married    Spouse Name: N/A    Number of Children: 2  . Years of Education: N/A   Social History Main Topics  . Smoking status: Never Smoker   . Smokeless tobacco: Never Used  . Alcohol Use: No  . Drug Use: No  . Sexually Active: None   Other Topics Concern  . None   Social History Narrative   Stay-at-home mom   Daily caffeine use 2          Outpatient Encounter Prescriptions as of 01/13/2013  Medication Sig Dispense Refill  . albuterol (PROAIR HFA) 108 (90 BASE) MCG/ACT inhaler Inhale 2 puffs into the lungs every 4 (four) hours as needed.  1 Inhaler  6  . budesonide-formoterol (SYMBICORT) 160-4.5 MCG/ACT inhaler 1 puff two times a day brush, rinse and gargle  1 Inhaler  0  . calcium elemental as carbonate (TUMS ULTRA 1000) 400 MG tablet As needed with meals      . cetirizine-pseudoephedrine (ZYRTEC-D) 5-120 MG per tablet  Take 1 tablet by mouth daily.        . diclofenac (VOLTAREN) 75 MG EC tablet Take 1 tablet (75 mg total) by mouth 2 (two) times daily.  60 tablet  2  . guaiFENesin (MUCINEX) 600 MG 12 hr tablet as needed.       . halobetasol (ULTRAVATE) 0.05 % cream Apply topically 2 (two) times daily.  50 g  5   No facility-administered encounter medications on file as of 01/13/2013.    EXAM:  BP 120/76  Pulse 88  Temp(Src) 98.5 F (36.9 C) (Oral)  Wt 156 lb (70.761 kg)  BMI 28.53 kg/m2  SpO2 98%  LMP 01/03/2013  Body mass index is 28.53 kg/(m^2).  GENERAL: vitals reviewed and listed above, alert, oriented, appears well hydrated and in no acute distress Examination of the left middle finger shows clean shortening L. with a superficial avulsion irregular laceration and a 3 mm superficial skin laceration on the tip. The distal nail also has a laceration but not with blood in the nail base. Your vascular intact no foreign body seen normal capillary refill. Range of motion normal rest of hand normal PSYCH: pleasant and cooperative,  ASSESSMENT AND PLAN:  Discussed the following assessment and plan:  Laceration of finger, initial encounter - Sewing needle puncture and superficial avulsion of skin and nail  Need for prophylactic vaccination with combined diphtheria-tetanus-pertussis (DTP) vaccine - Plan: Tdap vaccine greater than or equal to 7yo IM Don't think there is anything to suture the bleeding is controlled cleaned with sterile saline soak antibiotic ointment covered with Coban and a nonstick dressing. To keep clean antibiotic ointment covered elevation expect more pain tomorrow we discussed getting an x-ray to rule out foreign body but she is 100% sure that the needle was intact however if having some type of continued pain is atypical we will order x-ray she will call us for that. Told her look for signs of infection and recheck we can recheck the week at a time I expect it will take a week or so  to heal has some risk of infection because of the shape of the wound. Updated her tetanus shot today. -Patient advised to return or notify health care team  if symptoms worsen or persist or new concerns arise.  Patient Instructions  This wound is not part puncture and part crush .    And will need to heal on its own . If any concern we can do an x ray of the finger tip  .   Keep clean gently clean with sterile water  Saline in the short run.   Antibiotic  Topical   And keep covered .  Elevated tonight     Avoid harsh chemicals and peroxide .  This can damage the healing skin.  Expect healing with in a week or so . contact us if increasing pain .  Redness infection signs .   We updated tdap today .  Contact us and we can do a xray at any time.      Neta Mends. [name] [name] M.D.

## 2013-01-13 NOTE — Patient Instructions (Signed)
This wound is not part puncture and part crush .    And will need to heal on its own . If any concern we can do an x ray of the finger tip  .   Keep clean gently clean with sterile water  Saline in the short run.   Antibiotic  Topical   And keep covered .  Elevated tonight     Avoid harsh chemicals and peroxide .  This can damage the healing skin.  Expect healing with in a week or so . contact us if increasing pain .  Redness infection signs .   We updated tdap today .  Contact us and we can do a xray at any time.

## 2013-01-13 NOTE — Telephone Encounter (Signed)
Patient Information:  Caller Name: Kaysey  Phone: [phone]  Patient: [name] [name], [name] [name]  Gender: Female  DOB: 08/06/1965  Age: 47 Years  PCP: Gershon Crane [facility])  Pregnant: No  Office Follow Up:  Does the office need to follow up with this patient?: No  Instructions For The Office: N/A  RN Note:  Pt accidently got Left middle Finger caught in sewing machine needle while running, needle broke, Pt denies any foreign body in finger.  Bleeding is controlled. Tetnus uncertain. Dr Clent Ridges not available per [facility], appt scheduled w/ Dr Fabian Sharp at 1600 on 7-9.   Symptoms  Reason For Call & Symptoms: Puncture Wound  Reviewed Health History In EMR: Yes  Reviewed Medications In EMR: Yes  Reviewed Allergies In EMR: Yes  Reviewed Surgeries / Procedures: Yes  Date of Onset of Symptoms: 01/13/2013  Treatments Tried: pressure applied  Treatments Tried Worked: No OB / GYN:  LMP: Unknown  Guideline(s) Used:  Puncture Wound  Disposition Per Guideline:   See Today in Office  Reason For Disposition Reached:   Patient wants to be seen  Advice Given:  N/A  Patient Will Follow Care Advice:  YES  Appointment Scheduled:  01/13/2013 16:00:00 Appointment Scheduled Provider:  Berniece Andreas (Family Practice)

## 2013-04-01 ENCOUNTER — Ambulatory Visit (INDEPENDENT_AMBULATORY_CARE_PROVIDER_SITE_OTHER): Payer: BC Managed Care – PPO | Admitting: Family Medicine

## 2013-04-01 ENCOUNTER — Encounter: Payer: Self-pay | Admitting: Family Medicine

## 2013-04-01 VITALS — BP 120/70 | HR 102 | Temp 98.5°F | Wt 155.0 lb

## 2013-04-01 DIAGNOSIS — M7631 Iliotibial band syndrome, right leg: Secondary | ICD-10-CM

## 2013-04-01 DIAGNOSIS — L0390 Cellulitis, unspecified: Secondary | ICD-10-CM

## 2013-04-01 DIAGNOSIS — M629 Disorder of muscle, unspecified: Secondary | ICD-10-CM

## 2013-04-01 DIAGNOSIS — L0291 Cutaneous abscess, unspecified: Secondary | ICD-10-CM

## 2013-04-01 MED ORDER — CEPHALEXIN 500 MG PO CAPS: 500.0000 mg | ORAL_CAPSULE | Freq: Three times a day (TID) | ORAL | Status: AC

## 2013-04-01 MED ORDER — AMMONIUM LACTATE 12 % EX LOTN: TOPICAL_LOTION | Freq: Two times a day (BID) | CUTANEOUS | Status: DC

## 2013-04-01 NOTE — Progress Notes (Signed)
  Subjective:    Patient ID: [name] [name], female    DOB: 1966/01/10, 47 y.o.   MRN: [id number]  HPI Here for 2 things. First she has had thick skin on the soles of her feet for years and this cracks open and gets tender at times. Now for the past week the right heel has been red and tender. Also she had had pain along the right lateral thigh straing in the lower back and radiating down the leg to just below the knee. Diclofenac helps. She still does yoga several times a week.    Review of Systems  Constitutional: Negative.   Musculoskeletal: Positive for myalgias.  Skin: Positive for wound.       Objective:   Physical Exam  Constitutional: She appears well-developed and well-nourished.  Musculoskeletal:  Tender along the entire lateral right thigh. Full ROM of the hip and knee  Skin:  The soles of both feet, particularly the heels, are thickened and cracked open. The right heel is pink and tender, not swollen           Assessment & Plan:  She has iliotibial band syndrome so we will refer her to PT for this. Use Lac-Hydrin to thin the skin on the feet and use Keflex for early cellulitis

## 2013-04-05 ENCOUNTER — Ambulatory Visit: Payer: BC Managed Care – PPO | Attending: Family Medicine

## 2013-04-05 DIAGNOSIS — R5381 Other malaise: Secondary | ICD-10-CM | POA: Insufficient documentation

## 2013-04-05 DIAGNOSIS — IMO0001 Reserved for inherently not codable concepts without codable children: Secondary | ICD-10-CM | POA: Insufficient documentation

## 2013-04-05 DIAGNOSIS — M25569 Pain in unspecified knee: Secondary | ICD-10-CM | POA: Insufficient documentation

## 2013-04-07 ENCOUNTER — Ambulatory Visit: Payer: BC Managed Care – PPO | Attending: Family Medicine

## 2013-04-07 DIAGNOSIS — M25569 Pain in unspecified knee: Secondary | ICD-10-CM | POA: Insufficient documentation

## 2013-04-07 DIAGNOSIS — IMO0001 Reserved for inherently not codable concepts without codable children: Secondary | ICD-10-CM | POA: Insufficient documentation

## 2013-04-07 DIAGNOSIS — R5381 Other malaise: Secondary | ICD-10-CM | POA: Insufficient documentation

## 2013-05-06 ENCOUNTER — Telehealth: Payer: Self-pay | Admitting: Family Medicine

## 2013-05-06 ENCOUNTER — Ambulatory Visit (INDEPENDENT_AMBULATORY_CARE_PROVIDER_SITE_OTHER): Payer: BC Managed Care – PPO | Admitting: Family

## 2013-05-06 ENCOUNTER — Encounter: Payer: Self-pay | Admitting: Family

## 2013-05-06 VITALS — BP 126/90 | HR 110 | Wt 158.0 lb

## 2013-05-06 DIAGNOSIS — M79609 Pain in unspecified limb: Secondary | ICD-10-CM

## 2013-05-06 DIAGNOSIS — M79605 Pain in left leg: Secondary | ICD-10-CM

## 2013-05-06 LAB — CBC WITH DIFFERENTIAL/PLATELET
Basophils Absolute: 0.1 10*3/uL (ref 0.0–0.1)
Basophils Relative: 1.1 % (ref 0.0–3.0)
Eosinophils Absolute: 0.6 10*3/uL (ref 0.0–0.7)
Eosinophils Relative: 7.7 % — ABNORMAL HIGH (ref 0.0–5.0)
HCT: 39.9 % (ref 36.0–46.0)
Hemoglobin: 13.5 g/dL (ref 12.0–15.0)
Lymphocytes Relative: 29.2 % (ref 12.0–46.0)
Lymphs Abs: 2.3 10*3/uL (ref 0.7–4.0)
MCHC: 33.8 g/dL (ref 30.0–36.0)
MCV: 90.5 fl (ref 78.0–100.0)
Monocytes Absolute: 0.7 10*3/uL (ref 0.1–1.0)
Monocytes Relative: 9.2 % (ref 3.0–12.0)
Neutro Abs: 4.1 10*3/uL (ref 1.4–7.7)
Neutrophils Relative %: 52.8 % (ref 43.0–77.0)
Platelets: 294 10*3/uL (ref 150.0–400.0)
RBC: 4.41 Mil/uL (ref 3.87–5.11)
RDW: 12.3 % (ref 11.5–14.6)
WBC: 7.7 10*3/uL (ref 4.5–10.5)

## 2013-05-06 LAB — D-DIMER, QUANTITATIVE (NOT AT ARMC): D-Dimer, Quant: 0.3 ug/mL-FEU (ref 0.00–0.48)

## 2013-05-06 NOTE — Telephone Encounter (Signed)
Pt would like blood work results. Pt saw NP °

## 2013-05-06 NOTE — Patient Instructions (Signed)
1. We will consider an US of the leg if d dimer is positive.

## 2013-05-06 NOTE — Progress Notes (Signed)
Subjective:    Patient ID: [name] [name], female    DOB: 1966-01-22, 47 y.o.   MRN: [id number]  HPI  47 year old white female, patient of Dr. Clent Ridges is in today with left lower leg pain x2 weeks. The pain is worse when she is on her feet when she crosses her legs. Denies any redness or swelling. Describes it as a dull, deep ache. She has not taken any medication for relief. Denies any known injury. She does do yoga once a week. She has also been dealing with a bursitis of the right hip.  Review of Systems  Constitutional: Negative.   Respiratory: Negative.   Cardiovascular: Negative.   Genitourinary: Negative.   Musculoskeletal: Positive for myalgias.       Left leg pain  Skin: Negative.   Neurological: Negative.   Psychiatric/Behavioral: Negative.        Past Medical History  Diagnosis Date  . Allergic rhinitis   . Asthma   . Depression     sees [facility] and Dr. Nolen Mu  . GERD (gastroesophageal reflux disease)   . UTI (urinary tract infection)   . Anxiety disorder     History   Social History  . Marital Status: Married    Spouse Name: N/A    Number of Children: 2  . Years of Education: N/A   Occupational History  . Not on file.   Social History Main Topics  . Smoking status: Never Smoker   . Smokeless tobacco: Never Used  . Alcohol Use: No  . Drug Use: No  . Sexual Activity: Not on file   Other Topics Concern  . Not on file   Social History Narrative   Stay-at-home mom   Daily caffeine use 2          Past Surgical History  Procedure Laterality Date  . Nissen fundoplication  07/2005    Dr. Daphine Deutscher  . Ventral hernia repair  1/09    Dr. Daphine Deutscher  . Cesarean section  2001  . Cesarean section  2003  . Cholecystectomy    . Tonsillectomy    . Tubal ligation      Family History  Problem Relation Age of Onset  . Alcohol abuse    . Arthritis    . Breast cancer    . Coronary artery disease    . Hypertension    . Rheum arthritis Mother   .  Crohn's disease Sister   . Heart attack Paternal Grandfather     Allergies  Allergen Reactions  . Penicillins     REACTION: childhood - hives/rash  . Sulfonamide Derivatives     REACTION: childhood - hives/rash    Current Outpatient Prescriptions on File Prior to Visit  Medication Sig Dispense Refill  . albuterol (PROAIR HFA) 108 (90 BASE) MCG/ACT inhaler Inhale 2 puffs into the lungs every 4 (four) hours as needed.  1 Inhaler  6  . ammonium lactate (LAC-HYDRIN) 12 % lotion Apply topically 2 (two) times daily.  400 g  5  . budesonide-formoterol (SYMBICORT) 160-4.5 MCG/ACT inhaler 1 puff two times a day brush, rinse and gargle  1 Inhaler  0  . calcium elemental as carbonate (TUMS ULTRA 1000) 400 MG tablet As needed with meals      . cetirizine-pseudoephedrine (ZYRTEC-D) 5-120 MG per tablet Take 1 tablet by mouth daily.        Marland Kitchen guaiFENesin (MUCINEX) 600 MG 12 hr tablet as needed.       . halobetasol (  ULTRAVATE) 0.05 % cream Apply topically 2 (two) times daily.  50 g  5  . diclofenac (VOLTAREN) 75 MG EC tablet Take 1 tablet (75 mg total) by mouth 2 (two) times daily.  60 tablet  2   No current facility-administered medications on file prior to visit.    BP 126/90  Pulse 110  Wt 158 lb (71.668 kg)  BMI 28.89 kg/m2chart Objective:   Physical Exam  Constitutional: She is oriented to person, place, and time. She appears well-developed and well-nourished.  Neck: Normal range of motion. Neck supple.  Cardiovascular: Normal rate, regular rhythm and normal heart sounds.   Pulmonary/Chest: Effort normal and breath sounds normal.  Musculoskeletal: She exhibits tenderness. She exhibits no edema.  Left lower leg pain to palpation. No redness. No swelling.   Neurological: She is alert and oriented to person, place, and time.  Skin: Skin is warm and dry.  Psychiatric: She has a normal mood and affect.          Assessment & Plan:  Assessment: 1. Left lower leg pain  Plan: D-dimer  sent today. She does not have any redness or swelling, therefore this is likely a muscle strain. However, d-dimer is reactive we will order an ultrasound of the leg. On the office with any questions or concerns. Recheck as discussed and sooner as needed.

## 2013-05-07 NOTE — Telephone Encounter (Signed)
Pt aware of results 

## 2013-06-15 ENCOUNTER — Other Ambulatory Visit: Payer: Self-pay

## 2013-06-15 DIAGNOSIS — Z1231 Encounter for screening mammogram for malignant neoplasm of breast: Secondary | ICD-10-CM

## 2013-06-29 ENCOUNTER — Ambulatory Visit: Payer: BC Managed Care – PPO | Admitting: Pulmonary Disease

## 2013-07-05 ENCOUNTER — Telehealth: Payer: Self-pay | Admitting: Pulmonary Disease

## 2013-07-05 ENCOUNTER — Other Ambulatory Visit: Payer: Self-pay | Admitting: Pulmonary Disease

## 2013-07-05 MED ORDER — AZITHROMYCIN 250 MG PO TABS: ORAL_TABLET | ORAL | Status: DC

## 2013-07-05 MED ORDER — FLUTICASONE-SALMETEROL 250-50 MCG/DOSE IN AEPB: 1.0000 | INHALATION_SPRAY | Freq: Two times a day (BID) | RESPIRATORY_TRACT | Status: DC

## 2013-07-05 NOTE — Telephone Encounter (Signed)
zpak O to go back on advair 250

## 2013-07-05 NOTE — Telephone Encounter (Signed)
I spoke with pt. She c/o nasal congestion, PND, cough w/ pale yellow phlem, chest tx, hoarseness x 2 weeks. Denies any wheezing, no fever, no chills, no sweats. She has been taking daily mucinex, zyrtec d and doing sinus rinses. She is feeling better than before. She is asking if something can be called in since no available openings in office.   Also pt insurance will no longer cover symbicort. Pt must first try and fail advair and dulera. Pt has already been on adviar in the past. Please advise RA thanks  Allergies  Allergen Reactions  . Penicillins     REACTION: childhood - hives/rash  . Sulfonamide Derivatives     REACTION: childhood - hives/rash

## 2013-07-05 NOTE — Telephone Encounter (Signed)
Pt aware of recs. rx sent. Nothing further needed 

## 2013-07-06 ENCOUNTER — Other Ambulatory Visit: Payer: Self-pay | Admitting: Pulmonary Disease

## 2013-07-06 ENCOUNTER — Telehealth: Payer: Self-pay | Admitting: Pulmonary Disease

## 2013-07-06 NOTE — Telephone Encounter (Signed)
Let her know advair equivalent to symbicort in my opinion We have not seen her since feb 2014 Otherwise,Pl arrange OV with TP to discuss history for such letter to be written

## 2013-07-06 NOTE — Telephone Encounter (Signed)
Pt states the number for [facility] [employer] is [phone] to ask for a prior authorization review (medical necessity form) or [phone] & fax# [phone].  Pt would like to get a sample of symbicort-would like to p/u tomorrow-pt can be reached at [phone].  Antionette Fairy

## 2013-07-06 NOTE — Telephone Encounter (Signed)
I called # provided. Was advised that Dr. Vassie Loll will need to write a letter stating the medications pt has tried/failed, the year these medications were giving, reason they did not work, reason pt will need symbicort. Please advise Dr. Vassie Loll thanks

## 2013-07-06 NOTE — Telephone Encounter (Signed)
I spoke with pt. She reports her insurance advised her if we can send in a form for medical necessity for her to stay on the symbicort. Pt does not want to switch back to advair since she has been doing very well on the symbicort. She does not want to take the chance of changing her medications. She ahs a # I need to call to get form sent to [place]. She will call us back with the number

## 2013-07-07 NOTE — Telephone Encounter (Signed)
lmomtcb x1 for pt 

## 2013-07-07 NOTE — Telephone Encounter (Signed)
Pt advised and appt set with TP on 07-21-13. Carron Curie, CMA

## 2013-07-20 ENCOUNTER — Ambulatory Visit
Admission: RE | Admit: 2013-07-20 | Discharge: 2013-07-20 | Disposition: A | Discharge status: Home / Self Care | Payer: BC Managed Care – PPO | Source: Ambulatory Visit

## 2013-07-20 DIAGNOSIS — Z1231 Encounter for screening mammogram for malignant neoplasm of breast: Secondary | ICD-10-CM

## 2013-07-21 ENCOUNTER — Encounter: Payer: Self-pay | Admitting: Adult Health

## 2013-07-21 ENCOUNTER — Ambulatory Visit (INDEPENDENT_AMBULATORY_CARE_PROVIDER_SITE_OTHER): Payer: BC Managed Care – PPO | Admitting: Adult Health

## 2013-07-21 VITALS — BP 104/66 | HR 92 | Temp 97.4°F | Ht 63.0 in | Wt 163.0 lb

## 2013-07-21 DIAGNOSIS — J209 Acute bronchitis, unspecified: Secondary | ICD-10-CM

## 2013-07-21 MED ORDER — PREDNISONE 10 MG PO TABS: ORAL_TABLET | ORAL | Status: DC

## 2013-07-21 MED ORDER — ALBUTEROL SULFATE HFA 108 (90 BASE) MCG/ACT IN AERS: INHALATION_SPRAY | RESPIRATORY_TRACT | Status: DC

## 2013-07-21 NOTE — Patient Instructions (Addendum)
Mucinex DM Twice daily As needed  Cough/congestion  Prednisone taper over next week.  Fluids and rest  Saline nasal rinses  Mucinex Twice daily  As needed  Congestion  Delsym 2 tsp every 12 hr As needed  Cough  Zyrtec D As needed  Drainage  Trial of Dulera 2 puffs Twice daily  (in place of Symbicort due to insurance) -call back if works ok - or we will need to do prior authorization.  Please contact office for sooner follow up if symptoms do not improve or worsen or seek emergency care  follow up Dr. Vassie LollAlva  In 4 months

## 2013-07-21 NOTE — Assessment & Plan Note (Signed)
Flare -slow to resolve  Therapy change due to insurance   Plan  Mucinex DM Twice daily As needed  Cough/congestion  Prednisone taper over next week.  Fluids and rest  Saline nasal rinses  Mucinex Twice daily  As needed  Congestion  Delsym 2 tsp every 12 hr As needed  Cough  Zyrtec D As needed  Drainage  Trial of Dulera 2 puffs Twice daily  (in place of Symbicort due to insurance) -call back if works ok - or we will need to do prior authorization.  Please contact office for sooner follow up if symptoms do not improve or worsen or seek emergency care  follow up Dr. Vassie LollAlva  In 4 months

## 2013-07-21 NOTE — Progress Notes (Signed)
Subjective:    Patient ID: [name] [name], female    DOB: [birth year], 48 y.o.   MRN: [id number]  HPI 48 yo WF, with known history of Asthma since childhood, worse during & after pregnancy 2001.  Triggers - GERD vs allergies  Hx of severe reflux for many yrs, improved after Nissen's 2008.  9/09 >> spirometry nml, FEv1 99%  Breakthrough reflux 4-5/wk, takes tums every night with ranitidine; reglan did not work (dysmotility noted on studies by Dr. Daphine Deutschermartin post Nissen's in 2008).  2 exacerbations in 2010 requiring steroids.   Nov 06, 2009 - stopped zoloft/wellbutrin and lost 30lbs. GERD under good control - off omeprazole, on tums twice/ month.    06/22/12 5721m FU Compliant with symbicort  - taking 2 puffs bid No nocturnal symps c/o cough w/ phlem (not sure what color), sinus congestion, PND, feels tired. was giving ZPAK 2 weeks ago by Dr. Clent RidgesFry. when she takes the symbicort in the mornings she is getting tight by the evening time  08/14/12  Acute OV  Complains of increased SOB, wheezing, prod cough with white mucus, wheezing, some tightness x 2 day - denies f/c/s.  has been treating with mucinex Patient denies any hemoptysis, orthopnea, PND, or leg swelling. Over-the-counter cold medicines are not helping with her symptoms. >>zpack   07/21/2013 Acute OV  Returns to discuss Symbicort.  Insurance will no longer cover. Formulary will cover Advair or dulera. She did not do as well on Advair. We discussed trial of Dulera. She does not want to change as she has done better on Symbicort.  Will try Dulera if not working will need to try prior authorization with  incsurance co. Does report some chest congestion, hoarseness, "pull" in her chest x3 weeks - zpak on 12/29 did help, but symptoms are lingering.w/ dry cough and wheezing.  No fever, chest pain, orthopnea or edema [place]      .Review of Systems  Constitutional:   No  weight loss, night sweats,  Fevers, chills,  fatigue, or  lassitude.  HEENT:   No headaches,  Difficulty swallowing,  Tooth/dental problems, or  Sore throat,                No sneezing, itching, ear ache,  +nasal congestion, post nasal drip,   CV:  No chest pain,  Orthopnea, PND, swelling in lower extremities, anasarca, dizziness, palpitations, syncope.   GI  No heartburn, indigestion, abdominal pain, nausea, vomiting, diarrhea, change in bowel habits, loss of appetite, bloody stools.   Resp:    No coughing up of blood.   Marland Kitchen.  No chest wall deformity  Skin: no rash or lesions.  GU: no dysuria, change in color of urine, no urgency or frequency.  No flank pain, no hematuria   MS:  No joint pain or swelling.  No decreased range of motion.  No back pain.  Psych:  No change in mood or affect. No depression or anxiety.  No memory loss.         Objective:   Physical Exam   Gen. Pleasant, well-nourished, in no distress ENT - no lesions, no post nasal drip Neck: No JVD, no thyromegaly, no carotid bruits Lungs: no use of accessory muscles, no dullness to percussion decreased BS in bases  Cardiovascular: Rhythm regular, heart sounds  normal, no murmurs or gallops, no peripheral edema Musculoskeletal: No deformities, no cyanosis or clubbing        Assessment & Plan:

## 2013-07-26 ENCOUNTER — Telehealth: Payer: Self-pay | Admitting: Adult Health

## 2013-07-26 MED ORDER — AZITHROMYCIN 250 MG PO TABS
ORAL_TABLET | ORAL | Status: AC
Start: 2013-07-26 — End: 2013-07-31

## 2013-07-26 NOTE — Telephone Encounter (Signed)
Zpack take as directed #1  Mucinex DM Twice daily  As needed  Cough/congestion  Fluids and rest  Please contact office for sooner follow up if symptoms do not improve or worsen or seek emergency care

## 2013-07-26 NOTE — Telephone Encounter (Signed)
Rx has been sent in. Pt is aware of TP's recs.

## 2013-07-26 NOTE — Telephone Encounter (Signed)
Spoke with pt. States that she was told to call if she wasn't feeling any better and TP would call something in for her. Reports congestion, cough and severe PND. Has been taking Mucinex with no relief. When she does get mucus up it's thick and white in color.  TP - please advise. Thanks.

## 2013-08-03 ENCOUNTER — Ambulatory Visit (INDEPENDENT_AMBULATORY_CARE_PROVIDER_SITE_OTHER)
Admission: RE | Admit: 2013-08-03 | Discharge: 2013-08-03 | Disposition: A | Discharge status: Home / Self Care | Payer: BC Managed Care – PPO | Source: Ambulatory Visit | Attending: Adult Health | Admitting: Adult Health

## 2013-08-03 ENCOUNTER — Encounter: Payer: Self-pay | Admitting: Family Medicine

## 2013-08-03 ENCOUNTER — Ambulatory Visit (INDEPENDENT_AMBULATORY_CARE_PROVIDER_SITE_OTHER): Payer: BC Managed Care – PPO | Admitting: Family Medicine

## 2013-08-03 ENCOUNTER — Telehealth: Payer: Self-pay | Admitting: Pulmonary Disease

## 2013-08-03 VITALS — BP 118/80 | HR 88 | Temp 98.4°F | Wt 162.0 lb

## 2013-08-03 DIAGNOSIS — R52 Pain, unspecified: Secondary | ICD-10-CM

## 2013-08-03 DIAGNOSIS — J189 Pneumonia, unspecified organism: Secondary | ICD-10-CM

## 2013-08-03 DIAGNOSIS — R05 Cough: Secondary | ICD-10-CM

## 2013-08-03 DIAGNOSIS — J45909 Unspecified asthma, uncomplicated: Secondary | ICD-10-CM

## 2013-08-03 DIAGNOSIS — J309 Allergic rhinitis, unspecified: Secondary | ICD-10-CM

## 2013-08-03 DIAGNOSIS — J209 Acute bronchitis, unspecified: Secondary | ICD-10-CM

## 2013-08-03 DIAGNOSIS — R059 Cough, unspecified: Secondary | ICD-10-CM

## 2013-08-03 LAB — POCT INFLUENZA A/B
Influenza A, POC: POSITIVE
Influenza B, POC: POSITIVE

## 2013-08-03 NOTE — Progress Notes (Signed)
Pre visit review using our clinic review tool, if applicable. No additional management support is needed unless otherwise documented below in the visit note. 

## 2013-08-03 NOTE — Progress Notes (Signed)
Chief Complaint  Patient presents with  . Cough    congestion, heaviness in chest, body aches     HPI:  -started: for several weeks saw her pulmonologist for this and this got better with prednisone and zpak, but then had recurrence of symptoms 3-4 days ago and started her abx and just finished it -symptoms: cough, nasal congestion, wheezing, mild SOB - symbicort helps, muscle hurt from coughing -denies:fever, NVD, tooth pain, sinus pain -has tried: musinex, using alb prn -sick contacts/travel/risks: she was briefly around a student with flu and wants testing for this -Hx of: allergies ROS: See pertinent positives and negatives per HPI.  Past Medical History  Diagnosis Date  . Allergic rhinitis   . Asthma   . Depression     [facility] Nolen MuMcKinney  . GERD (gastroesophageal reflux disease)   . UTI (urinary tract infection)   . Anxiety disorder     Past Surgical History  Procedure Laterality Date  . Nissen fundoplication  07/2005    Dr. Daphine DeutscherMartin  . Ventral hernia repair  1/09    Dr. Daphine DeutscherMartin  . Cesarean section  2001  . Cesarean section  2003  . Cholecystectomy    . Tonsillectomy    . Tubal ligation      Family History  Problem Relation Age of Onset  . Alcohol abuse    . Arthritis    . Breast cancer    . Coronary artery disease    . Hypertension    . Rheum arthritis Mother   . Crohn's disease Sister   . Heart attack Paternal Grandfather     History   Social History  . Marital Status: Married    Spouse Name: N/A    Number of Children: 2  . Years of Education: N/A   Social History Main Topics  . Smoking status: Never Smoker   . Smokeless tobacco: Never Used  . Alcohol Use: No  . Drug Use: No  . Sexual Activity: None   Other Topics Concern  . None   Social History Narrative   Stay-at-home mom   Daily caffeine use 2          Current outpatient prescriptions:albuterol (PROAIR HFA) 108 (90 BASE) MCG/ACT inhaler, Inhale two puffs by mouth  every four hours as needed, Disp: 8.5 g, Rfl: 5;  ammonium lactate (LAC-HYDRIN) 12 % lotion, Apply topically 2 (two) times daily., Disp: 400 g, Rfl: 5;  calcium elemental as carbonate (TUMS ULTRA 1000) 400 MG tablet, As needed with meals, Disp: , Rfl:  cetirizine-pseudoephedrine (ZYRTEC-D) 5-120 MG per tablet, Take 1 tablet by mouth daily.  , Disp: , Rfl: ;  Fluticasone-Salmeterol (ADVAIR DISKUS) 250-50 MCG/DOSE AEPB, Inhale 1 puff into the lungs 2 (two) times daily., Disp: 60 each, Rfl: 5;  guaiFENesin (MUCINEX) 600 MG 12 hr tablet, Take 600 mg by mouth 2 (two) times daily as needed. , Disp: , Rfl: ;  halobetasol (ULTRAVATE) 0.05 % cream, Apply topically 2 (two) times daily., Disp: 50 g, Rfl: 5 predniSONE (DELTASONE) 10 MG tablet, 4 tabs for 2 days, then 3 tabs for 2 days, 2 tabs for 2 days, then 1 tab for 2 days, then stop, Disp: 20 tablet, Rfl: 0  EXAM:  Filed Vitals:   08/03/13 0904  BP: 118/80  Pulse: 88  Temp: 98.4 F (36.9 C)    Body mass index is 28.7 kg/(m^2).  GENERAL: vitals reviewed and listed above, alert, oriented, appears well hydrated and in no acute  distress  HEENT: atraumatic, conjunttiva clear, no obvious abnormalities on inspection of external nose and ears, normal appearance of ear canals and TMs, clear nasal congestion, mild post oropharyngeal erythema with PND, no tonsillar edema or exudate, no sinus TTP  NECK: no obvious masses on inspection  LUNGS: clear to auscultation bilaterally, no wheezes, rales or rhonchi, good air movement  CV: HRRR, no peripheral edema  MS: moves all extremities without noticeable abnormality  PSYCH: pleasant and cooperative, no obvious depression or anxiety  ASSESSMENT AND PLAN:  Discussed the following assessment and plan:  Body aches - Plan: POC Influenza A/B  ASTHMATIC BRONCHITIS, ACUTE  ALLERGIC RHINITIS  ASTHMA  -no resp distress, normal lung exam, normal O2 -her asthma and current illness are being managed by pulm and  she prefers not to do another course of prednisone or abx prior to discussion with them and will call them for recs -here today primarily to test for flu - discussed would not likely benefit from tamiflu given 4-5 days out from onset of symptoms but she would like to test just to know to avoid spreading to others  -rapid flu neg, offered cough medication but she prefers not to take medicines unless needed -advised to continue current regimen for asthma and allergies unless directed to change per pulmonologist -of course, we advised to return or notify a doctor immediately if symptoms worsen or persist or new concerns arise.    Patient Instructions  -call your pulmonologist regarding persistent symptoms and treatment     Macon Sandiford R.

## 2013-08-03 NOTE — Progress Notes (Signed)
Quick Note:  Called spoke with patient, advised of cxr results / recs as stated by TP. Pt verbalized her understanding and denied any questions. ______ 

## 2013-08-03 NOTE — Telephone Encounter (Signed)
?  are symptoms getting better  Generally take 7-14 days  If symptoms are worse will need cxr t/o r/o PNA otherwise Zpack would cover a uncomplicated PNA  She was too late for Tamiflu usually started in first 24-48hrs so agree not needed.  Asthma also may be flared, ? Better w/ steroids ?? If not improving will be glad to see otherwise cont w/ PCP recs  Please contact office for sooner follow up if symptoms do not improve or worsen or seek emergency care

## 2013-08-03 NOTE — Telephone Encounter (Signed)
Pt is aware of TP's recs. She would like to come in for CXR. Order has been placed, she will try to come by today to do.

## 2013-08-03 NOTE — Patient Instructions (Signed)
-  call your pulmonologist regarding persistent symptoms and treatment

## 2013-08-03 NOTE — Telephone Encounter (Signed)
Pt seen primary md this amd and tested positive for the flu. They did not want to treat pt due to her asthma and because we had treated her recently.  Has had symptoms since Saturday.  C/O body aches, prod cough (white), chest tightness, SOB at night.  Denies fever or wheezing.  Finished Pred taper on Saturday and Zpak this am.  Pt still taking Symbicort bid .  Pt a little concerned about possible pneumonia but cxr was not done today.  Please advise.  RA is off today.

## 2013-08-19 ENCOUNTER — Telehealth: Payer: Self-pay | Admitting: Pulmonary Disease

## 2013-08-19 NOTE — Telephone Encounter (Signed)
Called and spoke with pt. Advised her did not have any samples at this time. Nothing further needed

## 2013-09-01 ENCOUNTER — Telehealth: Payer: Self-pay | Admitting: Pulmonary Disease

## 2013-09-01 NOTE — Telephone Encounter (Signed)
Yes  Can give sample , call in rx

## 2013-09-01 NOTE — Telephone Encounter (Signed)
lmomtcb x1 for pt 

## 2013-09-01 NOTE — Telephone Encounter (Signed)
Per OV 07/21/13: Patient Instructions     Mucinex DM Twice daily As needed Cough/congestion  Prednisone taper over next week.  Fluids and rest  Saline nasal rinses  Mucinex Twice daily As needed Congestion  Delsym 2 tsp every 12 hr As needed Cough  Zyrtec D As needed Drainage  Trial of Dulera 2 puffs Twice daily (in place of Symbicort due to insurance) -call back if works ok - or we will need to do prior authorization.  Please contact office for sooner follow up if symptoms do not improve or worsen or seek emergency care  follow up Dr. Vassie LollAlva In 4 months   --  I called made pt aware we do not have any samples at this time of dulera. Pt never tried the dulera bc we did not have sample in office at her OV and when she calls we do not have any samples. She still taking symbicort daily d/t us not having samples. She wants to know if TP still wants her to try the dulera and call in RX for her. Please advise TP thanks

## 2013-09-03 NOTE — Telephone Encounter (Signed)
lmomtcb x 2  

## 2013-09-06 MED ORDER — MOMETASONE FURO-FORMOTEROL FUM 200-5 MCG/ACT IN AERO: 2.0000 | INHALATION_SPRAY | Freq: Two times a day (BID) | RESPIRATORY_TRACT | Status: DC

## 2013-09-06 NOTE — Telephone Encounter (Signed)
We do not have samples. I have lmtcb to verify pharmacy so Rx can be sent. Carron CurieJennifer Britta Louth, CMA

## 2013-09-06 NOTE — Telephone Encounter (Signed)
Pt retuning call

## 2013-09-06 NOTE — Telephone Encounter (Signed)
Pharmacy verified and rx sent. Carron CurieJennifer Castillo, CMA

## 2013-09-21 ENCOUNTER — Ambulatory Visit (INDEPENDENT_AMBULATORY_CARE_PROVIDER_SITE_OTHER): Payer: BC Managed Care – PPO | Admitting: Family Medicine

## 2013-09-21 ENCOUNTER — Encounter: Payer: Self-pay | Admitting: Family Medicine

## 2013-09-21 VITALS — BP 110/66 | HR 90 | Temp 98.2°F | Ht 63.0 in | Wt 158.0 lb

## 2013-09-21 DIAGNOSIS — L02429 Furuncle of limb, unspecified: Secondary | ICD-10-CM

## 2013-09-21 MED ORDER — DOXYCYCLINE HYCLATE 100 MG PO CAPS: 100.0000 mg | ORAL_CAPSULE | Freq: Two times a day (BID) | ORAL | Status: AC

## 2013-09-21 NOTE — Progress Notes (Signed)
Pre visit review using our clinic review tool, if applicable. No additional management support is needed unless otherwise documented below in the visit note. 

## 2013-09-21 NOTE — Progress Notes (Signed)
   Subjective:    Patient ID: [name] [name], female    DOB: 1966/03/07, 48 y.o.   MRN: [id number]  HPI Here for one week of a red tender spot on the right lower leg. This started after she shaved her legs last week. No fever. The area had been swollen and quite red until she took 2 doses of Doxycycline yesterday. Now today the redness seems to have resolved a little.    Review of Systems  Constitutional: Negative.   Skin: Positive for wound.       Objective:   Physical Exam  Constitutional: She appears well-developed and well-nourished.  Skin:  Right lower leg has a red, warm, tender lump which is not fluctuant          Assessment & Plan:  Boil. Use warm compresses. Cover with Doxycycline for 14 days. Recheck prn

## 2013-10-04 ENCOUNTER — Telehealth: Payer: Self-pay | Admitting: Family Medicine

## 2013-10-04 NOTE — Telephone Encounter (Signed)
Patient Information:  Caller Name: Gunnar Fusiaula  Phone: [phone]  Patient: [name] [name], [name] [name]  Gender: Female  DOB: December 11, 1965  Age: 48 Years  PCP: Gershon CraneFry, [facility](Family Practice)  Pregnant: No  Office Follow Up:  Does the office need to follow up with this patient?: No  Instructions For The Office: N/A  RN Note:  Pt concerned that the area is still hardened and not completely healed.  Symptoms  Reason For Call & Symptoms: Onset 2 -3 weeks ago of approx. 50 cent piece area above right ankle infected - area still reddened and hard to touch.  Seen in office 2 weeks ago by Dr. Clent RidgesFry with abx (Doxycycline x 14 days) prescribed.  Redness is gone but pt states that the area is still hardened.  Reviewed Health History In EMR: Yes  Reviewed Medications In EMR: Yes  Reviewed Allergies In EMR: Yes  Reviewed Surgeries / Procedures: Yes  Date of Onset of Symptoms: 09/18/2013  Treatments Tried: Doxycycline x 14 days (finishes 10/05/2013)  Treatments Tried Worked: No OB / GYN:  LMP: 09/13/2013  Guideline(s) Used:  Wound Infection  Disposition Per Guideline:   Home Care  Reason For Disposition Reached:   Wound doesn't sound infected, or only mild redness  Advice Given:  N/A  Patient Will Follow Care Advice:  YES

## 2014-02-23 ENCOUNTER — Ambulatory Visit (INDEPENDENT_AMBULATORY_CARE_PROVIDER_SITE_OTHER): Payer: BC Managed Care – PPO | Admitting: Pulmonary Disease

## 2014-02-23 ENCOUNTER — Ambulatory Visit: Payer: BC Managed Care – PPO | Admitting: Pulmonary Disease

## 2014-02-23 ENCOUNTER — Encounter: Payer: Self-pay | Admitting: Pulmonary Disease

## 2014-02-23 VITALS — BP 108/60 | HR 89 | Temp 97.3°F | Ht 63.0 in | Wt 160.8 lb

## 2014-02-23 DIAGNOSIS — J45909 Unspecified asthma, uncomplicated: Secondary | ICD-10-CM

## 2014-02-23 MED ORDER — FLUTICASONE-SALMETEROL 250-50 MCG/DOSE IN AEPB: 1.0000 | INHALATION_SPRAY | Freq: Two times a day (BID) | RESPIRATORY_TRACT | Status: DC

## 2014-02-23 NOTE — Progress Notes (Signed)
   Subjective:    Patient ID: [name] [name], female    DOB: May 14, 1966, 48 y.o.   MRN: [id number]  HPI  48 yo WF, with known history of Asthma since childhood, worse during & after pregnancy 2001.  Triggers - GERD vs allergies  Hx of severe reflux for many yrs, improved after Nissen's 2008.  9/09 >> spirometry nml, FEv1 99%  Breakthrough reflux 4-5/wk, takes tums every night with ranitidine; reglan did not work (dysmotility noted on studies by Dr. Daphine Deutschermartin post Nissen's in 2008).   Nov 06, 2009 - stopped zoloft/wellbutrin and lost 30lbs. GERD under good control - off omeprazole, on tums twice/ month.    02/23/2014  Chief Complaint  Patient presents with  . Follow-up    Pt reports breathing has been good. pt reports since being changed to dulera, she feels more tight in chest. Doesn't feel like it hold her over until her next dose. Denies any cough.   Changed from  Symbicort to dulera earlier in the year since. Insurance would not cover. Formulary will cover Advair or dulera.  Used it x 3 mnths , c/o chest tightness, feels dulera not working as well No allergy symptoms, GERD under control No fever, chest pain, orthopnea or edema  Spirometry - no airway obstruction   Review of Systems neg for any significant sore throat, dysphagia, itching, sneezing, nasal congestion or excess/ purulent secretions, fever, chills, sweats, unintended wt loss, pleuritic or exertional cp, hempoptysis, orthopnea pnd or change in chronic leg swelling. Also denies presyncope, palpitations, heartburn, abdominal pain, nausea, vomiting, diarrhea or change in bowel or urinary habits, dysuria,hematuria, rash, arthralgias, visual complaints, headache, numbness weakness or ataxia.     Objective:   Physical Exam  Gen. Pleasant, well-nourished, in no distress ENT - no lesions, no post nasal drip Neck: No JVD, no thyromegaly, no carotid bruits Lungs: no use of accessory muscles, no dullness to percussion, clear  without rales or rhonchi  Cardiovascular: Rhythm regular, heart sounds  normal, no murmurs or gallops, no peripheral edema Musculoskeletal: No deformities, no cyanosis or clubbing        Assessment & Plan:

## 2014-02-23 NOTE — Assessment & Plan Note (Signed)
Symptoms do not match -lung function preserved Could this be related to GERD? Rather than ineffective dulera  Trial of pepcid x 2 weeks Call if this works If not, we will send Rx for advair 250 1 puff twice daily- RINSE mouth after Call us if increased albuterol use

## 2014-02-23 NOTE — Patient Instructions (Addendum)
Trial of pepcid x 2 weeks Call if this works If not, we will send Rx for advair 250 1 puff twice daily- RINSE mouth after Call us if increased albuterol use

## 2014-03-21 ENCOUNTER — Telehealth: Payer: Self-pay | Admitting: Pulmonary Disease

## 2014-03-21 MED ORDER — MOMETASONE FURO-FORMOTEROL FUM 100-5 MCG/ACT IN AERO: 2.0000 | INHALATION_SPRAY | Freq: Two times a day (BID) | RESPIRATORY_TRACT | Status: DC

## 2014-03-21 MED ORDER — ALBUTEROL SULFATE HFA 108 (90 BASE) MCG/ACT IN AERS: INHALATION_SPRAY | RESPIRATORY_TRACT | Status: DC

## 2014-03-21 NOTE — Telephone Encounter (Signed)
lmomtcb x1 for pt. RX for dulera sent to [facility]

## 2014-03-21 NOTE — Telephone Encounter (Signed)
OK to send rx for dulera 100

## 2014-03-21 NOTE — Telephone Encounter (Signed)
Per 02/23/14 OV: Patient Instructions      Trial of pepcid x 2 weeks Call if this works If not, we will send Rx for advair 250 1 puff twice daily- RINSE mouth after Call us if increased albuterol use   Called spoke with pt. Pt prefers to stay on dulera d/t the cost right now. She did start the pepcid and feels it does help. She would [facility] for [employer] in. Please advise thanks

## 2014-03-22 NOTE — Telephone Encounter (Signed)
Pt aware that rx was sent.  Nothing further needed at this time.

## 2014-03-24 ENCOUNTER — Encounter: Payer: Self-pay | Admitting: Internal Medicine

## 2014-03-29 ENCOUNTER — Other Ambulatory Visit: Payer: Self-pay | Admitting: Obstetrics and Gynecology

## 2014-03-29 DIAGNOSIS — N6459 Other signs and symptoms in breast: Secondary | ICD-10-CM

## 2014-03-30 ENCOUNTER — Ambulatory Visit
Admission: RE | Admit: 2014-03-30 | Discharge: 2014-03-30 | Disposition: A | Discharge status: Home / Self Care | Payer: BC Managed Care – PPO | Source: Ambulatory Visit | Attending: Obstetrics and Gynecology | Admitting: Obstetrics and Gynecology

## 2014-03-30 DIAGNOSIS — N6459 Other signs and symptoms in breast: Secondary | ICD-10-CM

## 2014-05-17 ENCOUNTER — Telehealth: Payer: Self-pay | Admitting: Pulmonary Disease

## 2014-05-17 NOTE — Telephone Encounter (Signed)
lmomtcb x1 

## 2014-05-18 MED ORDER — MOMETASONE FURO-FORMOTEROL FUM 100-5 MCG/ACT IN AERO: 2.0000 | INHALATION_SPRAY | Freq: Two times a day (BID) | RESPIRATORY_TRACT | Status: DC

## 2014-05-18 NOTE — Telephone Encounter (Signed)
Lmtcb for pt.  

## 2014-05-18 NOTE — Telephone Encounter (Signed)
Pt returned call. Pt requesting 3 month refill on Dulera. Rx sent in to preferred pharmacy. Pt verbalized understanding and denied any further questions or concerns at this time.

## 2014-05-25 ENCOUNTER — Telehealth: Payer: Self-pay | Admitting: Pulmonary Disease

## 2014-05-25 NOTE — Telephone Encounter (Signed)
i called and spoke with the pharmacy and they stated that the rx for the 3 month supply was placed on hold and when the pt came in to pick up her rx they gave her the 1 month supply of dulera that they already had.  They advised me to have the pt bring back the 1 month supply and they will take this back and give her the three month supply.    i called the pt and she is aware of this since she called the pharmacy too.  She stated that she may just keep the 1 month supply and get the 3 month supply filled next time.  Nothing further is needed.

## 2014-06-06 ENCOUNTER — Ambulatory Visit (INDEPENDENT_AMBULATORY_CARE_PROVIDER_SITE_OTHER): Payer: BC Managed Care – PPO | Admitting: Family Medicine

## 2014-06-06 ENCOUNTER — Encounter: Payer: Self-pay | Admitting: Family Medicine

## 2014-06-06 VITALS — BP 104/76 | HR 108 | Temp 99.0°F | Ht 63.0 in | Wt 158.0 lb

## 2014-06-06 DIAGNOSIS — J4520 Mild intermittent asthma, uncomplicated: Secondary | ICD-10-CM

## 2014-06-06 DIAGNOSIS — J209 Acute bronchitis, unspecified: Secondary | ICD-10-CM

## 2014-06-06 MED ORDER — METHYLPREDNISOLONE ACETATE 80 MG/ML IJ SUSP
120.0000 mg | Freq: Once | INTRAMUSCULAR | Status: AC
  Administered 2014-06-06: 120 mg via INTRAMUSCULAR

## 2014-06-06 MED ORDER — MOMETASONE FURO-FORMOTEROL FUM 200-5 MCG/ACT IN AERO: 2.0000 | INHALATION_SPRAY | Freq: Two times a day (BID) | RESPIRATORY_TRACT | Status: DC

## 2014-06-06 MED ORDER — AZITHROMYCIN 250 MG PO TABS: ORAL_TABLET | ORAL | Status: DC

## 2014-06-06 NOTE — Progress Notes (Signed)
   Subjective:    Patient ID: [name] [name], female    DOB: 1966-05-09, 48 y.o.   MRN: [id number]  HPI Here for 2 weeks of chest tightness, wheezing, and coughing up yellow sputum. No fever. She is using Dulera 100/5 bid but this is not working as well as the Advair did.    Review of Systems  Constitutional: Negative.   HENT: Positive for congestion. Negative for postnasal drip and sinus pressure.   Eyes: Negative.   Respiratory: Positive for cough, chest tightness, shortness of breath and wheezing.   Cardiovascular: Negative.        Objective:   Physical Exam  Constitutional: She appears well-developed and well-nourished.  HENT:  Right Ear: External ear normal.  Left Ear: External ear normal.  Nose: Nose normal.  Mouth/Throat: Oropharynx is clear and moist.  Eyes: Conjunctivae are normal.  Cardiovascular: Normal rate, regular rhythm, normal heart sounds and intact distal pulses.   Pulmonary/Chest: Effort normal. No respiratory distress. She has no rales.  Scattered wheezes and rhonchi   Lymphadenopathy:    She has no cervical adenopathy.          Assessment & Plan:  Increase Dulera to 220/5 to take 2 puffs bid. Treat with a Zpack and a steroid shot.

## 2014-06-06 NOTE — Progress Notes (Signed)
Pre visit review using our clinic review tool, if applicable. No additional management support is needed unless otherwise documented below in the visit note. 

## 2014-06-06 NOTE — Addendum Note (Signed)
Addended by: Aniceto BossNIMMONS, SYLVIA A on: 06/06/2014 12:29 PM   Modules accepted: Orders

## 2014-06-09 ENCOUNTER — Telehealth: Payer: Self-pay | Admitting: Family Medicine

## 2014-06-09 MED ORDER — METHYLPREDNISOLONE 4 MG PO KIT: PACK | ORAL | Status: DC

## 2014-06-09 MED ORDER — AZITHROMYCIN 250 MG PO TABS: ORAL_TABLET | ORAL | Status: DC

## 2014-06-09 NOTE — Telephone Encounter (Signed)
Prescriptions sent to pharmacy. Left a message for pt making aware.

## 2014-06-09 NOTE — Telephone Encounter (Signed)
Call in a Zpack and a Medrol dose pack

## 2014-06-09 NOTE — Telephone Encounter (Signed)
Patient Information:  Caller Name: Gunnar Fusiaula  Phone: [phone]  Patient: Bryan LemmaHargrove, Konner F  Gender: Female  DOB: [birth year]  Age: [age] Years  PCP: Gershon CraneFry, [facility](Family Practice)  Pregnant: No  Office Follow Up:  Does the office need to follow up with this patient?: No  Instructions For The Office: N/A   Symptoms  Reason For Call & Symptoms: Pt saw Dr. Clent RidgesFry 06/06/14 and received a steroid shot and antibiotic.  She has a cough and now her chest is tight and especially at night.  She has been pushing fluids and has been takling Mucinex next.   Pt is a Runner, broadcasting/film/videoteacher and needs an appt quickly.  Pt hung up before appt could be made.  Called back and left her a message.   Reviewed Health History In EMR: Yes  Reviewed Medications In EMR: Yes  Reviewed Allergies In EMR: Yes  Reviewed Surgeries / Procedures: N/A  Date of Onset of Symptoms: 06/06/2014  Treatments Tried: Antibiotic, Mucinex and Prednisone shot  Treatments Tried Worked: No OB / GYN:  LMP: Unknown  Guideline(s) Used:  No Protocol Available - Information Only  Disposition Per Guideline:   Home Care  Reason For Disposition Reached:   Information only question and nurse able to answer  Advice Given:  N/A  Patient Will Follow Care Advice: Pt hung up before advice could be given or appt made.  Call back made and message left.

## 2014-06-09 NOTE — Telephone Encounter (Signed)
Patient Information:  Caller Name: Gunnar Fusiaula  Phone: [phone]  Patient: [name] [name], [name] [name]  Gender: Female  DOB: 08-Nov-1965  Age: 48 Years  PCP: Gershon CraneFry, [facility](Family Practice)  Pregnant: No  Office Follow Up:  Does the office need to follow up with this patient?: Yes  Instructions For The Office: Please contact patient. Declined to do triage.  Request Zpack and Steroid pack.  No improvement of symptoms Does not want office appt.  RN Note:  She does not want to do triage. She is currently working as a Hotel managersubstitue teacher and cannot complete triage .  She would like Dr. Clent RidgesFry to call in medication . She does not have the money for a return visit. Please contact patient.  Symptoms  Reason For Call & Symptoms: Patient was seen in office on 06/06/14 with Acute Bronchitis. She was given Zithromax, Dulera and steroid shot. She is calling back today due to "tightness in her chest". "The congestion will not break up".  The Elwin SleightDulera has helped "but is having to use her rescue inhaler".  She has 1 pill of Zithromax left.  (1) cough intermittent worse with ongoing speaking or exertion. Occasionally productive.  (2) Wheezing with expiration and raspy voice.  (3) Tightness . Voice is clear, no sore throat. Afebrile.   She was told to call back for no improvement.  She states what normally works- Steroid pack and Zpack.  Reviewed Health History In EMR: Yes  Reviewed Medications In EMR: Yes  Reviewed Allergies In EMR: Yes  Reviewed Surgeries / Procedures: Yes  Date of Onset of Symptoms: 06/06/2014  Treatments Tried: Dulera zithromax, Zyrtec D, mucinex.  Treatments Tried Worked: No OB / GYN:  LMP: Unknown  Guideline(s) Used:  No Protocol Available - Sick Adult  Disposition Per Guideline:   Discuss with PCP and Callback by Nurse Today  Reason For Disposition Reached:   Nursing judgment  Advice Given:  Call Back If:  New symptoms develop  You become worse.  RN Overrode Recommendation:  Patient  Requests Prescription  Please contact patient. Declined to do triage.  Request Zpack and Steroid pack.  No improvement of symptoms Does not want office appt.

## 2014-06-09 NOTE — Telephone Encounter (Signed)
See my answer to the other request

## 2015-03-31 ENCOUNTER — Ambulatory Visit (INDEPENDENT_AMBULATORY_CARE_PROVIDER_SITE_OTHER): Payer: BLUE CROSS/BLUE SHIELD | Admitting: Family Medicine

## 2015-03-31 ENCOUNTER — Encounter: Payer: Self-pay | Admitting: Family Medicine

## 2015-03-31 VITALS — BP 103/70 | HR 82 | Temp 98.9°F | Ht 63.0 in | Wt 166.0 lb

## 2015-03-31 DIAGNOSIS — K148 Other diseases of tongue: Secondary | ICD-10-CM | POA: Diagnosis not present

## 2015-03-31 NOTE — Progress Notes (Signed)
Pre visit review using our clinic review tool, if applicable. No additional management support is needed unless otherwise documented below in the visit note. 

## 2015-03-31 NOTE — Progress Notes (Signed)
   Subjective:    Patient ID: [name] [name], female    DOB: February 18, 1966, 49 y.o.   MRN: [id number]  HPI Here to check a lesion on the tongue that appeared about 2 months ago. The day before that she had bought a banana milkshake and it burned her mouth and tongue. Then the next day she noticed this lump pn the tongue. It is not sore but it will nor go away. She tends to bite it repeatedly when chewing her food.    Review of Systems  Constitutional: Negative.   HENT: Negative for mouth sores, sore throat, trouble swallowing and voice change.        Objective:   Physical Exam  Constitutional: She appears well-developed and well-nourished.  HENT:  Right Ear: External ear normal.  Left Ear: External ear normal.  Nose: Nose normal.  There is a 5mm smooth papular lesion on the right edge of the tongue. It is not tender. There is also a much smaller papule directly anterior to this, ans well as a small papular lesion on the left edge of the tongue.   Eyes: Conjunctivae are normal.  Neck: No thyromegaly present.  Lymphadenopathy:    She has no cervical adenopathy.          Assessment & Plan:  Tongue lesions. Refer to ENT to biopsy or remove

## 2015-04-06 ENCOUNTER — Other Ambulatory Visit: Payer: Self-pay | Admitting: Otolaryngology

## 2015-04-27 ENCOUNTER — Other Ambulatory Visit: Payer: Self-pay | Admitting: Family Medicine

## 2015-04-27 MED ORDER — ALBUTEROL SULFATE HFA 108 (90 BASE) MCG/ACT IN AERS: INHALATION_SPRAY | RESPIRATORY_TRACT | Status: DC

## 2015-04-27 NOTE — Telephone Encounter (Signed)
[id number] [place] [facility] [place] - [id number] [phone]  Requesting refill of albuterol (PROAIR HFA) 108 (90 BASE) MCG/ACT inhaler

## 2015-04-27 NOTE — Telephone Encounter (Signed)
Rx sent to pharmacy   

## 2015-05-22 ENCOUNTER — Ambulatory Visit (HOSPITAL_COMMUNITY)
Admission: RE | Admit: 2015-05-22 | Discharge: 2015-05-22 | Disposition: A | Discharge status: Home / Self Care | Payer: BLUE CROSS/BLUE SHIELD | Source: Ambulatory Visit | Attending: Cardiology | Admitting: Cardiology

## 2015-05-22 ENCOUNTER — Telehealth: Payer: Self-pay | Admitting: *Deleted

## 2015-05-22 ENCOUNTER — Encounter: Payer: Self-pay | Admitting: Family Medicine

## 2015-05-22 ENCOUNTER — Ambulatory Visit (INDEPENDENT_AMBULATORY_CARE_PROVIDER_SITE_OTHER): Payer: BLUE CROSS/BLUE SHIELD | Admitting: Family Medicine

## 2015-05-22 VITALS — BP 106/78 | HR 80 | Temp 98.3°F | Ht 63.0 in | Wt 166.0 lb

## 2015-05-22 DIAGNOSIS — M79605 Pain in left leg: Secondary | ICD-10-CM | POA: Insufficient documentation

## 2015-05-22 DIAGNOSIS — M25562 Pain in left knee: Secondary | ICD-10-CM | POA: Insufficient documentation

## 2015-05-22 NOTE — Progress Notes (Signed)
Pre visit review using our clinic review tool, if applicable. No additional management support is needed unless otherwise documented below in the visit note. 

## 2015-05-22 NOTE — Telephone Encounter (Signed)
Vascular lab called to confirm scan on left lower extremity was negative for DVT

## 2015-05-22 NOTE — Progress Notes (Signed)
   Subjective:    Patient ID: [name] [name] [employer], female    DOB: [birth year], 49 y.o.   MRN: [id number]  HPI Here for 3 days of intermittent sharp fleeting pains in the left calf. No swelling in the foot or the leg. No recent trauma. Aspirin and Advil seem to help this. No back pain. No numbness or tingling.    Review of Systems  Constitutional: Negative.   Respiratory: Negative.   Cardiovascular: Negative.   Musculoskeletal: Positive for myalgias.  Neurological: Negative.        Objective:   Physical Exam  Constitutional: She is oriented to person, place, and time. She appears well-developed and well-nourished. No distress.  Neck: No thyromegaly present.  Cardiovascular: Normal rate, regular rhythm, normal heart sounds and intact distal pulses.   Pulmonary/Chest: Effort normal and breath sounds normal.  Musculoskeletal: She exhibits no edema.  She is tender along the medial thigh and along the medial calf. No cords felt, no swelling. Denna HaggardHomans is negative   Lymphadenopathy:    She has no cervical adenopathy.  Neurological: She is alert and oriented to person, place, and time.          Assessment & Plan:  Left leg pain, possible phlebitis. We need to rule out a DVT. She will have a venous doppler today.

## 2015-05-23 NOTE — Telephone Encounter (Signed)
I spoke with pt and went over below information. 

## 2015-05-23 NOTE — Telephone Encounter (Signed)
Noted. Please inform the patient. Have her stay off her feet and elevate the leg. Use warm compresses and take 800 mg of Ibuprofen 3 or 4 times a day for a few days. Recheck prn

## 2015-05-24 ENCOUNTER — Telehealth: Payer: Self-pay | Admitting: Family Medicine

## 2015-05-24 NOTE — Telephone Encounter (Signed)
Pt was seen on 05/22/15 for leg pain and had negative ultrasound. Pt is still having leg pain. Please advise

## 2015-05-25 NOTE — Telephone Encounter (Signed)
This may be due to phlebitis (inflammation of a vein). Use moist hot compresses. Call in Indomethacin 50 mg TID, #30 with no rf

## 2015-05-26 MED ORDER — INDOMETHACIN 50 MG PO CAPS: 50.0000 mg | ORAL_CAPSULE | Freq: Three times a day (TID) | ORAL | Status: DC

## 2015-05-26 NOTE — Addendum Note (Signed)
Addended by: Azucena FreedMILLNER, Charlcie Prisco C on: 05/26/2015 01:19 PM   Modules accepted: Orders

## 2015-05-26 NOTE — Telephone Encounter (Signed)
Attempeted to leave a message for pt-vm is full; rx sent to pharmacy.

## 2015-09-11 ENCOUNTER — Telehealth: Payer: Self-pay | Admitting: Family Medicine

## 2015-09-11 NOTE — Telephone Encounter (Signed)
I agree her symptoms do not suggest influenza. I do not recommend she take any preventive meds

## 2015-09-11 NOTE — Telephone Encounter (Signed)
Spoke with patient. I advised her that with her symptoms - it did not sound like she needed to be tested, but we would more than happy to see her. Patient states that she thinks it is just allergies, and that she really feels fine other than a stuffy nose.Patient states that she is wearing a mask when around her father, and keeping household items disinfected, as well as keeping good hand-washing habits. I advised patient that those precautionary measures were good habits and to keep doing so. Patient still wanted me to send message to Dr. Clent RidgesFry to see if any precautionary medications can be called in, or if there is anything she should do to prevent getting the flu.

## 2015-09-11 NOTE — Telephone Encounter (Signed)
I spoke with pt  

## 2015-09-11 NOTE — Telephone Encounter (Signed)
Left message for patient to return call.

## 2015-09-11 NOTE — Telephone Encounter (Signed)
Her dad was dx with the flu over the weekend and is concerned about what sx she should look for. Has some head congestion and started sneezing last night. Went to work this morning but wanted to know if she should get a flu test.

## 2015-09-21 ENCOUNTER — Telehealth: Payer: Self-pay | Admitting: Family Medicine

## 2015-09-21 NOTE — Telephone Encounter (Signed)
[facility] - Client TELEPHONE ADVICE RECORD [facility] Patient Name: [name] [name] DOB: 07/24/1965 Initial Comment Caller states, wants an appt today or tomorrow, heaviness and tightness in chest, and pain in shoulders and elbows Nurse Assessment Nurse: Lane HackerHarley, RN, Elvin SoWindy Date/Time (Eastern Time): 09/21/2015 12:26:19 PM Confirm and document reason for call. If symptomatic, describe symptoms. You must click the next button to save text entered. ---Caller states she was having chest heaviness and tightness, 2/10 pain intensity, last night for last 2 nights. Lasted < 5 minutes. Along with CP, she had left elbow - not felt in upper or lower arm - rates 3/10. Wondered if she had twisted her elbow after putting on lotion. She felt the right shoulder and left elbow pain of 3/10 this morning while getting ready for work. Moving the elbow and shoulder did not worsen the pain. - Currently, right shoulder is with same mild pain. She also holding cell phone with that shoulder and walking around. No CP, SOB, N/V, diaphoresis, fever. No injury. No reflux s/s with this. One wk ago felt heart racing fast for a few minutes and no other s/s. Has the patient traveled out of the country within the last 30 days? ---No Does the patient have any new or worsening symptoms? ---Yes Will a triage be completed? ---Yes Related visit to physician within the last 2 weeks? ---No Does the PT have any chronic conditions? (i.e. diabetes, asthma, etc.) ---Yes List chronic conditions. ---Asthma, Reflux (she has had a lot of reflux lately over past 2 years d/t gaining weight), Anxiety - hasn't taken meds for a while, palpitations off/on Is the patient pregnant or possibly pregnant? (Ask all females between the ages of 5312-55) ---No Is this a behavioral health or substance abuse call? ---No Guidelines Guideline Title Affirmed Question Affirmed Notes Chest Pain [1] Chest  pain lasting <= 5 minutes AND [2] NO chest pain or cardiac symptoms now (Exceptions: pains lasting a few seconds) Final Disposition User See Physician within 24 Hours Hasley CanyonHarley, RN, ClearyWindy Comments Relaxing makes it feel better. Pain does not worsen with movement. No CP now. No available appts today. Appt made with Dr. Clent RidgesFry for 1:30 pm. If MD feels that pt should be seen sooner, please contact her. Based on outcome to be seen within 24 hours, RN feels that this will be ok. Pt will call back if s/s return, and as advised. Referrals REFERRED TO PCP OFFICE Disagree/Comply: Comply

## 2015-09-21 NOTE — Telephone Encounter (Signed)
See below

## 2015-09-22 ENCOUNTER — Encounter: Payer: Self-pay | Admitting: Family Medicine

## 2015-09-22 ENCOUNTER — Ambulatory Visit (INDEPENDENT_AMBULATORY_CARE_PROVIDER_SITE_OTHER): Payer: BLUE CROSS/BLUE SHIELD | Admitting: Family Medicine

## 2015-09-22 VITALS — BP 114/86 | HR 87 | Temp 98.5°F | Ht 63.0 in | Wt 163.0 lb

## 2015-09-22 DIAGNOSIS — R0789 Other chest pain: Secondary | ICD-10-CM

## 2015-09-22 DIAGNOSIS — R1011 Right upper quadrant pain: Secondary | ICD-10-CM | POA: Diagnosis not present

## 2015-09-22 MED ORDER — ALBUTEROL SULFATE HFA 108 (90 BASE) MCG/ACT IN AERS: INHALATION_SPRAY | RESPIRATORY_TRACT | Status: DC

## 2015-09-22 MED ORDER — MOMETASONE FURO-FORMOTEROL FUM 200-5 MCG/ACT IN AERO: 2.0000 | INHALATION_SPRAY | Freq: Two times a day (BID) | RESPIRATORY_TRACT | Status: DC

## 2015-09-22 MED ORDER — PANTOPRAZOLE SODIUM 40 MG PO TBEC: 40.0000 mg | DELAYED_RELEASE_TABLET | Freq: Every day | ORAL | Status: DC

## 2015-09-22 NOTE — Progress Notes (Signed)
   Subjective:    Patient ID: [name] [name], female    DOB: [birth year], 50 y.o.   MRN: [id number]  HPI Here for several weeks of intermittent epigastric pains, RUQ pains, and right sided chest pains. No SOB or nausea. These pains were mild and not dependent on exercise. They last several hours at a time. She is S/P a [employer] fundiplication 10 yrs ago and a cholecystectomy.    Review of Systems  Constitutional: Negative.   Respiratory: Negative.   Cardiovascular: Positive for chest pain. Negative for palpitations and leg swelling.  Gastrointestinal: Positive for abdominal pain. Negative for nausea, vomiting, diarrhea, constipation, blood in stool, abdominal distention, anal bleeding and rectal pain.       Objective:   Physical Exam  Constitutional: She appears well-developed and well-nourished. No distress.  Neck: No thyromegaly present.  Cardiovascular: Normal rate, regular rhythm, normal heart sounds and intact distal pulses.   EKG normal   Pulmonary/Chest: Effort normal and breath sounds normal. No respiratory distress. She has no wheezes. She has no rales. She exhibits no tenderness.  Abdominal: Soft. Bowel sounds are normal. She exhibits no distension and no mass. There is no rebound and no guarding.  Mildly tender in the RUQ  Lymphadenopathy:    She has no cervical adenopathy.          Assessment & Plan:  This is likely duodenitis. Se will get back on Protonix 40 mg daily. Recheck prn

## 2015-09-22 NOTE — Progress Notes (Signed)
Pre visit review using our clinic review tool, if applicable. No additional management support is needed unless otherwise documented below in the visit note. 

## 2016-04-08 ENCOUNTER — Emergency Department (HOSPITAL_COMMUNITY)
Admission: EM | Admit: 2016-04-08 | Discharge: 2016-04-08 | Disposition: A | Discharge status: Home / Self Care | Payer: BLUE CROSS/BLUE SHIELD | Attending: Physician Assistant | Admitting: Physician Assistant

## 2016-04-08 ENCOUNTER — Emergency Department (HOSPITAL_COMMUNITY): Payer: BLUE CROSS/BLUE SHIELD

## 2016-04-08 ENCOUNTER — Encounter (HOSPITAL_COMMUNITY): Payer: Self-pay | Admitting: Emergency Medicine

## 2016-04-08 DIAGNOSIS — S0101XA Laceration without foreign body of scalp, initial encounter: Secondary | ICD-10-CM

## 2016-04-08 DIAGNOSIS — R55 Syncope and collapse: Secondary | ICD-10-CM | POA: Diagnosis not present

## 2016-04-08 DIAGNOSIS — Y999 Unspecified external cause status: Secondary | ICD-10-CM | POA: Diagnosis not present

## 2016-04-08 DIAGNOSIS — J45909 Unspecified asthma, uncomplicated: Secondary | ICD-10-CM | POA: Diagnosis not present

## 2016-04-08 DIAGNOSIS — R51 Headache: Secondary | ICD-10-CM | POA: Diagnosis not present

## 2016-04-08 DIAGNOSIS — Y9389 Activity, other specified: Secondary | ICD-10-CM | POA: Diagnosis not present

## 2016-04-08 DIAGNOSIS — Y9289 Other specified places as the place of occurrence of the external cause: Secondary | ICD-10-CM | POA: Diagnosis not present

## 2016-04-08 DIAGNOSIS — W1839XA Other fall on same level, initial encounter: Secondary | ICD-10-CM | POA: Insufficient documentation

## 2016-04-08 DIAGNOSIS — Z79899 Other long term (current) drug therapy: Secondary | ICD-10-CM | POA: Insufficient documentation

## 2016-04-08 DIAGNOSIS — Z23 Encounter for immunization: Secondary | ICD-10-CM | POA: Insufficient documentation

## 2016-04-08 DIAGNOSIS — Z7951 Long term (current) use of inhaled steroids: Secondary | ICD-10-CM | POA: Insufficient documentation

## 2016-04-08 DIAGNOSIS — R519 Headache, unspecified: Secondary | ICD-10-CM

## 2016-04-08 LAB — I-STAT TROPONIN, ED: Troponin i, poc: 0 ng/mL (ref 0.00–0.08)

## 2016-04-08 LAB — CBC
HCT: 39.7 % (ref 36.0–46.0)
Hemoglobin: 13.3 g/dL (ref 12.0–15.0)
MCH: 30.4 pg (ref 26.0–34.0)
MCHC: 33.5 g/dL (ref 30.0–36.0)
MCV: 90.8 fL (ref 78.0–100.0)
Platelets: 279 10*3/uL (ref 150–400)
RBC: 4.37 MIL/uL (ref 3.87–5.11)
RDW: 12.2 % (ref 11.5–15.5)
WBC: 16.1 10*3/uL — ABNORMAL HIGH (ref 4.0–10.5)

## 2016-04-08 LAB — BASIC METABOLIC PANEL
Anion gap: 8 (ref 5–15)
BUN: 10 mg/dL (ref 6–20)
CO2: 23 mmol/L (ref 22–32)
Calcium: 8.5 mg/dL — ABNORMAL LOW (ref 8.9–10.3)
Chloride: 108 mmol/L (ref 101–111)
Creatinine, Ser: 0.87 mg/dL (ref 0.44–1.00)
GFR calc Af Amer: >60 mL/min (ref >60)
GFR calc non Af Amer: >60 mL/min (ref >60)
Glucose, Bld: 103 mg/dL — ABNORMAL HIGH (ref 65–99)
Potassium: 3.3 mmol/L — ABNORMAL LOW (ref 3.5–5.1)
Sodium: 139 mmol/L (ref 135–145)

## 2016-04-08 LAB — URINALYSIS, ROUTINE W REFLEX MICROSCOPIC
Bilirubin Urine: NEGATIVE
Glucose, UA: NEGATIVE mg/dL
Hgb urine dipstick: NEGATIVE
Ketones, ur: NEGATIVE mg/dL
Nitrite: POSITIVE — AB
Protein, ur: NEGATIVE mg/dL
Specific Gravity, Urine: 1.009 (ref 1.005–1.030)
pH: 7.5 (ref 5.0–8.0)

## 2016-04-08 LAB — URINE MICROSCOPIC-ADD ON

## 2016-04-08 LAB — CBG MONITORING, ED: Glucose-Capillary: 95 mg/dL (ref 65–99)

## 2016-04-08 MED ORDER — MECLIZINE HCL 25 MG PO TABS
25.0000 mg | ORAL_TABLET | Freq: Once | ORAL | Status: AC
  Administered 2016-04-08: 25 mg via ORAL
  Filled 2016-04-08: qty 1

## 2016-04-08 MED ORDER — DIPHENHYDRAMINE HCL 50 MG/ML IJ SOLN
12.5000 mg | Freq: Once | INTRAMUSCULAR | Status: AC
  Administered 2016-04-08: 12.5 mg via INTRAVENOUS
  Filled 2016-04-08: qty 1

## 2016-04-08 MED ORDER — TETANUS-DIPHTH-ACELL PERTUSSIS 5-2.5-18.5 LF-MCG/0.5 IM SUSP
0.5000 mL | Freq: Once | INTRAMUSCULAR | Status: AC
  Administered 2016-04-08: 0.5 mL via INTRAMUSCULAR
  Filled 2016-04-08: qty 0.5

## 2016-04-08 MED ORDER — MECLIZINE HCL 12.5 MG PO TABS: 12.5000 mg | ORAL_TABLET | Freq: Three times a day (TID) | ORAL | 0 refills | Status: DC | PRN

## 2016-04-08 MED ORDER — PROCHLORPERAZINE EDISYLATE 5 MG/ML IJ SOLN
10.0000 mg | Freq: Once | INTRAMUSCULAR | Status: AC
  Administered 2016-04-08: 10 mg via INTRAVENOUS
  Filled 2016-04-08: qty 2

## 2016-04-08 MED ORDER — SODIUM CHLORIDE 0.9 % IV BOLUS (SEPSIS)
500.0000 mL | Freq: Once | INTRAVENOUS | Status: AC
  Administered 2016-04-08: 500 mL via INTRAVENOUS

## 2016-04-08 NOTE — Discharge Instructions (Signed)
You will need to have your staple removed in 1 week. Take ibuprofen as needed for pain. Drink plenty of fluids. Take meclizine as needed for dizziness. Follow up with PCP as needed. Return to the ED if you experience loss of consciousness, blurry vision, vomiting, weakness in an extremity.

## 2016-04-08 NOTE — ED Notes (Signed)
Electronic signature signed by husband per patient's request.

## 2016-04-08 NOTE — ED Notes (Signed)
Patient is resting comfortably. 

## 2016-04-08 NOTE — ED Notes (Signed)
Patient transported to X-ray 

## 2016-04-08 NOTE — ED Provider Notes (Signed)
[facility]-EMERGENCY DEPT Provider Note   CSN: [id number] Arrival date & time: 04/08/16  4098     History   Chief Complaint Chief Complaint  Patient presents with  . Loss of Consciousness  . Head Injury    HPI [name] [name] is a 50 y.o. female with a pmhx of anxiety, migraines who presents to the ED today to beevaluated after a syncopal episode. Pt states that she was woke up around 4 am with a frontal headache. Pt states that this felt like a typical migraine so he husband went downstairs to get her an Excedrin. Pt states that she stood up to go to the bathroom, became dizzy and nauseous and passed out striking the back of her head on the toilet. Pt husband states that pt was only out for a few seconds. Pt has not attempted to ambulate since the fall. Pt states that she "passes out often" because she does not tolerate pain well. Pt denies any weakness, blurry vision, vomiting, paresthesias. Pt is not anticoagulated. Unknown last tetanus.  HPI  Past Medical History:  Diagnosis Date  . Allergic rhinitis   . Anxiety disorder   . Asthma   . Depression    sees [facility] and Dr. Nolen Mu  . GERD (gastroesophageal reflux disease)   . UTI (urinary tract infection)     Patient Active Problem List   Diagnosis Date Noted  . ANXIETY 01/24/2010  . CHEST PAIN 01/22/2010  . ABDOMINAL PAIN, RIGHT LOWER QUADRANT 11/22/2009  . HEADACHE 12/26/2008  . CONTUSION OF BREAST 10/06/2008  . CONTUSION OF BACK 10/06/2008  . CONTUSION OF FOREARM 10/06/2008  . FOOT PAIN 09/21/2008  . Esophageal reflux 07/29/2007  . ASTHMATIC BRONCHITIS, ACUTE 07/08/2007  . PHARYNGITIS, ACUTE 12/26/2006  . DEPRESSION 08/06/2006  . ALLERGIC RHINITIS 08/06/2006  . Asthma 08/06/2006    Past Surgical History:  Procedure Laterality Date  . CESAREAN SECTION  2001  . CESAREAN SECTION  2003  . CHOLECYSTECTOMY    . NISSEN FUNDOPLICATION  07/2005   Dr. Daphine Deutscher  . TONSILLECTOMY    . TUBAL LIGATION    . VENTRAL  HERNIA REPAIR  1/09   Dr. Daphine Deutscher    OB History    No data available       Home Medications    Prior to Admission medications   Medication Sig Start Date End Date Taking? Authorizing Provider  albuterol (PROAIR HFA) 108 (90 Base) MCG/ACT inhaler Inhale two puffs by mouth every four hours as needed Patient taking differently: Inhale 1-2 puffs into the lungs every 4 (four) hours as needed for wheezing or shortness of breath.  09/22/15  Yes Nelwyn Salisbury, MD  cetirizine-pseudoephedrine (ZYRTEC-D) 5-120 MG per tablet Take 1 tablet by mouth daily.     Yes Historical Provider, MD  mometasone-formoterol (DULERA) 200-5 MCG/ACT AERO Inhale 2 puffs into the lungs 2 (two) times daily. 09/22/15  Yes Nelwyn Salisbury, MD  pantoprazole (PROTONIX) 40 MG tablet Take 1 tablet (40 mg total) by mouth daily. 09/22/15  Yes Nelwyn Salisbury, MD    Family History Family History  Problem Relation Age of Onset  . Alcohol abuse    . Arthritis    . Breast cancer    . Coronary artery disease    . Hypertension    . Rheum arthritis Mother   . Crohn's disease Sister   . Heart attack Paternal Grandfather     Social History Social History  Substance Use Topics  . Smoking status:  Never Smoker  . Smokeless tobacco: Never Used  . Alcohol use No     Allergies   Penicillins and Sulfonamide derivatives   Review of Systems Review of Systems  All other systems reviewed and are negative.    Physical Exam Updated Vital Signs BP 125/78   Pulse 104   Temp 98.6 F (37 C)   Resp 19   Ht 5\' 2"  (1.575 m)   Wt 74.8 kg   SpO2 100%   BMI 30.18 kg/m   Physical Exam  Constitutional: She is oriented to person, place, and time. She appears well-developed and well-nourished. No distress.  HENT:  Head: Normocephalic.  Nose: Nose normal.  Mouth/Throat: No oropharyngeal exudate.  1cm horizontal laceration to posterior scalp. NO sign of skull depression. No battle sign. No racoon eyes. No hemotympanum.   Eyes:  Conjunctivae and EOM are normal. Pupils are equal, round, and reactive to light. Right eye exhibits no discharge. Left eye exhibits no discharge. No scleral icterus.  Neck: Normal range of motion. Neck supple.  Cardiovascular: Normal rate, regular rhythm, normal heart sounds and intact distal pulses.  Exam reveals no gallop and no friction rub.   No murmur heard. Pulmonary/Chest: Effort normal and breath sounds normal. No respiratory distress. She has no wheezes. She has no rales. She exhibits no tenderness.  Abdominal: Soft. Bowel sounds are normal. She exhibits no distension. There is no tenderness. There is no guarding.  Musculoskeletal: Normal range of motion. She exhibits no edema.  Neurological: She is alert and oriented to person, place, and time. No cranial nerve deficit. She exhibits normal muscle tone. Coordination normal.  Strength 5/5 throughout. No sensory deficits. No gait abnormality. No dysmetria. No slurred speech. No facial droop.    Skin: Skin is warm and dry. No rash noted. She is not diaphoretic. No erythema. No pallor.  Psychiatric: She has a normal mood and affect. Her behavior is normal.  Nursing note and vitals reviewed.    ED Treatments / Results  Labs (all labs ordered are listed, but only abnormal results are displayed) Labs Reviewed  URINE CULTURE - Abnormal; Notable for the following:       Result Value   Culture >=100,000 COLONIES/mL KLEBSIELLA PNEUMONIAE (*)    All other components within normal limits  BASIC METABOLIC PANEL - Abnormal; Notable for the following:    Potassium 3.3 (*)    Glucose, Bld 103 (*)    Calcium 8.5 (*)    All other components within normal limits  CBC - Abnormal; Notable for the following:    WBC 16.1 (*)    All other components within normal limits  URINALYSIS, ROUTINE W REFLEX MICROSCOPIC (NOT AT [facility]) - Abnormal; Notable for the following:    APPearance CLOUDY (*)    Nitrite POSITIVE (*)    Leukocytes, UA SMALL (*)    All  other components within normal limits  URINE MICROSCOPIC-ADD ON - Abnormal; Notable for the following:    Squamous Epithelial / LPF 0-5 (*)    Bacteria, UA MANY (*)    All other components within normal limits  CBG MONITORING, ED  I-STAT TROPOININ, ED    EKG  EKG Interpretation  Date/Time:  Monday April 08 2016 05:35:44 EDT Ventricular Rate:  85 PR Interval:    QRS Duration: 90 QT Interval:  361 QTC Calculation: 430 R Axis:   47 Text Interpretation:  Sinus rhythm Confirmed by ZACKOWSKI  MD, SCOTT ([id number]) on 04/09/2016 12:11:43 PM  Radiology Ct Head Wo Contrast  Result Date: 04/08/2016 CLINICAL DATA:  Syncope with headache and fall. Dizziness and photophobia EXAM: CT HEAD WITHOUT CONTRAST TECHNIQUE: Contiguous axial images were obtained from the base of the skull through the vertex without intravenous contrast. COMPARISON:  February 22, 2010 FINDINGS: Brain: The ventricles are normal in size and configuration. There is no intracranial mass, hemorrhage, extra-axial fluid collection, or midline shift. Gray-white compartments are normal. No acute infarct evident. Vascular: There is no hyperdense vessel. There is a focus calcification in the distal left vertebral artery. Skull: The bony calvarium appears intact. There is a left parietal scalp hematoma. Sinuses/Orbits: Visualized paranasal sinuses are clear. There is rightward deviation of the nasal septum. Orbits appear symmetric bilaterally. Other: Mastoid air cells are clear. IMPRESSION: Left parietal scalp hematoma. No fracture evident. No intracranial mass, hemorrhage, or extra-axial fluid collection. Gray-white compartments appear normal. There is a small focus of calcification in the distal left vertebral artery. There is nasal septal deviation. Electronically Signed   By: Bretta Bang III M.D.   On: 04/08/2016 07:58    Procedures Procedures (including critical care time)  Medications Ordered in ED Medications  Tdap  (BOOSTRIX) injection 0.5 mL (0.5 mLs Intramuscular Given 04/08/16 0721)  prochlorperazine (COMPAZINE) injection 10 mg (10 mg Intravenous Given 04/08/16 0705)  diphenhydrAMINE (BENADRYL) injection 12.5 mg (12.5 mg Intravenous Given 04/08/16 0705)  meclizine (ANTIVERT) tablet 25 mg (25 mg Oral Given 04/08/16 0948)  sodium chloride 0.9 % bolus 500 mL (0 mLs Intravenous Stopped 04/08/16 1058)     Initial Impression / Assessment and Plan / ED Course  I have reviewed the triage vital signs and the nursing notes.  Pertinent labs & imaging results that were available during my care of the patient were reviewed by me and considered in my medical decision making (see chart for details).  Clinical Course   50 y.o F with a pmhx of anxiety, migraines presents to the ED after a syncopal episode that occurred today. Pt woke up this morning with a frontal HA that she states felt like her typical HA. Upon standing, felt dizzy and subsequently passed out causing a 1cm laceration to posterior scalp. On presentation to ED, Patient appears well. All vital signs are stable. Patient is complaining of dizziness with states that headache is minimal. No neurological deficits noted on exam. Per patient and husband, patient passes out often usually secondary to pain as she does not tolerate pain well. He states this is not new symptom for her and has been worked up previously. Patient given migraine cocktail including Compazine and Benadryl as well as additional fluids. CT head obtained which revealed left scalp hematoma but otherwise unremarkable. Scalp laceration repaired with 1 staple which patient tolerated well. Tetanus was also updated today. On reevaluation patient is sleepy which is likely due to the Benadryl. However, she still reports some dizziness. She was given meclizine with significant symptomatic relief. Patient had normal orthostatic vital signs and was able to ambulate independently without difficulty. CBC and CMP  unremarkable. Troponin 0.0. Urinalysis did show positive nitrite and many bacteria. However, patient is asymptomatic showing no dysuria, suprapubic pain pain, urgency or frequency at this time. Will send for culture and defer treatment at this time. The patient is safe for follow-up with PCP. Will DC with few doses of meclizine. Patient is to have her staple removed in 1 week. Return precautions outlined in patient discharge instructions.  Final Clinical Impressions(s) / ED Diagnoses   Final  diagnoses:  Laceration of scalp without foreign body, initial encounter  Syncope, unspecified syncope type  Nonintractable headache, unspecified chronicity pattern, unspecified headache type    New Prescriptions Discharge Medication List as of 04/08/2016 11:21 AM       Dub Mikes, PA-C 04/09/16 1628    Courteney Lyn Mackuen, MD 04/12/16 1610

## 2016-04-08 NOTE — ED Notes (Signed)
Pt ambulated in hall with little assist, reported some dizziness

## 2016-04-08 NOTE — ED Notes (Signed)
Bed: [id number] Expected date: 04/08/16 Expected time:  Means of arrival:  Comments: EMS syncope

## 2016-04-08 NOTE — ED Triage Notes (Signed)
Brought in by EMS from home with c/o syncope.  Pt reported that she woke up to her migraine headache and took Ibuprofen liquid and Exedrin, then went to the bathroom.  Pt stated that her headache was so severe that she "passed out" and fell backwards in the bathroom, sustaining small scalp laceration on the back of her head.  Pt also reports nausea and dizziness and light sensitivity.

## 2016-04-10 LAB — URINE CULTURE: Culture: >=100000 — AB

## 2016-04-11 ENCOUNTER — Telehealth: Payer: Self-pay | Admitting: Family Medicine

## 2016-04-11 ENCOUNTER — Ambulatory Visit (INDEPENDENT_AMBULATORY_CARE_PROVIDER_SITE_OTHER): Payer: BLUE CROSS/BLUE SHIELD | Admitting: Family Medicine

## 2016-04-11 VITALS — BP 104/79 | HR 80 | Temp 98.4°F | Ht 62.0 in | Wt 168.0 lb

## 2016-04-11 DIAGNOSIS — R55 Syncope and collapse: Secondary | ICD-10-CM

## 2016-04-11 DIAGNOSIS — S0101XD Laceration without foreign body of scalp, subsequent encounter: Secondary | ICD-10-CM | POA: Diagnosis not present

## 2016-04-11 DIAGNOSIS — N10 Acute tubulo-interstitial nephritis: Secondary | ICD-10-CM | POA: Diagnosis not present

## 2016-04-11 MED ORDER — CIPROFLOXACIN HCL 500 MG PO TABS: 500.0000 mg | ORAL_TABLET | Freq: Two times a day (BID) | ORAL | 0 refills | Status: DC

## 2016-04-11 NOTE — Telephone Encounter (Signed)
tient Name: [name] [name]  DOB: Feb 14, 1966    Initial Comment Caller was seen in the Er after head injury- has staple in her head. She is having a headache and pain.    Nurse Assessment  Nurse: Stefano GaulStringer, RN, Dwana CurdVera Date/Time (Eastern Time): 04/11/2016 8:24:48 AM  Confirm and document reason for call. If symptomatic, describe symptoms. You must click the next button to save text entered. ---Caller states she went to the ER for head injury on Monday am after a fall. Fell backwards and hit the tile. She had a laceration and had a staple Dizziness is better. Has a headache and she has taken advil. CT scan was clear. Headache is a little worse. Has pressure in the top of head.  Has the patient traveled out of the country within the last 30 days? ---Not Applicable  Does the patient have any new or worsening symptoms? ---Yes  Will a triage be completed? ---Yes  Related visit to physician within the last 2 weeks? ---Yes  Does the PT have any chronic conditions? (i.e. diabetes, asthma, etc.) ---No  Is the patient pregnant or possibly pregnant? (Ask all females between the ages of 612-55) ---No  Is this a behavioral health or substance abuse call? ---No     Guidelines    Guideline Title Affirmed Question Affirmed Notes  Head Injury [1] After 72 hours AND [2] headache persists    Final Disposition User   See PCP When Office is Open (within 3 days) Stefano GaulStringer, RN, Dwana CurdVera    Comments  appt scheduled for 11 am 04/11/2016 with Dr. Gershon CraneStephen Fry   Disagree/Comply: Comply

## 2016-04-11 NOTE — Telephone Encounter (Signed)
Please Advise appointment scheduled today

## 2016-04-11 NOTE — Progress Notes (Signed)
Pre visit review using our clinic review tool, if applicable. No additional management support is needed unless otherwise documented below in the visit note. 

## 2016-04-13 ENCOUNTER — Ambulatory Visit (INDEPENDENT_AMBULATORY_CARE_PROVIDER_SITE_OTHER): Payer: BLUE CROSS/BLUE SHIELD | Admitting: Family Medicine

## 2016-04-13 ENCOUNTER — Encounter: Payer: Self-pay | Admitting: Family Medicine

## 2016-04-13 VITALS — BP 108/78 | HR 75 | Temp 98.6°F | Resp 14 | Ht 62.0 in | Wt 167.0 lb

## 2016-04-13 DIAGNOSIS — S0990XD Unspecified injury of head, subsequent encounter: Secondary | ICD-10-CM | POA: Diagnosis not present

## 2016-04-13 DIAGNOSIS — H8110 Benign paroxysmal vertigo, unspecified ear: Secondary | ICD-10-CM

## 2016-04-13 DIAGNOSIS — R202 Paresthesia of skin: Secondary | ICD-10-CM

## 2016-04-13 DIAGNOSIS — D72829 Elevated white blood cell count, unspecified: Secondary | ICD-10-CM

## 2016-04-13 DIAGNOSIS — G44209 Tension-type headache, unspecified, not intractable: Secondary | ICD-10-CM

## 2016-04-13 MED ORDER — MECLIZINE HCL 25 MG PO TABS: 25.0000 mg | ORAL_TABLET | Freq: Three times a day (TID) | ORAL | 0 refills | Status: DC | PRN

## 2016-04-13 NOTE — Patient Instructions (Signed)
A few things to remember from today's visit:   Tension-type headache, not intractable, unspecified chronicity pattern  Benign paroxysmal positional vertigo, unspecified laterality - Plan: meclizine (ANTIVERT) 25 MG tablet  Tingling in extremities  Traumatic injury of head, subsequent encounter  Vertigo is usually benign. Monitor for sudden worsening. Monitor for worsening tingling, headache, onset of vomiting,confusion,fever among some.  Please follow with pcp in 1 week if not any better, before if needed.   Vestibular exercises: Semont maneuvers may help with dizziness. Fall precautions.   Please be sure medication list is accurate. If a new problem present, please set up appointment sooner than planned today.

## 2016-04-13 NOTE — Progress Notes (Signed)
Pre visit review using our clinic review tool, if applicable. No additional management support is needed unless otherwise documented below in the visit note. 

## 2016-04-13 NOTE — Progress Notes (Signed)
HPI:  ACUTE VISIT:  Chief Complaint  Patient presents with  . Headache    dizziness and pressure in head     [name] is a 50 y.o. female, who is here today with husband complaining of dizziness and headache.  She is reporting recent episode of syncope, 04/08/16; she was taken to ER via EMS and according to pt, work-up was done and otherwise negative.She was discharged after a few hours of observation.Occipital laceration was repair.  According to pt, she had LOC for a few seconds, denies seizure like activity,urinary/bowel incontinence. Prior to episode she was having headache and nausea.  Since fall she has had dizziness, "spinning " sensation, room is "up and down", exacerbated by turning head, lying in bed,"roling" in bed, or head movement. Alleviated by being still and breathing deep/slow. She denies prior Hx of vertigo. She got a Rx for Meclizine in the R and took medication until yesterday.   She denies hearing loss or tinnitus. Hx of allergic rhinitis.  - Parieto-temporal pressure like headache, constant, 2-3/10 in intensity. No associated visual changes, nausea, or vomiting. Also soreness on area of scalp laceration. No cervical pain, fever, or chills.  + Occasional tingling sensation on forearms,1-2 days, not sure if it is related to anxiety. No focal weakness or confusion.  She is also reporting Dx of UTI during ER visit, called in abx, Cipro (3rd day today). She denies any urinary symptom, abdominal pain, or back pain. She was told that her white blood count was elevated.  She is reporting Hx of headaches, parieto-temporal, pressure like. Hx of syncopal episodes, states that she has a "tendency to pass out"   04/08/16 Head CT:  Left parietal scalp hematoma. No fracture evident. No intracranial mass, hemorrhage, or extra-axial fluid collection. Gray-white compartments appear normal. There is a small focus of calcification in the distal left vertebral  artery. There is nasal septal deviation.  Lab Results  Component Value Date   WBC 16.1 (H) 04/08/2016   HGB 13.3 04/08/2016   HCT 39.7 04/08/2016   MCV 90.8 04/08/2016   PLT 279 04/08/2016   Lab Results  Component Value Date   CREATININE 0.87 04/08/2016   BUN 10 04/08/2016   NA 139 04/08/2016   K 3.3 (L) 04/08/2016   CL 108 04/08/2016   CO2 23 04/08/2016     Review of Systems  Constitutional: Negative for appetite change, fatigue, fever and unexpected weight change.  HENT: Negative for ear pain, facial swelling, hearing loss, mouth sores, nosebleeds, sore throat, trouble swallowing and voice change.   Eyes: Negative for photophobia, pain and visual disturbance.  Respiratory: Negative for cough, shortness of breath and wheezing.   Cardiovascular: Negative for chest pain, palpitations and leg swelling.  Gastrointestinal: Positive for nausea (resolved.). Negative for abdominal pain and vomiting.       No changes in bowel habits.  Genitourinary: Negative for decreased urine volume, difficulty urinating, dysuria, frequency, hematuria and vaginal bleeding.  Musculoskeletal: Negative for gait problem, myalgias and neck pain.  Skin: Negative for color change and rash.  Neurological: Positive for dizziness, syncope and headaches. Negative for tremors, seizures, speech difficulty and weakness.  Hematological: Negative for adenopathy. Does not bruise/bleed easily.  Psychiatric/Behavioral: Negative for confusion and sleep disturbance. The patient is nervous/anxious.       Current Outpatient Prescriptions on File Prior to Visit  Medication Sig Dispense Refill  . albuterol (PROAIR HFA) 108 (90 Base) MCG/ACT inhaler Inhale two puffs by  mouth every four hours as needed (Patient taking differently: Inhale 1-2 puffs into the lungs every 4 (four) hours as needed for wheezing or shortness of breath. ) 3 Inhaler 3  . cetirizine-pseudoephedrine (ZYRTEC-D) 5-120 MG per tablet Take 1 tablet by  mouth daily.      . mometasone-formoterol (DULERA) 200-5 MCG/ACT AERO Inhale 2 puffs into the lungs 2 (two) times daily. 3 Inhaler 3  . pantoprazole (PROTONIX) 40 MG tablet Take 1 tablet (40 mg total) by mouth daily. 90 tablet 3   No current facility-administered medications on file prior to visit.      Past Medical History:  Diagnosis Date  . Allergic rhinitis   . Anxiety disorder   . Asthma   . Depression    sees [facility] and Dr. Nolen Mu  . GERD (gastroesophageal reflux disease)   . UTI (urinary tract infection)    Allergies  Allergen Reactions  . Penicillins     REACTION: childhood - hives/rash Has patient had a PCN reaction causing immediate rash, facial/tongue/throat swelling, SOB or lightheadedness with hypotension: Yes Has patient had a PCN reaction causing severe rash involving mucus membranes or skin necrosis: Yes Has patient had a PCN reaction that required hospitalization No Has patient had a PCN reaction occurring within the last 10 years: No If all of the above answers are "NO", then may proceed with Cephalosporin use.   . Sulfonamide Derivatives     REACTION: childhood - hives/rash    Social History   Social History  . Marital status: Married    Spouse name: N/A  . Number of children: 2  . Years of education: N/A   Social History Main Topics  . Smoking status: Never Smoker  . Smokeless tobacco: Never Used  . Alcohol use No  . Drug use: No  . Sexual activity: Not Asked   Other Topics Concern  . None   Social History Narrative   Stay-at-home mom   Daily caffeine use 2          Vitals:   04/13/16 0959  BP: 108/78  Pulse: 75  Resp: 14  Temp: 98.6 F (37 C)   O2 sat at RA 97%  Body mass index is 30.54 kg/m.    Physical Exam  Nursing note and vitals reviewed. Constitutional: She is oriented to person, place, and time. She appears well-developed. She does not appear ill. No distress.  HENT:  Head: Atraumatic.  Right Ear:  Hearing, tympanic membrane, external ear and ear canal normal.  Left Ear: Hearing, tympanic membrane, external ear and ear canal normal.  Mouth/Throat: Oropharynx is clear and moist and mucous membranes are normal.  Apley maneuver did not tolerate, started with dizziness. Nystagmus not appreciated.  Eyes: Conjunctivae are normal. Pupils are equal, round, and reactive to light.  Neck: Full passive range of motion without pain. No JVD present. No tracheal deviation present.  Cardiovascular: Normal rate and regular rhythm.   No murmur heard. Respiratory: Effort normal and breath sounds normal. No respiratory distress.  GI: Soft. She exhibits no mass. There is no hepatomegaly. There is no tenderness.  Musculoskeletal: She exhibits no edema or tenderness.  Lymphadenopathy:    She has no cervical adenopathy.  Neurological: She is alert and oriented to person, place, and time. She has normal strength. No cranial nerve deficit or sensory deficit. Coordination and gait normal.  Pronator drift negative.  Skin: Skin is warm. Abrasion noted. No rash noted. No cyanosis or erythema.  Laceration on left  occipital area, healing well. Small hematoma palpated, mild tenderness with palpation, no local heat or erythema.  Psychiatric: Her speech is normal. Her mood appears anxious. Her affect is labile.  Well groomed, good eye contact.      ASSESSMENT AND PLAN:     Anvitha was seen today for headache.  Diagnoses and all orders for this visit:  Traumatic injury of head, subsequent encounter  I do not think another brain imaging is needed today. Neurologic examination negative today. Explained that complications form head trauma can be present later,so monitor for worsening or new symptoms. Instructed about warning signs.  Tension-type headache, not intractable, unspecified chronicity pattern  Headache she is describing today, parieto-temporal, seems to be chronic. Pain on area of laceration.trauma  is expected and can last a few weeks, monitor for worsening pain. Explained that headache can present after head trauma and last a few weeks.  Benign paroxysmal positional vertigo, unspecified laterality  "Spinning" sensation, suggest positional vertigo. Educated about Dx and clearly instructed about warning signs and fall precautions. Vestibular exercises may help and recommended, Semont modified maneuvers.  Meclizine refilled. F/U in 3 days with PCP, before if needed.   -     meclizine (ANTIVERT) 25 MG tablet; Take 1 tablet (25 mg total) by mouth 3 (three) times daily as needed for dizziness.  Tingling in extremities  Possible causes discussed: Cipro, anxiety, dehydration, electrolyte abnormality among some. Neurologic examination today negative. K+ slightly low during ER visit; may need to be re-checked during f/u visit. Adequate hydration,  I do not have availability for labs today. Clearly instructed about warning signs. Stop Cipro.  Leukocytosis, unspecified type  I am not clear about etiology. No symptoms of UTI. CBC may need to be re-checked.     Return in about 3 days (around 04/16/2016) for PCP vertigo and elevated wbc.     -[name] was advised to return or notify a doctor immediately if symptoms worsen or new concerns arise, otherwise she will follow as instructed. She voices understanding and agrees with plan. .       Jeanclaude Wentworth G. [place], MD  [facility]. [place] office.

## 2016-04-15 ENCOUNTER — Telehealth: Payer: Self-pay

## 2016-04-15 ENCOUNTER — Encounter: Payer: Self-pay | Admitting: Family Medicine

## 2016-04-15 NOTE — Telephone Encounter (Signed)
Pt was seen by Dr. SwazilandJordan in Saturday clinic   Patient Name: [name] [name] Gender: Female DOB: 11-Dec-1965 Age: 50 Y 3 M 24 D Return Phone Number: [phone] (Primary), [phone] (Secondary) Address: City/State/Zip: [place] Client [facility] Night - Client Client Site [facility] - Night Physician Gershon CraneFry, Stephen - MD Contact Type Call Who Is Calling Patient / Member / Family / Caregiver Call Type Triage / Clinical Relationship To Patient Self Return Phone Number [phone] (Primary) Chief Complaint FAINTING or PASSING OUT Reason for Call Symptomatic / Request for Health Information Initial Comment Caller states she is having dizzy spells and head pressure and she felt like she was going to pass out. She started a new medication. PreDisposition InappropriateToAsk Translation No Nurse Assessment Nurse: Waynetta SandyMoon, RN, Melissa Date/Time (Eastern Time): 04/12/2016 9:18:01 PM Confirm and document reason for call. If symptomatic, describe symptoms. You must click the next button to save text entered. ---Caller states that she is having head pressure and dizzy spells.She started taking cipro yesterday. She passed out Monday and hit her head. She was seen in the ER. Has the patient traveled out of the country within the last 30 days? ---No Does the patient have any new or worsening symptoms? ---Yes Will a triage be completed? ---Yes Related visit to physician within the last 2 weeks? ---Yes Does the PT have any chronic conditions? (i.e. diabetes, asthma, etc.) ---Yes List chronic conditions. ---Asthma Is the patient pregnant or possibly pregnant? (Ask all females between the ages of 7712-55) ---No Is this a behavioral health or substance abuse call? ---No Guideline Title Affirmed Question Affirmed Notes Nurse Date/Time (Eastern Time) Concussion (mTBI) Less Than 14 Days Ago Follow-up Call [1] Concussion symptoms staying SAME (not worse or  better) AND [2] present > 3 days Black OakMoon, RN, Melissa 04/12/2016 9:22:01 PM Disp. Time Lamount Cohen(Eastern Time) Disposition Final User 04/12/2016 9:09:53 PM Send to Urgent Queue Chyrel MassonHawk, Jessica 04/12/2016 9:32:51 PM See PCP When Office is Open (within 3 days) Yes Moon, Charity fundraiserN, Mohawk IndustriesMelissa Caller Understands: Yes Disagree/Comply: Comply Care Advice Given Per Guideline SEE PCP WITHIN 3 DAYS: * Headache, nausea, and feeling irritable and sleepy are common, especially during the first couple days after a concussion. CONCUSSION - SYMPTOMS: * Other symptoms of a concussion include amnesia (can't remember what happened), dizziness, difficulty concentrating or 'foggy' feeling, feeling tired, feeling dazed or not your normal self, and decreased coordination. CALL BACK IF: * Severe headache persists more than 2 hours after ice pack and pain medications * Vomiting occurs * Weakness of one arm or leg occurs * Slurred or garbled speech occurs * You become worse. CARE ADVICE given per Concussion Follow-Up (Adult) guideline.  Referrals GO TO FACILITY UNDECIDED

## 2016-04-15 NOTE — Progress Notes (Signed)
   Subjective:    Patient ID: [name] [name], female    DOB: 03/16/1966, 50 y.o.   MRN: [id number]  HPI Here to follow up an ER visit on 04-08-16 for syncope and a head injury. She woke up that morning with a frontal headache and she felt a generalized weakness. She denies any UTI symptoms. No chest pain or SOB. As she got up out of bed to go to the bathroom she felt a sudden faintness and apparently briefly lost consciousness. She fell in the bathroom, striking to top of the head on the back of the toilet. She quickly regained consciousness and was assisted by her husband, who heard the fall. She had a laceration to the scalp but otherwise seemed okay. At the ER labs revealed a UTI, and it was felt that she had a combination of a UTI and dehydration that had caused her to become syncopal. Her exam was normal except for a scalp laceration that was closed with a single staple. A CT scan of the head was normal. She was placed on Cipro and given IV fluids. The urine culture grew Klebsiella that is sensitive to Cipro. Now she feels fine with no lightheadedness or headache or any other symptoms.    Review of Systems  Constitutional: Negative.   HENT: Negative.   Eyes: Negative.   Respiratory: Negative.   Cardiovascular: Negative.   Gastrointestinal: Negative.   Genitourinary: Negative.   Skin: Positive for wound.  Neurological: Negative.        Objective:   Physical Exam  Constitutional: She is oriented to person, place, and time. She appears well-developed and well-nourished. No distress.  Neck: Normal range of motion. Neck supple. No thyromegaly present.  Cardiovascular: Normal rate, regular rhythm, normal heart sounds and intact distal pulses.   Pulmonary/Chest: Effort normal and breath sounds normal.  Lymphadenopathy:    She has no cervical adenopathy.  Neurological: She is alert and oriented to person, place, and time. No cranial nerve deficit. She exhibits normal muscle tone.  Coordination normal.  Skin:  Small closed laceration on the scalp vertex with a single staple.           Assessment & Plan:  She had a syncopal episode apparently caused by dehydration and a UTI, and she has felt no lightheadedness since treatment. She had a scalp laceration that is healing and the staple was removed today. The UTI was treated appropriately and seems to have resolved. Follow up prn.  Nelwyn SalisburyFRY,STEPHEN A, MD

## 2016-04-16 ENCOUNTER — Ambulatory Visit: Payer: BLUE CROSS/BLUE SHIELD | Admitting: Family Medicine

## 2016-04-16 ENCOUNTER — Encounter: Payer: Self-pay | Admitting: Family Medicine

## 2016-04-16 ENCOUNTER — Ambulatory Visit (INDEPENDENT_AMBULATORY_CARE_PROVIDER_SITE_OTHER): Payer: BLUE CROSS/BLUE SHIELD | Admitting: Family Medicine

## 2016-04-16 VITALS — BP 118/76 | HR 85 | Temp 98.5°F | Ht 62.0 in | Wt 169.0 lb

## 2016-04-16 DIAGNOSIS — H8110 Benign paroxysmal vertigo, unspecified ear: Secondary | ICD-10-CM

## 2016-04-16 MED ORDER — MECLIZINE HCL 25 MG PO TABS: 25.0000 mg | ORAL_TABLET | Freq: Four times a day (QID) | ORAL | 2 refills | Status: DC | PRN

## 2016-04-16 NOTE — Progress Notes (Signed)
Pre visit review using our clinic review tool, if applicable. No additional management support is needed unless otherwise documented below in the visit note. 

## 2016-04-16 NOTE — Progress Notes (Signed)
   Subjective:    Patient ID: [name] [name], female    DOB: February 09, 1966, 50 y.o.   MRN: [id number]  HPI Here to follow up on dizziness. We saw her on 04-11-16 as a follow up to an ER visit last weekend. She had developed a headache at home and had a syncopal spell that resulted in a fall. She struck the back of he head on the toilet and had a small scalp laceration. There was no LOC. At the ER she had a CT scan of the head which was unremarkable. She has had a headache since then but it has improved markedly. Since the fall she has also developed dizziness that is consistent with vertigo. She feels like the room spins if she moves her head quickly or if she rolls over in bed. She was found to have a UTI in the ER (with no symptoms) and we started her on Cipro. She then got more dizzy over the past weekend and [facility]. She was told to stop the Cipro in case it was playing a role (which she did after taking only 4 doses). She was given Meclizine and this has been helpful. She still has the vertigo symptoms but these are controlled with Meclizine. No new symptoms.    Review of Systems  Constitutional: Negative.   Eyes: Negative.   Respiratory: Negative.   Cardiovascular: Negative.   Neurological: Positive for dizziness and headaches. Negative for tremors, seizures, syncope, facial asymmetry, speech difficulty, weakness, light-headedness and numbness.       Objective:   Physical Exam  Constitutional: She is oriented to person, place, and time. She appears well-developed and well-nourished. No distress.  Eyes: Conjunctivae and EOM are normal. Pupils are equal, round, and reactive to light.  Neck: No thyromegaly present.  Cardiovascular: Normal rate, regular rhythm, normal heart sounds and intact distal pulses.   Pulmonary/Chest: Effort normal and breath sounds normal.  Lymphadenopathy:    She has no cervical adenopathy.  Neurological: She is alert and oriented to person,  place, and time. No cranial nerve deficit. She exhibits normal muscle tone. Coordination normal.          Assessment & Plan:  She has had no further syncopal spells since last weekend and it seems likely this was mostly due to dehydration. She has no UTI symptoms so we agreed to stay off antibiotics for now. She has vestibular vertigo and I asked her to drink plenty of fluids, primarily water. She can use Meclizine every 6 hours prn. Recheck as needed.  Nelwyn SalisburyFRY,Tyronda Vizcarrondo A, MD

## 2016-05-17 ENCOUNTER — Ambulatory Visit: Payer: BLUE CROSS/BLUE SHIELD | Admitting: Family Medicine

## 2016-05-20 ENCOUNTER — Encounter (HOSPITAL_COMMUNITY): Payer: Self-pay | Admitting: Emergency Medicine

## 2016-05-20 ENCOUNTER — Ambulatory Visit: Payer: Self-pay | Admitting: Family Medicine

## 2016-05-20 ENCOUNTER — Emergency Department (HOSPITAL_COMMUNITY)
Admission: EM | Admit: 2016-05-20 | Discharge: 2016-05-20 | Disposition: A | Discharge status: Home / Self Care | Payer: Managed Care, Other (non HMO) | Attending: Emergency Medicine | Admitting: Emergency Medicine

## 2016-05-20 DIAGNOSIS — M542 Cervicalgia: Secondary | ICD-10-CM | POA: Insufficient documentation

## 2016-05-20 DIAGNOSIS — J45909 Unspecified asthma, uncomplicated: Secondary | ICD-10-CM | POA: Insufficient documentation

## 2016-05-20 DIAGNOSIS — R1013 Epigastric pain: Secondary | ICD-10-CM | POA: Insufficient documentation

## 2016-05-20 DIAGNOSIS — R0789 Other chest pain: Secondary | ICD-10-CM | POA: Diagnosis not present

## 2016-05-20 DIAGNOSIS — R079 Chest pain, unspecified: Secondary | ICD-10-CM | POA: Diagnosis present

## 2016-05-20 LAB — URINALYSIS, ROUTINE W REFLEX MICROSCOPIC
Bilirubin Urine: NEGATIVE
Glucose, UA: NEGATIVE mg/dL
Ketones, ur: NEGATIVE mg/dL
Leukocytes, UA: NEGATIVE
Nitrite: NEGATIVE
Protein, ur: NEGATIVE mg/dL
Specific Gravity, Urine: 1.003 — ABNORMAL LOW (ref 1.005–1.030)
pH: 6 (ref 5.0–8.0)

## 2016-05-20 LAB — BASIC METABOLIC PANEL
Anion gap: 7 (ref 5–15)
BUN: 6 mg/dL (ref 6–20)
CO2: 27 mmol/L (ref 22–32)
Calcium: 9.2 mg/dL (ref 8.9–10.3)
Chloride: 106 mmol/L (ref 101–111)
Creatinine, Ser: 0.75 mg/dL (ref 0.44–1.00)
GFR calc Af Amer: >60 mL/min (ref >60)
GFR calc non Af Amer: >60 mL/min (ref >60)
Glucose, Bld: 103 mg/dL — ABNORMAL HIGH (ref 65–99)
Potassium: 4 mmol/L (ref 3.5–5.1)
Sodium: 140 mmol/L (ref 135–145)

## 2016-05-20 LAB — CBC WITH DIFFERENTIAL/PLATELET
Basophils Absolute: 0 10*3/uL (ref 0.0–0.1)
Basophils Relative: 0 %
Eosinophils Absolute: 0.1 10*3/uL (ref 0.0–0.7)
Eosinophils Relative: 1 %
HCT: 38.4 % (ref 36.0–46.0)
Hemoglobin: 13 g/dL (ref 12.0–15.0)
Lymphocytes Relative: 19 %
Lymphs Abs: 1.7 10*3/uL (ref 0.7–4.0)
MCH: 30.2 pg (ref 26.0–34.0)
MCHC: 33.9 g/dL (ref 30.0–36.0)
MCV: 89.3 fL (ref 78.0–100.0)
Monocytes Absolute: 0.7 10*3/uL (ref 0.1–1.0)
Monocytes Relative: 8 %
Neutro Abs: 6.5 10*3/uL (ref 1.7–7.7)
Neutrophils Relative %: 72 %
Platelets: 282 10*3/uL (ref 150–400)
RBC: 4.3 MIL/uL (ref 3.87–5.11)
RDW: 12.2 % (ref 11.5–15.5)
WBC: 8.9 10*3/uL (ref 4.0–10.5)

## 2016-05-20 LAB — CBG MONITORING, ED: Glucose-Capillary: 112 mg/dL — ABNORMAL HIGH (ref 65–99)

## 2016-05-20 LAB — URINE MICROSCOPIC-ADD ON
Bacteria, UA: NONE SEEN
RBC / HPF: NONE SEEN RBC/hpf (ref 0–5)

## 2016-05-20 LAB — TROPONIN I: Troponin I: <0.03 ng/mL (ref <0.03)

## 2016-05-20 NOTE — ED Triage Notes (Signed)
Pt in from home with L neck pain/pressure that woke her up at 0330. Pt states pain also began in her shoulders, pt took 2 Advil PTA. Also reports epigastric pain that began 45 min ago. States some nausea, no vomiting. A&Ox4

## 2016-05-20 NOTE — ED Provider Notes (Signed)
[facility]-EMERGENCY DEPT Provider Note   CSN: [id number] Arrival date & time: 05/20/16  0515     History   Chief Complaint Chief Complaint  Patient presents with  . Neck Pain  . Abdominal Pain    HPI [name] [name] is a 50 y.o. female.  Patient is 50 yo F with PMH of anxiety, presenting with chief complaint of dull neck pain starting today at 3:30 AM. She recently had a syncopal episode on October 2nd in which she fell and hit her head, triggering vertigo; however, her dizziness has been progressively improving. She has a mild headache, but denies any worsening dizziness, vision changes, numbness, weakness, syncope, seizures, or difficulty ambulating. Her neck pain has improved after taking 2 Advil this morning. She also reports chest "tightness" at onset of neck pain this morning, and mild epigastric pain, but denies any chest pains radiating to arm, neck, or jaw, shortness of breath, N/V/D, change in bowel habits, blood in stool, or dysuria. She denies any cardiac history, hypertenison, diabetes, or stroke. Patient commented this "may be related to my anxiety." She has f/u appointment with PCP today at 4:15, who has been managing her symptoms of vertigo.      Past Medical History:  Diagnosis Date  . Allergic rhinitis   . Anxiety disorder   . Asthma   . Depression    [facility] Nolen [facility]ney  . GERD (gastroesophageal reflux disease)   . UTI (urinary tract infection)     Patient Active Problem List   Diagnosis Date Noted  . ANXIETY 01/24/2010  . CHEST PAIN 01/22/2010  . ABDOMINAL PAIN, RIGHT LOWER QUADRANT 11/22/2009  . HEADACHE 12/26/2008  . CONTUSION OF BREAST 10/06/2008  . CONTUSION OF BACK 10/06/2008  . CONTUSION OF FOREARM 10/06/2008  . FOOT PAIN 09/21/2008  . Esophageal reflux 07/29/2007  . ASTHMATIC BRONCHITIS, ACUTE 07/08/2007  . PHARYNGITIS, ACUTE 12/26/2006  . DEPRESSION 08/06/2006  . ALLERGIC RHINITIS 08/06/2006  . Asthma 08/06/2006    Past  Surgical History:  Procedure Laterality Date  . CESAREAN SECTION  2001  . CESAREAN SECTION  2003  . CHOLECYSTECTOMY    . NISSEN FUNDOPLICATION  07/2005   Dr. Daphine DeutscherMartin  . TONSILLECTOMY    . TUBAL LIGATION    . VENTRAL HERNIA REPAIR  1/09   Dr. Daphine DeutscherMartin    OB History    No data available       Home Medications    Prior to Admission medications   Medication Sig Start Date End Date Taking? Authorizing Provider  albuterol (PROAIR HFA) 108 (90 Base) MCG/ACT inhaler Inhale two puffs by mouth every four hours as needed Patient taking differently: Inhale 1-2 puffs into the lungs every 4 (four) hours as needed for wheezing or shortness of breath.  09/22/15   Nelwyn SalisburyStephen A Fry, MD  cetirizine-pseudoephedrine (ZYRTEC-D) 5-120 MG per tablet Take 1 tablet by mouth daily.      Historical Provider, MD  meclizine (ANTIVERT) 25 MG tablet Take 1 tablet (25 mg total) by mouth every 6 (six) hours as needed for dizziness. 04/16/16   Nelwyn SalisburyStephen A Fry, MD  mometasone-formoterol (DULERA) 200-5 MCG/ACT AERO Inhale 2 puffs into the lungs 2 (two) times daily. 09/22/15   Nelwyn SalisburyStephen A Fry, MD  pantoprazole (PROTONIX) 40 MG tablet Take 1 tablet (40 mg total) by mouth daily. 09/22/15   Nelwyn SalisburyStephen A Fry, MD    Family History Family History  Problem Relation Age of Onset  . Alcohol abuse    .  Arthritis    . Breast cancer    . Coronary artery disease    . Hypertension    . Rheum arthritis Mother   . Crohn's disease Sister   . Heart attack Paternal Grandfather     Social History Social History  Substance Use Topics  . Smoking status: Never Smoker  . Smokeless tobacco: Never Used  . Alcohol use No     Allergies   Penicillins and Sulfonamide derivatives   Review of Systems Review of Systems  Constitutional: Negative for chills and fever.  HENT: Negative for ear pain and sore throat.   Eyes: Negative for pain and visual disturbance.  Respiratory: Negative for cough and shortness of breath.   Cardiovascular:  Positive for chest pain. Negative for palpitations and leg swelling.  Gastrointestinal: Positive for abdominal pain (epigastric). Negative for blood in stool, nausea and vomiting.  Genitourinary: Negative for dysuria, flank pain and hematuria.  Musculoskeletal: Positive for neck pain. Negative for back pain.  Skin: Negative for color change and rash.  Neurological: Positive for dizziness (mild dizziness, at baseline). Negative for seizures, syncope, weakness, numbness and headaches.     Physical Exam Updated Vital Signs BP 125/84   Pulse 91   Temp 97.5 F (36.4 C) (Oral)   Resp 18   Ht 5\' 2"  (1.575 m)   Wt 74.8 kg   LMP 05/19/2016   SpO2 99%   BMI 30.18 kg/m   Physical Exam  Constitutional: She is oriented to person, place, and time. She appears well-developed and well-nourished. No distress.  HENT:  Head: Normocephalic and atraumatic.  Mouth/Throat: Oropharynx is clear and moist.  Eyes: Conjunctivae and EOM are normal. Pupils are equal, round, and reactive to light.  Neck: Normal range of motion. Neck supple.  No midline cervical tenderness, crepitus, or deformity. Mild TTP of trapezius musculature.  Cardiovascular: Normal rate, regular rhythm, normal heart sounds and intact distal pulses.   Pulmonary/Chest: Effort normal and breath sounds normal. No respiratory distress.  Abdominal: Soft. Bowel sounds are normal. She exhibits no distension. There is tenderness (mild epigastric tenderness only). There is no guarding.  Musculoskeletal: Normal range of motion. She exhibits no edema or tenderness.  Neurological: She is alert and oriented to person, place, and time.  Speech is clear and goal oriented, follows commands. Cranial nerves III - XII without deficit, no facial droop. Normal strength in upper and lower extremities bilaterally, strong and equal grip strength. Sensation normal to light and sharp touch. Moves extremities without ataxia, coordination intact. Normal finger  to nose and rapid alternating movements. Negative Romberg, no pronator drift. Normal gait.  Skin: Skin is warm and dry.  Psychiatric: She has a normal mood and affect.  Nursing note and vitals reviewed.    ED Treatments / Results  Labs (all labs ordered are listed, but only abnormal results are displayed) Labs Reviewed  BASIC METABOLIC PANEL - Abnormal; Notable for the following:       Result Value   Glucose, Bld 103 (*)    All other components within normal limits  URINALYSIS, ROUTINE W REFLEX MICROSCOPIC (NOT AT [facility]) - Abnormal; Notable for the following:    Specific Gravity, Urine 1.003 (*)    Hgb urine dipstick MODERATE (*)    All other components within normal limits  URINE MICROSCOPIC-ADD ON - Abnormal; Notable for the following:    Squamous Epithelial / LPF 0-5 (*)    All other components within normal limits  CBG MONITORING, ED - Abnormal;  Notable for the following:    Glucose-Capillary 112 (*)    All other components within normal limits  CBC WITH DIFFERENTIAL/PLATELET  TROPONIN I    EKG  EKG Interpretation  Date/Time:  Monday May 20 2016 06:44:17 EST Ventricular Rate:  76 PR Interval:    QRS Duration: 89 QT Interval:  372 QTC Calculation: 419 R Axis:   34 Text Interpretation:  Sinus rhythm Low voltage, precordial leads Otherwise within normal limits When compared with ECG of 04/08/2016, No significant change was found Confirmed by [facility]  MD, DAVID ([id number]) on 05/20/2016 7:03:32 AM       Radiology No results found.  Procedures Procedures (including critical care time)  Medications Ordered in ED Medications - No data to display   Initial Impression / Assessment and Plan / ED Course  I have reviewed the triage vital signs and the nursing notes.  Pertinent labs & imaging results that were available during my care of the patient were reviewed by me and considered in my medical decision making (see chart for details).  Clinical Course    Patient  is 50 yo F presenting with dull neck pain and chest tightness starting today at 3:30 AM. No cardiac history or chest pains radiating into arm, neck, or jaw. Also reports syncopal episode on October 2nd, with increasing anxiety since event. She has mild TTP trapezius musculature but no midline cervical tenderness. Physical exam otherwise unremarkable with normal neuro exam. EKG NSR and troponin normal. CBC, BMP, and urinalysis normal. Neck pain is likely musculoskeletal and chest tightness may be related to anxiety. Stable for d/c home and advised to keep f/u appointment with her PCP today at 4:15. Return precautions discussed for new or worsening chest pain, palpitations, shortness of breath, headaches, dizziness, vision changes, or difficulty walking.   Final Clinical Impressions(s) / ED Diagnoses   Final diagnoses:  Neck pain  Atypical chest pain    New Prescriptions New Prescriptions   No medications on file     Jari Pigg II, [place] 05/20/16 6045    Dione Booze, MD 05/27/16 1447

## 2016-05-20 NOTE — Discharge Instructions (Signed)
Your neck pain is likely musculoskeletal. Please take 400 mg OTC ibuprofen as needed for relief of your pain, and keep your appointment with your PCP today for reevaluation.

## 2016-05-20 NOTE — ED Notes (Signed)
ED Provider at bedside. 

## 2016-05-21 ENCOUNTER — Encounter: Payer: Self-pay | Admitting: Adult Health

## 2016-05-21 ENCOUNTER — Ambulatory Visit: Payer: Self-pay | Admitting: Family Medicine

## 2016-05-21 ENCOUNTER — Ambulatory Visit (INDEPENDENT_AMBULATORY_CARE_PROVIDER_SITE_OTHER): Payer: Managed Care, Other (non HMO) | Admitting: Adult Health

## 2016-05-21 DIAGNOSIS — H8110 Benign paroxysmal vertigo, unspecified ear: Secondary | ICD-10-CM | POA: Diagnosis not present

## 2016-05-21 MED ORDER — MECLIZINE HCL 25 MG PO TABS: 25.0000 mg | ORAL_TABLET | Freq: Four times a day (QID) | ORAL | 2 refills | Status: DC | PRN

## 2016-05-21 NOTE — Patient Instructions (Addendum)
It was great meeting you today!  Continue with meclizine  Someone from PT will call you to schedule your appointment.   Try the home exercises  Follow up as needed   Benign Positional Vertigo Introduction Vertigo is the feeling that you or your surroundings are moving when they are not. Benign positional vertigo is the most common form of vertigo. The cause of this condition is not serious (is benign). This condition is triggered by certain movements and positions (is positional). This condition can be dangerous if it occurs while you are doing something that could endanger you or others, such as driving. What are the causes? In many cases, the cause of this condition is not known. It may be caused by a disturbance in an area of the inner ear that helps your brain to sense movement and balance. This disturbance can be caused by a viral infection (labyrinthitis), head injury, or repetitive motion. What increases the risk? This condition is more likely to develop in:  Women.  People who are 48 years of age or older. What are the signs or symptoms? Symptoms of this condition usually happen when you move your head or your eyes in different directions. Symptoms may start suddenly, and they usually last for less than a minute. Symptoms may include:  Loss of balance and falling.  Feeling like you are spinning or moving.  Feeling like your surroundings are spinning or moving.  Nausea and vomiting.  Blurred vision.  Dizziness.  Involuntary eye movement (nystagmus). Symptoms can be mild and cause only slight annoyance, or they can be severe and interfere with daily life. Episodes of benign positional vertigo may return (recur) over time, and they may be triggered by certain movements. Symptoms may improve over time. How is this diagnosed? This condition is usually diagnosed by medical history and a physical exam of the head, neck, and ears. You may be referred to a health care provider  who specializes in ear, nose, and throat (ENT) problems (otolaryngologist) or a provider who specializes in disorders of the nervous system (neurologist). You may have additional testing, including:  MRI.  A CT scan.  Eye movement tests. Your health care provider may ask you to change positions quickly while he or she watches you for symptoms of benign positional vertigo, such as nystagmus. Eye movement may be tested with an electronystagmogram (ENG), caloric stimulation, the Dix-Hallpike test, or the roll test.  An electroencephalogram (EEG). This records electrical activity in your brain.  Hearing tests. How is this treated? Usually, your health care provider will treat this by moving your head in specific positions to adjust your inner ear back to normal. Surgery may be needed in severe cases, but this is rare. In some cases, benign positional vertigo may resolve on its own in 2-4 weeks. Follow these instructions at home: Safety  Move slowly.Avoid sudden body or head movements.  Avoid driving.  Avoid operating heavy machinery.  Avoid doing any tasks that would be dangerous to you or others if a vertigo episode would occur.  If you have trouble walking or keeping your balance, try using a cane for stability. If you feel dizzy or unstable, sit down right away.  Return to your normal activities as told by your health care provider. Ask your health care provider what activities are safe for you. General instructions  Take over-the-counter and prescription medicines only as told by your health care provider.  Avoid certain positions or movements as told by your health care  provider.  Drink enough fluid to keep your urine clear or pale yellow.  Keep all follow-up visits as told by your health care provider. This is important. Contact a health care provider if:  You have a fever.  Your condition gets worse or you develop new symptoms.  Your family or friends notice any  behavioral changes.  Your nausea or vomiting gets worse.  You have numbness or a "pins and needles" sensation. Get help right away if:  You have difficulty speaking or moving.  You are always dizzy.  You faint.  You develop severe headaches.  You have weakness in your legs or arms.  You have changes in your hearing or vision.  You develop a stiff neck.  You develop sensitivity to light. This information is not intended to replace advice given to you by your health care provider. Make sure you discuss any questions you have with your health care provider. Document Released: 04/01/2006 Document Revised: 11/30/2015 Document Reviewed: 10/17/2014  2017 Elsevier

## 2016-05-21 NOTE — Progress Notes (Signed)
Subjective:    Patient ID: [name] [name], female    DOB: March 04, 1966, 50 y.o.   MRN: [id number]  HPI  50 year old female, patient of Dr. Clent RidgesFry who I am seeing today for the first time for vertigo. She had a fall back in October that resulted in her hitting the back of her head on the toilet and she had a small laceration. There was no LOC. She had an CT of her head in the ER which was unremarkable. After that she had developed dizziness that was consistent with vertigo. She feels like the room spins if she moves her head quickly or if she rolls over in bed. She was prescribed Meclizine by her PCP which she endorses helps  She was seen in the ER yesterday  with chief complaint of dull neck pain starting today at 3:30 AM.She also reported chest "tightness" at onset of neck pain this morning, and mild epigastric pain, but denied any chest pains radiating to arm, neck, or jaw, shortness of breath, N/V/D, change in bowel habits, blood in stool, or dysuria.  Her Exam in the ER, including lab work and EKG were unremarkable.   Today in the office she reports that she feels " better since yesterday." But continues to have vertigo like symptoms, especially when laying in bed and moving directions.   Review of Systems  Constitutional: Positive for activity change.  HENT: Negative.   Respiratory: Negative.   Musculoskeletal: Negative.   Skin: Negative.   Neurological: Positive for dizziness and headaches.  Psychiatric/Behavioral: Negative.   All other systems reviewed and are negative.  Past Medical History:  Diagnosis Date  . Allergic rhinitis   . Anxiety disorder   . Asthma   . Depression    [facility]. Nolen MuMcKinney  . GERD (gastroesophageal reflux disease)   . UTI (urinary tract infection)     Social History   Social History  . Marital status: Married    Spouse name: N/A  . Number of children: 2  . Years of education: N/A   Occupational History  . Not on file.    Social History Main Topics  . Smoking status: Never Smoker  . Smokeless tobacco: Never Used  . Alcohol use No  . Drug use: No  . Sexual activity: Not on file   Other Topics Concern  . Not on file   Social History Narrative   Stay-at-home mom   Daily caffeine use 2          Past Surgical History:  Procedure Laterality Date  . CESAREAN SECTION  2001  . CESAREAN SECTION  2003  . CHOLECYSTECTOMY    . NISSEN FUNDOPLICATION  07/2005   Dr. Daphine DeutscherMartin  . TONSILLECTOMY    . TUBAL LIGATION    . VENTRAL HERNIA REPAIR  1/09   Dr. Daphine DeutscherMartin    Family History  Problem Relation Age of Onset  . Alcohol abuse    . Arthritis    . Breast cancer    . Coronary artery disease    . Hypertension    . Rheum arthritis Mother   . Crohn's disease Sister   . Heart attack Paternal Grandfather     Allergies  Allergen Reactions  . Penicillins     REACTION: childhood - hives/rash Has patient had a PCN reaction causing immediate rash, facial/tongue/throat swelling, SOB or lightheadedness with hypotension: Yes Has patient had a PCN reaction causing severe rash involving mucus membranes or skin necrosis:  Yes Has patient had a PCN reaction that required hospitalization No Has patient had a PCN reaction occurring within the last 10 years: No If all of the above answers are "NO", then may proceed with Cephalosporin use.   . Sulfonamide Derivatives     REACTION: childhood - hives/rash    Current Outpatient Prescriptions on File Prior to Visit  Medication Sig Dispense Refill  . albuterol (PROAIR HFA) 108 (90 Base) MCG/ACT inhaler Inhale two puffs by mouth every four hours as needed (Patient taking differently: Inhale 1-2 puffs into the lungs every 4 (four) hours as needed for wheezing or shortness of breath. ) 3 Inhaler 3  . cetirizine-pseudoephedrine (ZYRTEC-D) 5-120 MG per tablet Take 1 tablet by mouth daily.      . mometasone-formoterol (DULERA) 200-5 MCG/ACT AERO Inhale 2 puffs into the lungs 2  (two) times daily. 3 Inhaler 3  . pantoprazole (PROTONIX) 40 MG tablet Take 1 tablet (40 mg total) by mouth daily. 90 tablet 3   No current facility-administered medications on file prior to visit.     BP 108/64   Temp 98.2 F (36.8 C) (Oral)   Ht 5\' 2"  (1.575 m)   Wt 167 lb 9.6 oz (76 kg)   LMP 05/19/2016   BMI 30.65 kg/m       Objective:   Physical Exam  Constitutional: She is oriented to person, place, and time. She appears well-developed and well-nourished. No distress.  HENT:  Head: Normocephalic and atraumatic.  Right Ear: External ear normal.  Left Ear: External ear normal.  Nose: Nose normal.  Mouth/Throat: Oropharynx is clear and moist. No oropharyngeal exudate.  Eyes: Pupils are equal, round, and reactive to light. Right eye exhibits no discharge. Right eye exhibits nystagmus. Left eye exhibits nystagmus.  Neck: Normal range of motion. Neck supple. No thyromegaly present.  Cardiovascular: Normal rate, regular rhythm, normal heart sounds and intact distal pulses.  Exam reveals no gallop and no friction rub.   No murmur heard. Pulmonary/Chest: Effort normal and breath sounds normal. No respiratory distress. She has no wheezes. She has no rales.  Musculoskeletal: Normal range of motion. She exhibits no edema, tenderness or deformity.  Lymphadenopathy:    She has no cervical adenopathy.  Neurological: She is alert and oriented to person, place, and time.  Skin: Skin is warm and dry. No rash noted. She is not diaphoretic. No erythema. No pallor.  Psychiatric: She has a normal mood and affect. Her behavior is normal. Judgment and thought content normal.  Nursing note and vitals reviewed.     Assessment & Plan:  1. Benign paroxysmal positional vertigo, unspecified laterality - Exam consistent with BPPV.  - Home eply maneuver handout given  - PT vestibular rehab; Future - meclizine (ANTIVERT) 25 MG tablet; Take 1 tablet (25 mg total) by mouth every 6 (six) hours as  needed for dizziness.  Dispense: 60 tablet; Refill: 2 - Follow up as needed  Shirline Freesory Chloeanne Poteet, NP

## 2016-06-06 ENCOUNTER — Other Ambulatory Visit: Payer: Self-pay | Admitting: Adult Health

## 2016-06-06 ENCOUNTER — Telehealth: Payer: Self-pay | Admitting: Family Medicine

## 2016-06-06 DIAGNOSIS — H8113 Benign paroxysmal vertigo, bilateral: Secondary | ICD-10-CM

## 2016-06-06 NOTE — Telephone Encounter (Signed)
I am sorry for the delay. I have replaced the order and I have the referral coordinator getting her in as soon as possible. If she feels like she needs to follow up with her PCP that is completely fine

## 2016-06-06 NOTE — Telephone Encounter (Signed)
Please change the order in the system to a referral. This cannot be scheduled as it is.  Pt has been waiting 2 weeks and now states she is worse, and the symptoms have brought on anxiety, nausea, and head congestion. Would like to know if she needs to come in or can we get her in to see vestibular rehab asap?

## 2016-06-06 NOTE — Telephone Encounter (Signed)
Patient notified of Cory's comments and verbalized understanding.  

## 2016-06-06 NOTE — Telephone Encounter (Signed)
Please advise 

## 2016-06-10 ENCOUNTER — Ambulatory Visit (INDEPENDENT_AMBULATORY_CARE_PROVIDER_SITE_OTHER): Payer: Managed Care, Other (non HMO) | Admitting: Family Medicine

## 2016-06-10 VITALS — BP 100/71 | HR 80 | Temp 98.0°F | Ht 62.0 in | Wt 169.0 lb

## 2016-06-10 DIAGNOSIS — H8110 Benign paroxysmal vertigo, unspecified ear: Secondary | ICD-10-CM

## 2016-06-10 DIAGNOSIS — F411 Generalized anxiety disorder: Secondary | ICD-10-CM

## 2016-06-10 MED ORDER — DIAZEPAM 5 MG PO TABS: 5.0000 mg | ORAL_TABLET | Freq: Three times a day (TID) | ORAL | 2 refills | Status: DC | PRN

## 2016-06-10 NOTE — Progress Notes (Signed)
Pre visit review using our clinic review tool, if applicable. No additional management support is needed unless otherwise documented below in the visit note. 

## 2016-06-11 ENCOUNTER — Telehealth: Payer: Self-pay | Admitting: Family Medicine

## 2016-06-11 ENCOUNTER — Encounter: Payer: Self-pay | Admitting: Family Medicine

## 2016-06-11 DIAGNOSIS — H8110 Benign paroxysmal vertigo, unspecified ear: Secondary | ICD-10-CM | POA: Insufficient documentation

## 2016-06-11 NOTE — Progress Notes (Signed)
   Subjective:    Patient ID: [name] [name], female    DOB: 1966/04/16, 50 y.o.   MRN: [id number]  HPI Here to follow up on vertigo. She was recently seen for classic positional vertigo, and she was given Meclizine. This helps slightly but not much. She has also been referred for vestibular rehab, which will begin soon. However she thinks anxiety is playing a large role in this as well. She has been very anxious for several months and she has periodic spells of overwhelming anxiety such that she thinks she is going to die. Her heart rate goes up and she feels like her is pounding in her chest. This seems to relate back to her recent fall at home where she struck the back of her head on the toilet. She has been treated in the past with Zoloft, but she feels more panicky now than ever before. When she feels dizzy the anxiety seems to hit her harder. Sleep and appetite are normal.    Review of Systems  Constitutional: Negative.   Respiratory: Positive for chest tightness. Negative for cough, shortness of breath and wheezing.   Cardiovascular: Positive for palpitations. Negative for chest pain and leg swelling.  Neurological: Positive for dizziness. Negative for headaches.  Psychiatric/Behavioral: Negative for agitation, behavioral problems, confusion, decreased concentration, dysphoric mood and hallucinations. The patient is nervous/anxious.        Objective:   Physical Exam  Constitutional: She is oriented to person, place, and time. She appears well-developed and well-nourished. No distress.  HENT:  Head: Normocephalic and atraumatic.  Eyes: Conjunctivae and EOM are normal. Pupils are equal, round, and reactive to light.  Neck: No thyromegaly present.  Cardiovascular: Normal rate, regular rhythm, normal heart sounds and intact distal pulses.   Pulmonary/Chest: Effort normal and breath sounds normal.  Lymphadenopathy:    She has no cervical adenopathy.  Neurological: She is alert and  oriented to person, place, and time. No cranial nerve deficit. She exhibits normal muscle tone. Coordination normal.  Psychiatric: Her behavior is normal. Judgment and thought content normal.  Anxious           Assessment & Plan:  She has a combination of vertigo and also anxiety. She has what may be termed panic attacks, and these seemed linked to her dizzy spells. She will try Valium 5 mg TID, amd this should help with both issues. Start vestibular rehab as planned. Recheck in 2 weeks. Nelwyn SalisburyFRY,STEPHEN A, MD

## 2016-06-11 NOTE — Telephone Encounter (Signed)
Yes I would take the Diazepam twice every day, but add the Meclizine when needed

## 2016-06-11 NOTE — Telephone Encounter (Signed)
I spoke with pt and went over below information. 

## 2016-06-11 NOTE — Telephone Encounter (Signed)
Pt would like to know if she can take the diazepam at night and during the day if dizzy can she take meclizine?

## 2016-06-18 ENCOUNTER — Ambulatory Visit: Payer: Managed Care, Other (non HMO) | Attending: Adult Health

## 2016-06-18 DIAGNOSIS — R2689 Other abnormalities of gait and mobility: Secondary | ICD-10-CM

## 2016-06-18 DIAGNOSIS — H8113 Benign paroxysmal vertigo, bilateral: Secondary | ICD-10-CM | POA: Diagnosis not present

## 2016-06-18 NOTE — Therapy (Signed)
[facility] [address] [place], [place], [address] Phone: [phone]   Fax:  [phone]  Physical Therapy Evaluation  Patient Details  Name: [name] [name] F Kief MRN: [id number] Date of Birth: 08-17-1965 Referring Provider: Shirline Freesory Nafziger  Encounter Date: 06/18/2016      PT End of Session - 06/18/16 1041    Visit Number 1   Number of Visits 7   Date for PT Re-Evaluation 07/18/16   Authorization Type [employer]   PT Start Time 0801   PT Stop Time 0845   PT Time Calculation (min) 44 min   Activity Tolerance Patient tolerated treatment well   Behavior During Therapy [facility] for tasks assessed/performed      Past Medical History:  Diagnosis Date  . Allergic rhinitis   . Anxiety disorder   . Asthma   . Depression    [facility]. Nolen MuMcKinney  . GERD (gastroesophageal reflux disease)   . UTI (urinary tract infection)     Past Surgical History:  Procedure Laterality Date  . CESAREAN SECTION  2001  . CESAREAN SECTION  2003  . CHOLECYSTECTOMY    . NISSEN FUNDOPLICATION  07/2005   Dr. Daphine DeutscherMartin  . TONSILLECTOMY    . TUBAL LIGATION    . VENTRAL HERNIA REPAIR  1/09   Dr. Daphine DeutscherMartin    There were no vitals filed for this visit.       Subjective Assessment - 06/18/16 0807    Subjective Pt reported dizziness began after falling in October 2017 and hit the back of her head. Pt reports she went to ED and testing was negative for fx's or brain injury. Pt states the dizziness has caused anxiety and is now taking Valium and meclizine. Pt reports dizziness is worse when lying down, especially rolling to the R side, and while looking up and down.  Pt has been drinking more water and has reduced caffeine intake. Pt describes dizziness as a spinning sensation, at worst: was a 10/10 and now is 5-6/10 at worst, 0/10 at best. Pt also has nausea with dizziness.    Patient is accompained by: Family member  husband: Lin(sp?)   Pertinent History Asthma, anxiety, depression   Patient Stated Goals I want to function and get rid of dizziness and anxiety.    Currently in Pain? No/denies            [facility] PT Assessment - 06/18/16 [id number]      Assessment   Medical Diagnosis BPPV   Referring Provider Shirline Freesory Nafziger   Onset Date/Surgical Date 04/08/16   Hand Dominance Right   Prior Therapy none for dizziness     Precautions   Precautions Fall     Restrictions   Weight Bearing Restrictions No     Balance Screen   Has the patient fallen in the past 6 months Yes   How many times? 1   Has the patient had a decrease in activity level because of a fear of falling?  Yes  cautious   Is the patient reluctant to leave their home because of a fear of falling?  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      Sensation   Additional Comments Pt states she's had N/T in B UE/LE when she had a panic attack.     Ambulation/Gait   Ambulation/Gait Yes   Ambulation/Gait Assistance 5: Supervision   Ambulation/Gait Assistance Details No LOB but pt guarded.   Ambulation Distance (Feet) 50 Feet   Assistive device None   Gait Pattern Step-through  pattern;Decreased stride length;Decreased trunk rotation   Ambulation Surface Level;Indoor            Vestibular Assessment - 06/18/16 0817      Symptom Behavior   Type of Dizziness Spinning   Frequency of Dizziness Every day   Duration of Dizziness A few seconds   Aggravating Factors Turning head quickly;Lying supine;Looking up to the ceiling;Forward bending;Rolling to right   Relieving Factors Rest;Closing eyes     Occulomotor Exam   Occulomotor Alignment Normal   Spontaneous Absent   Gaze-induced Absent   Smooth Pursuits Intact   Saccades Intact   Comment No dizziness reported     Vestibulo-Occular Reflex   VOR 1 Head Only (x 1 viewing) WFL, no dizziness.     Positional Testing   Dix-Hallpike Dix-Hallpike Right;Dix-Hallpike Left   Horizontal Canal Testing Horizontal Canal Right;Horizontal Canal Left     Dix-Hallpike Right   Dix-Hallpike Right Duration <30 sec.    Dix-Hallpike Right Symptoms Upbeat, right rotatory nystagmus  8-9/10 dizziness     Dix-Hallpike Left   Dix-Hallpike Left Duration One minute   Dix-Hallpike Left Symptoms Upbeat, left rotatory nystagmus  3/10 dizziness     Horizontal Canal Right   Horizontal Canal Right Duration none   Horizontal Canal Right Symptoms Normal     Horizontal Canal Left   Horizontal Canal Left Duration none   Horizontal Canal Left Symptoms Normal     Positional Sensitivities   Supine to Sitting Mild dizziness                Vestibular Treatment/Exercise - 06/18/16 0833      Vestibular Treatment/Exercise   Vestibular Treatment Provided Canalith Repositioning   Canalith Repositioning Epley Manuever Right      EPLEY MANUEVER RIGHT   Number of Reps  1   Overall Response Symptoms Resolved   Response Details  No dizziness or nystagmus after treatment.                PT Education - 06/18/16 1041    Education provided Yes   Education Details PT discussed BPPV, including demo of testing and  treatment. PT discussed frequency/duration of POC. PT asked pt to not take meclizines as tolerated prior to treatment, as meclizine was prescribed prn. PT educated pt on f/u with regarding pt asking if she should continue valium.    Person(s) Educated Patient;Spouse   Methods Explanation;Demonstration   Comprehension Verbalized understanding          PT Short Term Goals - 06/18/16 1046      PT SHORT TERM GOAL #1  Title same as LTGs           PT Long Term Goals - 06/18/16 1047      PT LONG TERM GOAL #1   Title Pt will be IND in HEP to improve balance. TARGET DATE FOR ALL LTGS: 07/16/16   Status New     PT LONG TERM GOAL #2   Title Pt will report 0/10 during all positional testing in order to perform ADLs and gait safely.    Status New     PT LONG TERM GOAL #3   Title Pt will improve DHI from 50% to 32% to improve quality of life.    Status New     PT LONG TERM GOAL #4   Title Perform FGA and write goal prn.    Status New     PT LONG TERM GOAL #5   Title Pt will perform all bed mobility with 0/10 dizziness in order to sleep through the night.   Status New               Plan - 06/18/16 1042    Clinical Impression Statement Pt is a pleasant 50y/o female presenting to OPPT neuro for BPPV. Pt's PMH signficant for the following : anxiety, depression, and asthma. Pt presented with the following deficits: dizziness, gait deviations, and impaired balance. B Dix-Hallpike tests were positive, with incr. intensity (dizziness and nystagmus amplitude) on R side, indicating pt likely has B pBPPV. B horizontal testing negative. Pt reported 0/10 dizziness and no nystagmus noted after R Epley treatment. PT will formally assess balance next session, as time constraints did not allow testing upon eval.    Rehab Potential Good   Clinical Impairments Affecting Rehab Potential anxiety   PT Frequency --  2x/week for 2 weeks, then 1x/week for 2 weeks   PT Duration --  see above   PT  Treatment/Interventions ADLs/Self Care Home Management;Canalith Repostioning;Biofeedback;Vestibular;Manual techniques;Patient/family education;Gait training;Stair training;DME Instruction;Functional mobility training;Therapeutic activities;Therapeutic exercise;Balance training;Neuromuscular re-education   PT Next Visit Plan Re-test for R pBPPV and treat prn, treat L pBPPV. Perform FGA and write goal prn.   Consulted and Agree with Plan of Care Patient      Patient will benefit from skilled therapeutic intervention in order to improve the following deficits and impairments:  Abnormal gait, Dizziness, Decreased balance  Visit Diagnosis: BPPV (benign paroxysmal positional vertigo), bilateral - Plan: PT plan of care cert/re-cert  Other abnormalities of gait and mobility - Plan: PT plan of care cert/re-cert     Problem List Patient Active Problem List   Diagnosis Date Noted  . Benign paroxysmal positional vertigo 06/11/2016  . Anxiety state 01/24/2010  . CHEST PAIN 01/22/2010  . ABDOMINAL PAIN, RIGHT LOWER QUADRANT 11/22/2009  . HEADACHE 12/26/2008  . CONTUSION OF BREAST 10/06/2008  . CONTUSION OF BACK 10/06/2008  . CONTUSION OF FOREARM 10/06/2008  . FOOT PAIN 09/21/2008  . Esophageal reflux 07/29/2007  . ASTHMATIC BRONCHITIS, ACUTE 07/08/2007  . PHARYNGITIS, ACUTE 12/26/2006  . DEPRESSION 08/06/2006  . ALLERGIC RHINITIS 08/06/2006  . Asthma 08/06/2006    Harsimran Westman L 06/18/2016, 10:51 AM  [facility] [address] [place], [place], [address] Phone: [phone]   Fax:  [phone]  Name: DESHAWNA MCNEECE MRN: [id number] Date of Birth: January 17, 1966  Zerita Boers, PT,DPT 06/18/16 10:54 AM Phone: [phone] Fax: [phone]

## 2016-06-21 ENCOUNTER — Ambulatory Visit: Payer: Managed Care, Other (non HMO)

## 2016-06-26 ENCOUNTER — Ambulatory Visit: Payer: Managed Care, Other (non HMO) | Admitting: Physical Therapy

## 2016-06-26 DIAGNOSIS — H8113 Benign paroxysmal vertigo, bilateral: Secondary | ICD-10-CM | POA: Diagnosis not present

## 2016-06-26 DIAGNOSIS — R2689 Other abnormalities of gait and mobility: Secondary | ICD-10-CM

## 2016-06-26 NOTE — Therapy (Addendum)
[facility] [address] [place], [place], [address] Phone: [phone]   Fax:  [phone]  Physical Therapy Treatment  Patient Details  Name: [name] [name] MRN: [id number] Date of Birth: [birth year] Referring Provider: Dorothyann Peng  Encounter Date: 06/26/2016      PT End of Session - 06/26/16 1504    Visit Number 2   Authorization Type Cign[employer]   PT Start Time 5361   PT Stop Time 1510   PT Time Calculation (min) 28 min   Activity Tolerance Patient tolerated treatment well   Behavior During Therapy [facility] for tasks assessed/performed      Past Medical History:  Diagnosis Date  . Allergic rhinitis   . Anxiety disorder   . Asthma   . Depression    sees [facility] and Dr. Caprice Beaver  . GERD (gastroesophageal reflux disease)   . UTI (urinary tract infection)     Past Surgical History:  Procedure Laterality Date  . CESAREAN SECTION  2001  . CESAREAN SECTION  2003  . CHOLECYSTECTOMY    . NISSEN FUNDOPLICATION  10/4313   Dr. Hassell Done  . TONSILLECTOMY    . TUBAL LIGATION    . VENTRAL HERNIA REPAIR  1/09   Dr. Hassell Done    There were no vitals filed for this visit.      Subjective Assessment - 06/26/16 1444    Subjective reports dizziness is pretty much gone.     Patient Stated Goals I want to function and get rid of dizziness and anxiety.             [facility] PT Assessment - 06/26/16 1500      Observation/Other Assessments   Focus on Therapeutic Outcomes (FOTO)  97 (3% limited)   Dizziness Handicap Inventory (DHI)  0     Functional Gait  Assessment   Gait assessed  Yes   Gait Level Surface Walks 20 ft in less than 5.5 sec, no assistive devices, good speed, no evidence for imbalance, normal gait pattern, deviates no more than 6 in outside of the 12 in walkway width.   Change in Gait Speed Able to smoothly change walking speed without loss of balance or gait deviation. Deviate no more than 6 in outside  of the 12 in walkway width.   Gait with Horizontal Head Turns Performs head turns smoothly with slight change in gait velocity (eg, minor disruption to smooth gait path), deviates 6-10 in outside 12 in walkway width, or uses an assistive device.   Gait with Vertical Head Turns Performs head turns with no change in gait. Deviates no more than 6 in outside 12 in walkway width.   Gait and Pivot Turn Pivot turns safely within 3 sec and stops quickly with no loss of balance.   Step Over Obstacle Is able to step over 2 stacked shoe boxes taped together (9 in total height) without changing gait speed. No evidence of imbalance.   Gait with Narrow Base of Support Is able to ambulate for 10 steps heel to toe with no staggering.   Gait with Eyes Closed Walks 20 ft, no assistive devices, good speed, no evidence of imbalance, normal gait pattern, deviates no more than 6 in outside 12 in walkway width. Ambulates 20 ft in less than 7 sec.   Ambulating Backwards Walks 20 ft, no assistive devices, good speed, no evidence for imbalance, normal gait   Steps Alternating feet, no rail.   Total Score 29  Vestibular Assessment - 06/26/16 1503      Positional Testing   Dix-Hallpike Dix-Hallpike Right;Dix-Hallpike Left   Horizontal Canal Testing Horizontal Canal Right;Horizontal Canal Left     Dix-Hallpike Right   Dix-Hallpike Right Duration none   Dix-Hallpike Right Symptoms No nystagmus     Dix-Hallpike Left   Dix-Hallpike Left Duration none   Dix-Hallpike Left Symptoms No nystagmus     Horizontal Canal Right   Horizontal Canal Right Duration none   Horizontal Canal Right Symptoms Normal     Horizontal Canal Left   Horizontal Canal Left Duration none   Horizontal Canal Left Symptoms Normal                         PT Education - 06/26/16 1503    Education provided Yes   Education Details hold PT x 30 days; call office if vertigo returns   Person(s) Educated Patient    Methods Explanation   Comprehension Verbalized understanding          PT Short Term Goals - 06/18/16 1046      PT SHORT TERM GOAL #1   Title same as LTGs           PT Long Term Goals - 06/26/16 1504      PT LONG TERM GOAL #1   Title Pt will be IND in HEP to improve balance. TARGET DATE FOR ALL LTGS: 07/16/16   Baseline vertigo resolved after single session; no indication for HEP   Status Deferred     PT LONG TERM GOAL #2   Title Pt will report 0/10 during all positional testing in order to perform ADLs and gait safely.    Status Achieved     PT LONG TERM GOAL #3   Title Pt will improve DHI from 50% to 32% to improve quality of life.    Status Achieved     PT LONG TERM GOAL #4   Title Perform FGA and write goal prn.    Baseline FGA 29/30; no indication for goal to be set   Status Achieved     PT LONG TERM GOAL #5   Title Pt will perform all bed mobility with 0/10 dizziness in order to sleep through the night.   Status Achieved               Plan - 06/26/16 1505    Clinical Impression Statement All vertigo resolved at this time, will hold chart x 30 days in case vertigo returns.  FGA 29/30 indicating no significant fall risk.    PT Treatment/Interventions ADLs/Self Care Home Management;Canalith Repostioning;Biofeedback;Vestibular;Manual techniques;Patient/family education;Gait training;Stair training;DME Instruction;Functional mobility training;Therapeutic activities;Therapeutic exercise;Balance training;Neuromuscular re-education   PT Next Visit Plan hold x 30 days; reassess if needed.   Consulted and Agree with Plan of Care Patient      Patient will benefit from skilled therapeutic intervention in order to improve the following deficits and impairments:  Abnormal gait, Dizziness, Decreased balance  Visit Diagnosis: BPPV (benign paroxysmal positional vertigo), bilateral  Other abnormalities of gait and mobility     Problem List Patient Active Problem  List   Diagnosis Date Noted  . Benign paroxysmal positional vertigo 06/11/2016  . Anxiety state 01/24/2010  . CHEST PAIN 01/22/2010  . ABDOMINAL PAIN, RIGHT LOWER QUADRANT 11/22/2009  . HEADACHE 12/26/2008  . CONTUSION OF BREAST 10/06/2008  . CONTUSION OF BACK 10/06/2008  . CONTUSION OF FOREARM 10/06/2008  . FOOT PAIN 09/21/2008  .  Esophageal reflux 07/29/2007  . ASTHMATIC BRONCHITIS, ACUTE 07/08/2007  . PHARYNGITIS, ACUTE 12/26/2006  . DEPRESSION 08/06/2006  . ALLERGIC RHINITIS 08/06/2006  . Asthma 08/06/2006    Laureen Abrahams, PT, DPT 06/26/2016, 3:12 PM  [facility] [address] [place], [place], [address] Phone: [phone]   Fax:  [phone]  Name: [name] [name] MRN: [id number] Date of Birth: 10-21-1965      PHYSICAL THERAPY DISCHARGE SUMMARY  Visits from Start of Care: 2  Current functional level related to goals / functional outcomes: See above; all goals met   Remaining deficits: n/a   Education / Equipment: BPPV  Plan: Patient agrees to discharge.  Patient goals were met. Patient is being discharged due to meeting the stated rehab goals.  ?????      Laureen Abrahams, PT, DPT 07/29/16 10:01 AM  [facility] [address] [place], [place] [address]  [phone] (office) [phone] (fax)

## 2016-07-03 ENCOUNTER — Encounter: Payer: Managed Care, Other (non HMO) | Admitting: Physical Therapy

## 2016-07-11 ENCOUNTER — Telehealth: Payer: Self-pay | Admitting: Family Medicine

## 2016-07-11 NOTE — Telephone Encounter (Signed)
Pt would like to see if Dr. Clent RidgesFry could/would adjust her diazepam she is waking up 5 hours after taking it and would like to know if she could take a 1/2 pill so that she can get some sleep.   Pt would like to have a call.

## 2016-07-12 NOTE — Telephone Encounter (Signed)
I left a voice message for pt with below information.  

## 2016-07-12 NOTE — Telephone Encounter (Signed)
Also sent a patient a my chart message with this information.

## 2016-07-12 NOTE — Telephone Encounter (Signed)
She could take an extra 1/2 of a pill at bedtime if she wishes. Call in #[phone] with 5 rf

## 2016-07-15 ENCOUNTER — Encounter: Payer: Self-pay | Admitting: Family Medicine

## 2016-07-15 ENCOUNTER — Ambulatory Visit (INDEPENDENT_AMBULATORY_CARE_PROVIDER_SITE_OTHER): Payer: Managed Care, Other (non HMO) | Admitting: Family Medicine

## 2016-07-15 VITALS — BP 98/60 | HR 80 | Temp 97.4°F | Ht 62.0 in | Wt 169.0 lb

## 2016-07-15 DIAGNOSIS — F411 Generalized anxiety disorder: Secondary | ICD-10-CM | POA: Diagnosis not present

## 2016-07-15 NOTE — Progress Notes (Signed)
Pre visit review using our clinic review tool, if applicable. No additional management support is needed unless otherwise documented below in the visit note. 

## 2016-07-15 NOTE — Progress Notes (Signed)
   Subjective:    Patient ID: [name] [name], female    DOB: July 22, 1965, 51 y.o.   MRN: [id number]  HPI Here to discuss her anxiety. She has been taking a Valium at bedtime for a few weeks but this has not been working well. She wakes up a lot and then lies in bed and worries about things. Taking an extra half tablet has helped. She has arranged to meet with a therapist as well. She denies any depression symptoms.    Review of Systems  Constitutional: Negative.   Respiratory: Negative.   Cardiovascular: Negative.   Neurological: Negative.   Psychiatric/Behavioral: Positive for sleep disturbance. Negative for agitation, behavioral problems, confusion, decreased concentration, dysphoric mood and hallucinations. The patient is nervous/anxious.        Objective:   Physical Exam  Constitutional: She is oriented to person, place, and time. She appears well-developed and well-nourished.  Cardiovascular: Normal rate, regular rhythm, normal heart sounds and intact distal pulses.   Pulmonary/Chest: Effort normal and breath sounds normal.  Neurological: She is alert and oriented to person, place, and time.  Psychiatric: She has a normal mood and affect. Her behavior is normal. Judgment and thought content normal.          Assessment & Plan:  Anxiety, partially treated. I again recommended she try an SSRI but she declined due to fear of side effects. She will increase the Valium to one and 1/2 tabs (7.5 mg) every evening. I encouraged her to see the therapist and to exercise regularly.  Gershon CraneStephen Fry, MD

## 2016-07-19 ENCOUNTER — Other Ambulatory Visit (INDEPENDENT_AMBULATORY_CARE_PROVIDER_SITE_OTHER): Payer: Managed Care, Other (non HMO)

## 2016-07-19 DIAGNOSIS — Z0000 Encounter for general adult medical examination without abnormal findings: Secondary | ICD-10-CM | POA: Diagnosis not present

## 2016-07-19 LAB — HEPATIC FUNCTION PANEL
ALT: 22 U/L (ref 0–35)
AST: 17 U/L (ref 0–37)
Albumin: 4 g/dL (ref 3.5–5.2)
Alkaline Phosphatase: 56 U/L (ref 39–117)
Bilirubin, Direct: 0.1 mg/dL (ref 0.0–0.3)
Total Bilirubin: 0.6 mg/dL (ref 0.2–1.2)
Total Protein: 6.3 g/dL (ref 6.0–8.3)

## 2016-07-19 LAB — POC URINALSYSI DIPSTICK (AUTOMATED)
Bilirubin, UA: NEGATIVE
Blood, UA: NEGATIVE
Glucose, UA: NEGATIVE
Ketones, UA: NEGATIVE
Leukocytes, UA: NEGATIVE
Nitrite, UA: NEGATIVE
Protein, UA: NEGATIVE
Spec Grav, UA: 1.015
Urobilinogen, UA: 1
pH, UA: 6

## 2016-07-19 LAB — CBC WITH DIFFERENTIAL/PLATELET
Basophils Absolute: 0 10*3/uL (ref 0.0–0.1)
Basophils Relative: 0.5 % (ref 0.0–3.0)
Eosinophils Absolute: 0.2 10*3/uL (ref 0.0–0.7)
Eosinophils Relative: 2.1 % (ref 0.0–5.0)
HCT: 38 % (ref 36.0–46.0)
Hemoglobin: 13.1 g/dL (ref 12.0–15.0)
Lymphocytes Relative: 30.2 % (ref 12.0–46.0)
Lymphs Abs: 2.3 10*3/uL (ref 0.7–4.0)
MCHC: 34.4 g/dL (ref 30.0–36.0)
MCV: 90.3 fl (ref 78.0–100.0)
Monocytes Absolute: 0.7 10*3/uL (ref 0.1–1.0)
Monocytes Relative: 8.6 % (ref 3.0–12.0)
Neutro Abs: 4.4 10*3/uL (ref 1.4–7.7)
Neutrophils Relative %: 58.6 % (ref 43.0–77.0)
Platelets: 287 10*3/uL (ref 150.0–400.0)
RBC: 4.21 Mil/uL (ref 3.87–5.11)
RDW: 12.3 % (ref 11.5–15.5)
WBC: 7.6 10*3/uL (ref 4.0–10.5)

## 2016-07-19 LAB — BASIC METABOLIC PANEL
BUN: 11 mg/dL (ref 6–23)
CO2: 28 mEq/L (ref 19–32)
Calcium: 9.4 mg/dL (ref 8.4–10.5)
Chloride: 105 mEq/L (ref 96–112)
Creatinine, Ser: 0.76 mg/dL (ref 0.40–1.20)
GFR: 86.12 mL/min (ref >60.00)
Glucose, Bld: 93 mg/dL (ref 70–99)
Potassium: 4.1 mEq/L (ref 3.5–5.1)
Sodium: 141 mEq/L (ref 135–145)

## 2016-07-19 LAB — LIPID PANEL
Cholesterol: 198 mg/dL (ref 0–200)
HDL: 71.4 mg/dL (ref >39.00)
LDL Cholesterol: 118 mg/dL — ABNORMAL HIGH (ref 0–99)
NonHDL: 126.28
Total CHOL/HDL Ratio: 3
Triglycerides: 41 mg/dL (ref 0.0–149.0)
VLDL: 8.2 mg/dL (ref 0.0–40.0)

## 2016-07-19 LAB — TSH: TSH: 2.76 u[IU]/mL (ref 0.35–4.50)

## 2016-08-06 ENCOUNTER — Ambulatory Visit (INDEPENDENT_AMBULATORY_CARE_PROVIDER_SITE_OTHER): Payer: Managed Care, Other (non HMO) | Admitting: Family Medicine

## 2016-08-06 ENCOUNTER — Encounter: Payer: Self-pay | Admitting: Family Medicine

## 2016-08-06 VITALS — BP 109/77 | HR 86 | Temp 98.3°F | Ht 62.0 in | Wt 172.0 lb

## 2016-08-06 DIAGNOSIS — M940 Chondrocostal junction syndrome [Tietze]: Secondary | ICD-10-CM

## 2016-08-06 NOTE — Progress Notes (Signed)
Pre visit review using our clinic review tool, if applicable. No additional management support is needed unless otherwise documented below in the visit note. 

## 2016-08-06 NOTE — Progress Notes (Signed)
   Subjective:    Patient ID: [name] [name], female    DOB: 03-Mar-1966, 51 y.o.   MRN: [id number]  HPI Here for the onset last night of aching pains in the left upper arm and shoulder, as well as pain in the anterior chest. These pains come and go, but Ibuprofen helps. No SOB or nausea or cough.    Review of Systems  Constitutional: Negative.   HENT: Negative.   Eyes: Negative.   Respiratory: Negative.   Cardiovascular: Positive for chest pain. Negative for palpitations and leg swelling.  Musculoskeletal: Positive for arthralgias.  Neurological: Negative.        Objective:   Physical Exam  Constitutional: She is oriented to person, place, and time. She appears well-developed and well-nourished. No distress.  Neck: No thyromegaly present.  Cardiovascular: Normal rate, regular rhythm, normal heart sounds and intact distal pulses.   Pulmonary/Chest: Effort normal and breath sounds normal. No respiratory distress. She has no wheezes. She has no rales.  She is tender along both sternocostal margins and over the xiphoid. Also tender in the left lateral upper arm. No swelling.   Abdominal: Soft. Bowel sounds are normal. She exhibits no distension and no mass. There is no tenderness. There is no rebound and no guarding.  Lymphadenopathy:    She has no cervical adenopathy.  Neurological: She is alert and oriented to person, place, and time.          Assessment & Plan:  Costochondritis. I reassured her that this is a benign process and should resolve on its own over the next few days. Use Ibuprofen prn. Recheck prn.  Gershon CraneStephen Fry, MD

## 2016-09-21 ENCOUNTER — Encounter: Payer: Self-pay | Admitting: Internal Medicine

## 2016-09-21 ENCOUNTER — Ambulatory Visit (INDEPENDENT_AMBULATORY_CARE_PROVIDER_SITE_OTHER): Payer: Managed Care, Other (non HMO) | Admitting: Internal Medicine

## 2016-09-21 VITALS — BP 120/86 | HR 67 | Temp 98.3°F | Wt 168.0 lb

## 2016-09-21 DIAGNOSIS — J4520 Mild intermittent asthma, uncomplicated: Secondary | ICD-10-CM

## 2016-09-21 DIAGNOSIS — R079 Chest pain, unspecified: Secondary | ICD-10-CM

## 2016-09-21 MED ORDER — PREDNISONE 10 MG PO TABS: ORAL_TABLET | ORAL | 0 refills | Status: DC

## 2016-09-21 MED ORDER — AZITHROMYCIN 250 MG PO TABS: ORAL_TABLET | ORAL | 0 refills | Status: DC

## 2016-09-21 NOTE — Progress Notes (Signed)
Patient ID: [name] [name], female   DOB: Oct 09, 1965, 51 y.o.   MRN: [id number]   Subjective:    Patient ID: [name] LemmaPaula F Bowdish, female    DOB: Oct 09, 1965, 51 y.o.   MRN: [id number]  HPI  Patient here as a work in with concerns regarding sore throat and chest tightness.  She has a history asthma.  Reports feels similar to her previous asthma flares.  Noticed a few days ago, she started noticing her ears felt full.  Some increased drainage.  Some cough.  She then developed some hoarseness.  Fel some tightness in her chest.  She is able to swallow ok.  No acid reflux.  States she does have some episodes of increased heart rate at times.  PCP gave her valium.  She rarely uses now.  Used one last night.  States felt anxious when this progressed.  Has taken mucinex.  Tolerates prednisone. Had decreased her dulera dose recently.    Past Medical History:  Diagnosis Date  . Allergic rhinitis   . Anxiety disorder   . Asthma   . Depression    [facility] Nolen Mu[facility]Kinney  . GERD (gastroesophageal reflux disease)   . UTI (urinary tract infection)    Past Surgical History:  Procedure Laterality Date  . CESAREAN SECTION  2001  . CESAREAN SECTION  2003  . CHOLECYSTECTOMY    . NISSEN FUNDOPLICATION  07/2005   Dr. Daphine DeutscherMartin  . TONSILLECTOMY    . TUBAL LIGATION    . VENTRAL HERNIA REPAIR  1/09   Dr. Daphine DeutscherMartin   Family History  Problem Relation Age of Onset  . Alcohol abuse    . Arthritis    . Breast cancer    . Coronary artery disease    . Hypertension    . Rheum arthritis Mother   . Crohn's disease Sister   . Heart attack Paternal Grandfather    Social History   Social History  . Marital status: Married    Spouse name: N/A  . Number of children: 2  . Years of education: N/A   Social History Main Topics  . Smoking status: Never Smoker  . Smokeless tobacco: Never Used  . Alcohol use No  . Drug use: No  . Sexual activity: Not Asked   Other Topics Concern  . None   Social  History Narrative   Stay-at-home mom   Daily caffeine use 2          Outpatient Encounter Prescriptions as of 09/21/2016  Medication Sig  . albuterol (PROAIR HFA) 108 (90 Base) MCG/ACT inhaler Inhale two puffs by mouth every four hours as needed (Patient taking differently: Inhale 1-2 puffs into the lungs every 4 (four) hours as needed for wheezing or shortness of breath. )  . cetirizine-pseudoephedrine (ZYRTEC-D) 5-120 MG per tablet Take 1 tablet by mouth daily.    . diazepam (VALIUM) 5 MG tablet Take 1 tablet (5 mg total) by mouth every 8 (eight) hours as needed for anxiety.  . mometasone-formoterol (DULERA) 200-5 MCG/ACT AERO Inhale 2 puffs into the lungs 2 (two) times daily.  . pantoprazole (PROTONIX) 40 MG tablet Take 1 tablet (40 mg total) by mouth daily.  Marland Kitchen. azithromycin (ZITHROMAX) 250 MG tablet Take 2 tablets x 1 day and then one tablet per day for four more days.  . predniSONE (DELTASONE) 10 MG tablet Take 4 tablets x 1 day and then decrease by 1/2 tablet per day until down to zero mg.  No facility-administered encounter medications on file as of 09/21/2016.     Review of Systems  Constitutional: Negative for appetite change and fever.  HENT: Positive for congestion, postnasal drip and voice change. Negative for sinus pressure.   Respiratory: Positive for cough and chest tightness. Negative for shortness of breath.   Cardiovascular: Negative for leg swelling.       Reports some increased heart rate as outlined.    Gastrointestinal: Negative for diarrhea, nausea and vomiting.  Musculoskeletal: Negative for joint swelling and myalgias.  Skin: Negative for color change and rash.  Neurological: Negative for dizziness, light-headedness and headaches.  Psychiatric/Behavioral: Negative for agitation and dysphoric mood.       Objective:    Physical Exam  Constitutional: She appears well-developed and well-nourished. No distress.  HENT:  Mouth/Throat: Oropharynx is clear and  moist.  Nares - slightly erythematous turbinates.   Eyes: Right eye exhibits no discharge. Left eye exhibits no discharge.  Neck: Neck supple.  Cardiovascular: Normal rate and regular rhythm.   Pulmonary/Chest: Breath sounds normal. No respiratory distress. She has no wheezes.  Increased cough with forced expiration.   Lymphadenopathy:    She has no cervical adenopathy.  Skin: No rash noted. No erythema.    BP 120/86   Pulse 67   Temp 98.3 F (36.8 C) (Oral)   Wt 168 lb (76.2 kg)   LMP 09/07/2016 (Approximate)   SpO2 98%   BMI 30.73 kg/m  Wt Readings from Last 3 Encounters:  09/21/16 168 lb (76.2 kg)  08/06/16 172 lb (78 kg)  07/15/16 169 lb (76.7 kg)     Lab Results  Component Value Date   WBC 7.6 07/19/2016   HGB 13.1 07/19/2016   HCT 38.0 07/19/2016   PLT 287.0 07/19/2016   GLUCOSE 93 07/19/2016   CHOL 198 07/19/2016   TRIG 41.0 07/19/2016   HDL 71.40 07/19/2016   LDLDIRECT 154.2 12/26/2008   LDLCALC 118 (H) 07/19/2016   ALT 22 07/19/2016   AST 17 07/19/2016   NA 141 07/19/2016   K 4.1 07/19/2016   CL 105 07/19/2016   CREATININE 0.76 07/19/2016   BUN 11 07/19/2016   CO2 28 07/19/2016   TSH 2.76 07/19/2016       Assessment & Plan:   Problem List Items Addressed This Visit    Asthma    Increased congestion, hoarseness and cough as outlined.  Feel c/w asthma flare.  Discussed with her today.  Increased dulera back to 2 puffs bid.  Prednisone taper as directed.  salne nasal spray and nasacort nasal spray as directed.  mucinex as directed.  Gave her a prescription for zpak to use if symptoms progressed.  Pt comfortable with plan.  She was having some chest tightness associated with the above.  States makes her anxious.  Took a valium last night - helped.  EKG today SR with no acute ischemic changes.  Pt informed of EKG results.  Treat as above.  Follow.  She knows if any change or worsening symptoms, to be reevaluated.        Relevant Medications   predniSONE  (DELTASONE) 10 MG tablet   CHEST PAIN - Primary   Relevant Orders   EKG 12-Lead (Completed)       [place], MD

## 2016-09-21 NOTE — Patient Instructions (Signed)
Increase your inhaler back to two puffs.    Take the antibiotic if symptoms do not improve.    nasacort nasal spray - 2 sprays each nostril one time per day.  Do this in the evening.

## 2016-09-22 ENCOUNTER — Encounter: Payer: Self-pay | Admitting: Internal Medicine

## 2016-09-22 NOTE — Assessment & Plan Note (Addendum)
Increased congestion, hoarseness and cough as outlined.  Feel c/w asthma flare.  Discussed with her today.  Increased dulera back to 2 puffs bid.  Prednisone taper as directed.  salne nasal spray and nasacort nasal spray as directed.  mucinex as directed.  Gave her a prescription for zpak to use if symptoms progressed.  Pt comfortable with plan.  She was having some chest tightness associated with the above.  States makes her anxious.  Took a valium last night - helped.  EKG today SR with no acute ischemic changes.  Pt informed of EKG results.  Treat as above.  Follow.  She knows if any change or worsening symptoms, to be reevaluated.

## 2016-09-30 ENCOUNTER — Other Ambulatory Visit: Payer: Self-pay | Admitting: Family Medicine

## 2016-10-17 ENCOUNTER — Other Ambulatory Visit: Payer: Self-pay | Admitting: Internal Medicine

## 2016-11-25 ENCOUNTER — Ambulatory Visit: Payer: Managed Care, Other (non HMO) | Admitting: Family Medicine

## 2016-12-18 ENCOUNTER — Encounter: Payer: Self-pay | Admitting: Family Medicine

## 2016-12-18 ENCOUNTER — Ambulatory Visit (INDEPENDENT_AMBULATORY_CARE_PROVIDER_SITE_OTHER): Payer: Managed Care, Other (non HMO) | Admitting: Family Medicine

## 2016-12-18 VITALS — BP 120/78 | HR 94 | Temp 97.9°F | Ht 62.0 in | Wt 167.0 lb

## 2016-12-18 DIAGNOSIS — J018 Other acute sinusitis: Secondary | ICD-10-CM | POA: Diagnosis not present

## 2016-12-18 MED ORDER — CEFUROXIME AXETIL 500 MG PO TABS: 500.0000 mg | ORAL_TABLET | Freq: Two times a day (BID) | ORAL | 0 refills | Status: DC

## 2016-12-18 NOTE — Patient Instructions (Signed)
WE NOW OFFER   [facility] FAST TRACK!!!  SAME DAY Appointments for ACUTE CARE  Such as: Sprains, Injuries, cuts, abrasions, rashes, muscle pain, joint pain, back pain Colds, flu, sore throats, headache, allergies, cough, fever  Ear pain, sinus and eye infections Abdominal pain, nausea, vomiting, diarrhea, upset stomach Animal/insect bites  3 Easy Ways to Schedule: Walk-In Scheduling Call in scheduling Mychart Sign-up: [email or web address]         

## 2016-12-18 NOTE — Progress Notes (Signed)
   Subjective:    Patient ID: [name] [name], female    DOB: 1966-04-10, 51 y.o.   MRN: [id number]  HPI Here for 3 days of mild pain in the left ear and the left throat area. No fever or cough. She has some PND. Ibuprofen helps.    Review of Systems  Constitutional: Negative.   HENT: Positive for congestion, ear pain, postnasal drip and sore throat. Negative for sinus pain and sinus pressure.   Eyes: Negative.   Respiratory: Negative.        Objective:   Physical Exam  Constitutional: She appears well-developed and well-nourished.  HENT:  Right Ear: External ear normal.  Left Ear: External ear normal.  Nose: Nose normal.  Mouth/Throat: Oropharynx is clear and moist.  Eyes: Conjunctivae are normal.  Neck: No thyromegaly present.  Pulmonary/Chest: Effort normal and breath sounds normal. No respiratory distress. She has no wheezes. She has no rales.  Lymphadenopathy:    She has no cervical adenopathy.          Assessment & Plan:  Sinusitis, treat with Ceftin.  Gershon CraneStephen Chiniqua Kilcrease, MD

## 2016-12-20 ENCOUNTER — Ambulatory Visit: Payer: Managed Care, Other (non HMO) | Admitting: Family Medicine

## 2016-12-31 ENCOUNTER — Other Ambulatory Visit: Payer: Self-pay | Admitting: Family Medicine

## 2017-01-03 ENCOUNTER — Other Ambulatory Visit: Payer: Self-pay | Admitting: Family Medicine

## 2017-01-24 ENCOUNTER — Ambulatory Visit (INDEPENDENT_AMBULATORY_CARE_PROVIDER_SITE_OTHER): Payer: Managed Care, Other (non HMO) | Admitting: Family Medicine

## 2017-01-24 ENCOUNTER — Encounter: Payer: Self-pay | Admitting: Family Medicine

## 2017-01-24 VITALS — BP 117/87 | HR 96 | Temp 98.3°F | Ht 62.0 in | Wt 168.0 lb

## 2017-01-24 DIAGNOSIS — F411 Generalized anxiety disorder: Secondary | ICD-10-CM | POA: Diagnosis not present

## 2017-01-24 MED ORDER — CITALOPRAM HYDROBROMIDE 10 MG PO TABS: 10.0000 mg | ORAL_TABLET | Freq: Every day | ORAL | 3 refills | Status: DC

## 2017-01-24 MED ORDER — ALBUTEROL SULFATE HFA 108 (90 BASE) MCG/ACT IN AERS: INHALATION_SPRAY | RESPIRATORY_TRACT | 11 refills | Status: DC

## 2017-01-24 NOTE — Progress Notes (Signed)
   Subjective:    Patient ID: [name] [name], female    DOB: 1966-05-25, 51 y.o.   MRN: [id number]  HPI Here to discuss some problems with anxiety. She feels anxious all the time and worries about things. She has trouble sleeping. She has tried Lexapro, Wellbutrin, and Zoloft in the past but had side effects from all of them.    Review of Systems  Constitutional: Negative.   Respiratory: Negative.   Cardiovascular: Negative.   Neurological: Negative.   Psychiatric/Behavioral: Positive for decreased concentration and sleep disturbance. Negative for agitation, confusion and dysphoric mood. The patient is nervous/anxious.        Objective:   Physical Exam  Constitutional: She is oriented to person, place, and time. She appears well-developed and well-nourished.  Cardiovascular: Normal rate, regular rhythm, normal heart sounds and intact distal pulses.   Pulmonary/Chest: Effort normal and breath sounds normal.  Neurological: She is alert and oriented to person, place, and time.  Psychiatric: Her behavior is normal. Thought content normal.  Anxious           Assessment & Plan:  Anxiety, try Celexa 10 mg daily. Recheck in 3 weeks.  Gershon CraneStephen Hayly Litsey, MD

## 2017-01-24 NOTE — Patient Instructions (Signed)
WE NOW OFFER   [facility] FAST TRACK!!!  SAME DAY Appointments for ACUTE CARE  Such as: Sprains, Injuries, cuts, abrasions, rashes, muscle pain, joint pain, back pain Colds, flu, sore throats, headache, allergies, cough, fever  Ear pain, sinus and eye infections Abdominal pain, nausea, vomiting, diarrhea, upset stomach Animal/insect bites  3 Easy Ways to Schedule: Walk-In Scheduling Call in scheduling Mychart Sign-up: [email or web address]         

## 2017-01-29 ENCOUNTER — Other Ambulatory Visit: Payer: Self-pay | Admitting: Family Medicine

## 2017-01-30 NOTE — Telephone Encounter (Signed)
Call in #60 with 2 rf 

## 2017-01-30 NOTE — Telephone Encounter (Signed)
Pt states he has not started the  citalopram (CELEXA) 10 MG tablet  They are going to be traveling next week and she did not want to start while on vacation.  Pt leaving tomorrow  Would like a refill of  diazepam (VALIUM) 5 MG tablet  [id number] [place], [place] - [address]

## 2017-03-27 ENCOUNTER — Encounter: Payer: Self-pay | Admitting: Family Medicine

## 2017-04-13 ENCOUNTER — Other Ambulatory Visit: Payer: Self-pay | Admitting: Family Medicine

## 2017-05-05 ENCOUNTER — Ambulatory Visit (INDEPENDENT_AMBULATORY_CARE_PROVIDER_SITE_OTHER): Payer: Managed Care, Other (non HMO) | Admitting: Family Medicine

## 2017-05-05 ENCOUNTER — Encounter: Payer: Self-pay | Admitting: Family Medicine

## 2017-05-05 VITALS — BP 102/80 | HR 80 | Temp 98.7°F | Ht 62.0 in | Wt 165.0 lb

## 2017-05-05 DIAGNOSIS — J209 Acute bronchitis, unspecified: Secondary | ICD-10-CM | POA: Diagnosis not present

## 2017-05-05 MED ORDER — AZITHROMYCIN 250 MG PO TABS: ORAL_TABLET | ORAL | 0 refills | Status: DC

## 2017-05-05 NOTE — Progress Notes (Signed)
   Subjective:    Patient ID: [name] [name], female    DOB: [birth year], 51 y.o.   MRN: [id number]  HPI Here for 5 days of PND, hoarseness, and a dry cough. No fever.    Review of Systems  Constitutional: Negative.   HENT: Positive for postnasal drip and sore throat. Negative for sinus pain and sinus pressure.   Eyes: Negative.   Respiratory: Positive for cough and wheezing.        Objective:   Physical Exam  Constitutional: She appears well-developed and well-nourished.  HENT:  Right Ear: External ear normal.  Left Ear: External ear normal.  Nose: Nose normal.  Mouth/Throat: Oropharynx is clear and moist.  Eyes: Conjunctivae are normal.  Neck: Neck supple. No thyromegaly present.  Pulmonary/Chest: Effort normal and breath sounds normal. No respiratory distress. She has no wheezes. She has no rales.  Lymphadenopathy:    She has no cervical adenopathy.          Assessment & Plan:  Bronchitis, treat with a Zpack. Use the albuterol inhaler prn.  Gershon CraneStephen Fry, MD

## 2017-05-05 NOTE — Patient Instructions (Signed)
WE NOW OFFER   [facility] FAST TRACK!!!  SAME DAY Appointments for ACUTE CARE  Such as: Sprains, Injuries, cuts, abrasions, rashes, muscle pain, joint pain, back pain Colds, flu, sore throats, headache, allergies, cough, fever  Ear pain, sinus and eye infections Abdominal pain, nausea, vomiting, diarrhea, upset stomach Animal/insect bites  3 Easy Ways to Schedule: Walk-In Scheduling Call in scheduling Mychart Sign-up: [email or web address]         

## 2017-05-12 ENCOUNTER — Telehealth: Payer: Self-pay | Admitting: Family Medicine

## 2017-05-12 NOTE — Telephone Encounter (Signed)
[facility] - Client TELEPHONE ADVICE RECORD [facility]  Patient Name: [name] [name]  DOB: 1965/07/27    Initial Comment Caller States she came in to see her Dr. and was given an antibiotic but she has completed the antibiotic and she has not gotten any better after taking the antibiotic.    Nurse Assessment  Nurse: Fransisco HertzKirkland, RN, Elnita Maxwellheryl Date/Time Lamount Cohen(Eastern Time): 05/12/2017 3:29:07 PM  Confirm and document reason for call. If symptomatic, describe symptoms. ---Caller states she went to see her Dr. and was given an antibiotic but she has completed the antibiotic and she has not gotten any better after taking the antibiotic. She had congestion and hoarseness. Her lungs were clear. Her throat hurts and is sore under her ear. There has some tightness in her chest but no wheezing. She has asthma. She is using her rescue inhaler.  Does the patient have any new or worsening symptoms? ---Yes  Will a triage be completed? ---Yes  Related visit to physician within the last 2 weeks? ---Yes  Does the PT have any chronic conditions? (i.e. diabetes, asthma, etc.) ---Yes  List chronic conditions. ---asthma  Is the patient pregnant or possibly pregnant? (Ask all females between the ages of 5012-55) ---No  Is this a behavioral health or substance abuse call? ---No     Guidelines    Guideline Title Affirmed Question Affirmed Notes  Hoarseness Mild hoarseness    Final Disposition User   Home Care [place]irkland, RN, Homeheryl    Comments  NOTE Caryn Beeheryl Kirkland, RN triaged patient Fabienne BrunsGail Johnson RN BSN documented in to epic for her.   Caller Disagree/Comply Comply  Caller Understands Yes  PreDisposition Did not know what to do

## 2017-05-19 ENCOUNTER — Ambulatory Visit (INDEPENDENT_AMBULATORY_CARE_PROVIDER_SITE_OTHER): Payer: Managed Care, Other (non HMO) | Admitting: Family Medicine

## 2017-05-19 ENCOUNTER — Encounter: Payer: Self-pay | Admitting: Family Medicine

## 2017-05-19 VITALS — BP 118/84 | Temp 98.1°F | Ht 62.0 in | Wt 164.0 lb

## 2017-05-19 DIAGNOSIS — R490 Dysphonia: Secondary | ICD-10-CM

## 2017-05-19 NOTE — Patient Instructions (Signed)
WE NOW OFFER   [facility] FAST TRACK!!!  SAME DAY Appointments for ACUTE CARE  Such as: Sprains, Injuries, cuts, abrasions, rashes, muscle pain, joint pain, back pain Colds, flu, sore throats, headache, allergies, cough, fever  Ear pain, sinus and eye infections Abdominal pain, nausea, vomiting, diarrhea, upset stomach Animal/insect bites  3 Easy Ways to Schedule: Walk-In Scheduling Call in scheduling Mychart Sign-up: [email or web address]         

## 2017-05-19 NOTE — Progress Notes (Signed)
   Subjective:    Patient ID: [name] [name], female    DOB: April 06, 1966, 51 y.o.   MRN: [id number]  HPI Here to discuss hoarseness. She developed URI symptoms about 3 weeks ago and we saw her on 05-05-17 for PND, chest congestion and a cough. She was given a Zpack, and all of these symptoms resolved with the exception of the hoarseness. She is drinking fluids and taking her usual Claritin D daily.    Review of Systems  Constitutional: Negative.   HENT: Positive for voice change. Negative for congestion, postnasal drip, sinus pressure, sinus pain and sore throat.   Eyes: Negative.   Respiratory: Negative.        Objective:   Physical Exam  Constitutional: She appears well-developed and well-nourished.  HENT:  Right Ear: External ear normal.  Left Ear: External ear normal.  Nose: Nose normal.  Mouth/Throat: Oropharynx is clear and moist.  Slight hoarseness to the voice   Eyes: Conjunctivae are normal.  Neck: Neck supple. No thyromegaly present.  Pulmonary/Chest: Effort normal and breath sounds normal. No respiratory distress. She has no wheezes. She has no rales.  Lymphadenopathy:    She has no cervical adenopathy.          Assessment & Plan:  She is  Getting over a URI and has some residual hoarseness. This should resolve in the next week or two. She can try using a bedside vaporizer to add some humidity to the air in her home. Recheck prn.  Gershon CraneStephen Alicha Raspberry, MD

## 2017-06-03 ENCOUNTER — Telehealth: Payer: Self-pay | Admitting: Family Medicine

## 2017-06-03 NOTE — Telephone Encounter (Signed)
The referral was done  

## 2017-06-03 NOTE — Addendum Note (Signed)
Addended by: Gershon CraneFRY, STEPHEN A on: 06/03/2017 10:00 PM   Modules accepted: Orders

## 2017-06-03 NOTE — Telephone Encounter (Signed)
Copied from CRM [id number]. Topic: Referral - Request >> Jun 03, 2017 11:06 AM Oneal GroutSebastian, Jennifer S wrote: Reason for CRM: Requesting referral to ENT, dx hoarseness.

## 2017-06-03 NOTE — Telephone Encounter (Signed)
Sent to PCP to place referral last OV 05/19/2017

## 2017-06-04 NOTE — Telephone Encounter (Signed)
Called pt left a VM that referral was placed.

## 2017-07-09 ENCOUNTER — Other Ambulatory Visit: Payer: Self-pay

## 2017-07-09 MED ORDER — MOMETASONE FURO-FORMOTEROL FUM 200-5 MCG/ACT IN AERO: 2.0000 | INHALATION_SPRAY | Freq: Two times a day (BID) | RESPIRATORY_TRACT | 0 refills | Status: DC

## 2017-07-15 ENCOUNTER — Telehealth: Payer: Self-pay | Admitting: Family Medicine

## 2017-07-15 NOTE — Telephone Encounter (Signed)
Copied from CRM [id number]. Topic: Quick Communication - See Telephone Encounter >> Jul 15, 2017 12:56 PM Clack, Princella PellegriniJessica D wrote: CRM for notification. See Telephone encounter for:  Pt states her insurance company will no longer cover her mometasone-formoterol (DULERA) 200-5 MCG/ACT AERO [[id number]][[id number]]. She states they will cover: flovent, breo, advair and  pulmicort. She would like to know what the provider would recommend and if she can get a sample before hand. 07/15/17.

## 2017-07-15 NOTE — Telephone Encounter (Signed)
Sent to PCP ?

## 2017-07-15 NOTE — Telephone Encounter (Signed)
Stop Dulera and call in Advair 250/50 to take one puff bid, 90 days with 3 rf. No samples available

## 2017-07-16 MED ORDER — FLUTICASONE-SALMETEROL 250-50 MCG/DOSE IN AEPB: 1.0000 | INHALATION_SPRAY | Freq: Two times a day (BID) | RESPIRATORY_TRACT | 3 refills | Status: DC

## 2017-07-16 NOTE — Telephone Encounter (Signed)
Called pt and left a detailed VM. Advised pt to stop the dulera and that [facility] [address] [facility]. Advised pt that we have no samples.

## 2017-08-02 ENCOUNTER — Other Ambulatory Visit: Payer: Self-pay | Admitting: Family Medicine

## 2017-08-08 ENCOUNTER — Encounter: Payer: Self-pay | Admitting: Family Medicine

## 2017-08-08 ENCOUNTER — Ambulatory Visit (INDEPENDENT_AMBULATORY_CARE_PROVIDER_SITE_OTHER): Payer: Managed Care, Other (non HMO) | Admitting: Family Medicine

## 2017-08-08 VITALS — BP 106/64 | HR 84 | Temp 98.4°F | Wt 157.8 lb

## 2017-08-08 DIAGNOSIS — S86892A Other injury of other muscle(s) and tendon(s) at lower leg level, left leg, initial encounter: Secondary | ICD-10-CM | POA: Diagnosis not present

## 2017-08-08 NOTE — Progress Notes (Signed)
   Subjective:    Patient ID: [name] [name] F Pelphrey, female    DOB: Feb 08, 1966, 52 y.o.   MRN: [id number]  HPI Here for 5 days of sharp pains in the anterior left lower leg. No visible swelling. She has been working out with a Psychologist, educationaltrainer using a step machine, a treadmill, and an eliptical bike.    Review of Systems  Constitutional: Negative.   Respiratory: Negative.   Cardiovascular: Negative.   Musculoskeletal: Positive for myalgias.       Objective:   Physical Exam  Constitutional: She appears well-developed and well-nourished.  Cardiovascular: Normal rate, regular rhythm, normal heart sounds and intact distal pulses.  Pulmonary/Chest: Effort normal and breath sounds normal. No respiratory distress. She has no wheezes. She has no rales.  Musculoskeletal:  The left lower leg appears normal. She is tender along the shin area           Assessment & Plan:  Shin splints from overuse. She needs to rest and avoid the stepper and the treadmill for a week or two. Apply ice to the area tid. Take Ibuprofen prn.  Gershon CraneStephen Danija Gosa, MD

## 2018-02-09 ENCOUNTER — Encounter: Payer: Self-pay | Admitting: Family Medicine

## 2018-02-09 ENCOUNTER — Ambulatory Visit (INDEPENDENT_AMBULATORY_CARE_PROVIDER_SITE_OTHER): Payer: Managed Care, Other (non HMO) | Admitting: Family Medicine

## 2018-02-09 VITALS — BP 98/66 | HR 82 | Temp 98.5°F | Ht 62.0 in | Wt 141.2 lb

## 2018-02-09 DIAGNOSIS — E785 Hyperlipidemia, unspecified: Secondary | ICD-10-CM | POA: Diagnosis not present

## 2018-02-09 DIAGNOSIS — K219 Gastro-esophageal reflux disease without esophagitis: Secondary | ICD-10-CM | POA: Diagnosis not present

## 2018-02-09 MED ORDER — PANTOPRAZOLE SODIUM 40 MG PO TBEC: 40.0000 mg | DELAYED_RELEASE_TABLET | Freq: Two times a day (BID) | ORAL | 3 refills | Status: DC

## 2018-02-09 NOTE — Progress Notes (Signed)
   Subjective:    Patient ID: [name] [name], female    DOB: [birth year], 52 y.o.   MRN: [id number]  HPI Here for intermittent heartburn and occasional trouble swallowing. No SOB. She takes Protonix only sporadically.    Review of Systems  Constitutional: Negative.   HENT: Positive for trouble swallowing.   Respiratory: Negative.   Cardiovascular: Positive for chest pain. Negative for palpitations and leg swelling.  Gastrointestinal: Negative.        Objective:   Physical Exam  Constitutional: She appears well-developed and well-nourished.  Cardiovascular: Normal rate, regular rhythm, normal heart sounds and intact distal pulses.  Pulmonary/Chest: Effort normal and breath sounds normal.  Abdominal: Soft. Bowel sounds are normal. She exhibits no distension and no mass. There is no tenderness. There is no rebound and no guarding.          Assessment & Plan:  This is GERD. She will increase the Protonix to bid. Recheck prn. Gershon CraneStephen Fry, MD

## 2018-02-11 ENCOUNTER — Other Ambulatory Visit: Payer: Self-pay | Admitting: Family Medicine

## 2018-02-12 ENCOUNTER — Telehealth: Payer: Self-pay | Admitting: Family Medicine

## 2018-02-12 ENCOUNTER — Other Ambulatory Visit (INDEPENDENT_AMBULATORY_CARE_PROVIDER_SITE_OTHER): Payer: Managed Care, Other (non HMO)

## 2018-02-12 DIAGNOSIS — E785 Hyperlipidemia, unspecified: Secondary | ICD-10-CM

## 2018-02-12 LAB — POC URINALSYSI DIPSTICK (AUTOMATED)
Bilirubin, UA: NEGATIVE
Blood, UA: NEGATIVE
Glucose, UA: NEGATIVE
Ketones, UA: NEGATIVE
Leukocytes, UA: NEGATIVE
Nitrite, UA: NEGATIVE
Protein, UA: NEGATIVE
Spec Grav, UA: 1.01 (ref 1.010–1.025)
Urobilinogen, UA: 0.2 E.U./dL
pH, UA: 7 (ref 5.0–8.0)

## 2018-02-12 LAB — CBC WITH DIFFERENTIAL/PLATELET
Basophils Absolute: 0.1 10*3/uL (ref 0.0–0.1)
Basophils Relative: 1.2 % (ref 0.0–3.0)
Eosinophils Absolute: 0.1 10*3/uL (ref 0.0–0.7)
Eosinophils Relative: 2.6 % (ref 0.0–5.0)
HCT: 41.2 % (ref 36.0–46.0)
Hemoglobin: 14 g/dL (ref 12.0–15.0)
Lymphocytes Relative: 34.6 % (ref 12.0–46.0)
Lymphs Abs: 1.9 10*3/uL (ref 0.7–4.0)
MCHC: 33.9 g/dL (ref 30.0–36.0)
MCV: 91.2 fl (ref 78.0–100.0)
Monocytes Absolute: 0.6 10*3/uL (ref 0.1–1.0)
Monocytes Relative: 11.1 % (ref 3.0–12.0)
Neutro Abs: 2.8 10*3/uL (ref 1.4–7.7)
Neutrophils Relative %: 50.5 % (ref 43.0–77.0)
Platelets: 278 10*3/uL (ref 150.0–400.0)
RBC: 4.52 Mil/uL (ref 3.87–5.11)
RDW: 12.1 % (ref 11.5–15.5)
WBC: 5.5 10*3/uL (ref 4.0–10.5)

## 2018-02-12 LAB — TSH: TSH: 4.49 u[IU]/mL (ref 0.35–4.50)

## 2018-02-12 LAB — HEPATIC FUNCTION PANEL
ALT: 12 U/L (ref 0–35)
AST: 12 U/L (ref 0–37)
Albumin: 4.4 g/dL (ref 3.5–5.2)
Alkaline Phosphatase: 69 U/L (ref 39–117)
Bilirubin, Direct: 0.2 mg/dL (ref 0.0–0.3)
Total Bilirubin: 1 mg/dL (ref 0.2–1.2)
Total Protein: 7.1 g/dL (ref 6.0–8.3)

## 2018-02-12 LAB — LIPID PANEL
Cholesterol: 195 mg/dL (ref 0–200)
HDL: 70.7 mg/dL (ref >39.00)
LDL Cholesterol: 113 mg/dL — ABNORMAL HIGH (ref 0–99)
NonHDL: 124.12
Total CHOL/HDL Ratio: 3
Triglycerides: 56 mg/dL (ref 0.0–149.0)
VLDL: 11.2 mg/dL (ref 0.0–40.0)

## 2018-02-12 LAB — BASIC METABOLIC PANEL
BUN: 11 mg/dL (ref 6–23)
CO2: 31 mEq/L (ref 19–32)
Calcium: 10 mg/dL (ref 8.4–10.5)
Chloride: 102 mEq/L (ref 96–112)
Creatinine, Ser: 0.84 mg/dL (ref 0.40–1.20)
GFR: 76.23 mL/min (ref >60.00)
Glucose, Bld: 92 mg/dL (ref 70–99)
Potassium: 4.3 mEq/L (ref 3.5–5.1)
Sodium: 140 mEq/L (ref 135–145)

## 2018-02-12 NOTE — Telephone Encounter (Signed)
Done

## 2018-02-12 NOTE — Addendum Note (Signed)
Addended by: Gershon CraneFRY, Troy Kanouse A on: 02/12/2018 08:14 AM   Modules accepted: Orders

## 2018-04-12 ENCOUNTER — Other Ambulatory Visit: Payer: Self-pay | Admitting: Family Medicine

## 2018-06-14 ENCOUNTER — Other Ambulatory Visit: Payer: Self-pay | Admitting: Family Medicine

## 2018-06-16 ENCOUNTER — Ambulatory Visit (INDEPENDENT_AMBULATORY_CARE_PROVIDER_SITE_OTHER): Payer: Managed Care, Other (non HMO) | Admitting: Family Medicine

## 2018-06-16 ENCOUNTER — Encounter: Payer: Self-pay | Admitting: Family Medicine

## 2018-06-16 ENCOUNTER — Ambulatory Visit (INDEPENDENT_AMBULATORY_CARE_PROVIDER_SITE_OTHER): Payer: Managed Care, Other (non HMO)

## 2018-06-16 VITALS — BP 118/60 | HR 80 | Temp 98.1°F | Wt 150.4 lb

## 2018-06-16 DIAGNOSIS — G8929 Other chronic pain: Secondary | ICD-10-CM

## 2018-06-16 DIAGNOSIS — M546 Pain in thoracic spine: Secondary | ICD-10-CM

## 2018-06-16 DIAGNOSIS — M542 Cervicalgia: Secondary | ICD-10-CM

## 2018-06-16 MED ORDER — DIAZEPAM 5 MG PO TABS: 5.0000 mg | ORAL_TABLET | Freq: Three times a day (TID) | ORAL | 5 refills | Status: DC | PRN

## 2018-06-16 NOTE — Progress Notes (Signed)
   Subjective:    Patient ID: [name] [name], female    DOB: August 15, 1965, 52 y.o.   MRN: [id number]  HPI Here to discuss some intermittent symptoms that started about 8 weeks ago. No hx of falls or trauma. She has episodes of numbness or tingling or pain in the left middle back around the shoulder blade or in the left neck area. No radiation down the left arm. Using Ibuprofen.    Review of Systems  Constitutional: Negative.   Respiratory: Negative.   Cardiovascular: Negative.   Musculoskeletal: Positive for back pain, neck pain and neck stiffness.       Objective:   Physical Exam  Constitutional: She is oriented to person, place, and time. She appears well-developed and well-nourished. No distress.  Cardiovascular: Normal rate, regular rhythm, normal heart sounds and intact distal pulses.  Pulmonary/Chest: Effort normal and breath sounds normal.  Musculoskeletal:  The back and neck are not tender at all, no spasm, and ROM is full   Neurological: She is alert and oriented to person, place, and time.          Assessment & Plan:  Neck pain and left thoracic pain. She can use moist heat and stretches. Get Xrays today of the cervical and thoracic spines.  Gershon CraneStephen Tylasia Fletchall, MD

## 2018-06-19 ENCOUNTER — Encounter: Payer: Self-pay | Admitting: *Deleted

## 2018-06-26 ENCOUNTER — Ambulatory Visit: Payer: Managed Care, Other (non HMO) | Admitting: Family Medicine

## 2018-07-15 ENCOUNTER — Ambulatory Visit: Payer: Self-pay

## 2018-07-15 NOTE — Telephone Encounter (Signed)
[name] [name] [address] calling stating that the Fluticasone-Salmeterol (ADVAIR DISKUS) 250-50 MCG/DOSE AEPB Will cost $200 and want to know if it can be changed to the Breo 100-25 or 200-25 MCG/Dose with a manufactor coupon it'll be $100

## 2018-07-15 NOTE — Telephone Encounter (Signed)
Dr. Fry please advise. Thanks  

## 2018-07-16 NOTE — Telephone Encounter (Signed)
Stop Advair and change to Breo 100-25, to use one puff bid, #1 with 11 rf

## 2018-07-17 ENCOUNTER — Telehealth: Payer: Self-pay | Admitting: Family Medicine

## 2018-07-17 MED ORDER — FLUTICASONE FUROATE-VILANTEROL 100-25 MCG/INH IN AEPB: 1.0000 | INHALATION_SPRAY | Freq: Two times a day (BID) | RESPIRATORY_TRACT | 11 refills | Status: DC

## 2018-07-17 NOTE — Telephone Encounter (Signed)
Have her make another OV to re-evaluate

## 2018-07-17 NOTE — Telephone Encounter (Signed)
Dr. Fry please advise. Thanks  

## 2018-07-17 NOTE — Addendum Note (Signed)
Addended by: Marcellus Scott on: 07/17/2018 11:14 AM   Modules accepted: Orders

## 2018-07-17 NOTE — Telephone Encounter (Signed)
Copied from CRM [id number]. Topic: General - Inquiry >> Jul 17, 2018  1:30 PM Maia Petties wrote: Reason for CRM: pt states that she is still having numbness/tingling in her back that she had at OV 06/16/18. Pt states that she has not gotten a massage. The numbness/tingling seems to come thru to the other side of her body into her chest on occasion. Pt states no SOB, no trouble breathing, no pain, no heart palpations. She states the feeling comes and goes but only lasts a few minutes. It's been going on for about 2 weeks. It does not keep her awake. Pt asking for advice.

## 2018-07-17 NOTE — Telephone Encounter (Signed)
Called and spoke with pt and she is scheduled to see Dr. Clent Ridges on Monday at 415

## 2018-07-17 NOTE — Telephone Encounter (Signed)
Medication has been updated in the chart.  breo has been sent to the pharmacy and adviar will be stopped.

## 2018-07-20 ENCOUNTER — Encounter: Payer: Self-pay | Admitting: Family Medicine

## 2018-07-20 ENCOUNTER — Ambulatory Visit (INDEPENDENT_AMBULATORY_CARE_PROVIDER_SITE_OTHER): Payer: Managed Care, Other (non HMO) | Admitting: Family Medicine

## 2018-07-20 VITALS — BP 120/84 | HR 68 | Temp 98.1°F | Wt 155.4 lb

## 2018-07-20 DIAGNOSIS — M940 Chondrocostal junction syndrome [Tietze]: Secondary | ICD-10-CM

## 2018-07-20 MED ORDER — FLUTICASONE FUROATE-VILANTEROL 100-25 MCG/INH IN AEPB: 1.0000 | INHALATION_SPRAY | Freq: Every day | RESPIRATORY_TRACT | 11 refills | Status: DC

## 2018-07-20 MED ORDER — METHYLPREDNISOLONE 4 MG PO TBPK: ORAL_TABLET | ORAL | 0 refills | Status: DC

## 2018-07-20 NOTE — Progress Notes (Signed)
   Subjective:    Patient ID: [name] [name], female    DOB: 05-Jan-1966, 53 y.o.   MRN: [id number]  HPI Here to follow up on symptoms involving her trunk that started about 2 weeks ago. They started on her middle back and she describes this as a numbness or a tingling. Now for  several days she also has had mild sharp pains in the chest that come and go. No cough or SOB.    Review of Systems  Constitutional: Negative.   Respiratory: Negative.   Cardiovascular: Positive for chest pain. Negative for palpitations and leg swelling.  Neurological: Positive for numbness. Negative for weakness.       Objective:   Physical Exam Constitutional:      General: She is not in acute distress.    Appearance: Normal appearance.  Neck:     Musculoskeletal: No neck rigidity.  Cardiovascular:     Rate and Rhythm: Normal rate and regular rhythm.     Pulses: Normal pulses.     Heart sounds: Normal heart sounds.  Pulmonary:     Effort: Pulmonary effort is normal. No respiratory distress.     Breath sounds: Normal breath sounds. No stridor. No wheezing, rhonchi or rales.     Comments: She is tender along both sternal margins  Musculoskeletal:     Comments: No back tenderness   Lymphadenopathy:     Cervical: No cervical adenopathy.  Neurological:     General: No focal deficit present.     Mental Status: She is alert and oriented to person, place, and time.           Assessment & Plan:  Costochondritis, treat with a Medrol dose pack. Recheck prn. Gershon Crane, MD

## 2018-08-14 ENCOUNTER — Encounter: Payer: Self-pay | Admitting: Internal Medicine

## 2018-08-14 ENCOUNTER — Ambulatory Visit (INDEPENDENT_AMBULATORY_CARE_PROVIDER_SITE_OTHER): Payer: Managed Care, Other (non HMO) | Admitting: Internal Medicine

## 2018-08-14 VITALS — BP 102/64 | HR 104 | Temp 98.3°F | Wt 152.2 lb

## 2018-08-14 DIAGNOSIS — J069 Acute upper respiratory infection, unspecified: Secondary | ICD-10-CM

## 2018-08-14 MED ORDER — HYDROCODONE-HOMATROPINE 5-1.5 MG/5ML PO SYRP: 5.0000 mL | ORAL_SOLUTION | Freq: Three times a day (TID) | ORAL | 0 refills | Status: DC | PRN

## 2018-08-14 NOTE — Progress Notes (Signed)
Established Patient Office Visit     CC/Reason for Visit: head congestion  HPI: [name] [name] is a 53 y.o. female who is coming in today for the above mentioned reason.  Patient c/o of cough and congestion that started two days ago.  Patient sts she has asthma and sts the cough is productive, yellow phelm that is thick.  Denies headache, ear pain, fever, wheezing and sore throat.  Denies N/V/D.  She sts her daughter has had a cold for about a week.     Past Medical/Surgical History: Past Medical History:  Diagnosis Date  . Allergic rhinitis   . Anxiety disorder   . Asthma   . Depression    [facility] Nolen MuMcKinney  . GERD (gastroesophageal reflux disease)   . UTI (urinary tract infection)     Past Surgical History:  Procedure Laterality Date  . CESAREAN SECTION  2001  . CESAREAN SECTION  2003  . CHOLECYSTECTOMY    . NISSEN FUNDOPLICATION  07/2005   Dr. Daphine DeutscherMartin  . TONSILLECTOMY    . TUBAL LIGATION    . VENTRAL HERNIA REPAIR  1/09   Dr. Daphine DeutscherMartin    Social History:  reports that she has never smoked. She has never used smokeless tobacco. She reports that she does not drink alcohol or use drugs.  Allergies: Allergies  Allergen Reactions  . Penicillins     REACTION: childhood - hives/rash Has patient had a PCN reaction causing immediate rash, facial/tongue/throat swelling, SOB or lightheadedness with hypotension: Yes Has patient had a PCN reaction causing severe rash involving mucus membranes or skin necrosis: Yes Has patient had a PCN reaction that required hospitalization No Has patient had a PCN reaction occurring within the last 10 years: No If all of the above answers are "NO", then may proceed with Cephalosporin use.   . Sulfonamide Derivatives     REACTION: childhood - hives/rash    Family History:  Family History  Problem Relation Age of Onset  . Alcohol abuse Unknown   . Arthritis Unknown   . Breast cancer Unknown   . Coronary artery  disease Unknown   . Hypertension Unknown   . Rheum arthritis Mother   . Crohn's disease Sister   . Heart attack Paternal Grandfather      Current Outpatient Medications:  .  albuterol (PROVENTIL HFA;VENTOLIN HFA) 108 (90 Base) MCG/ACT inhaler, INHALE TWO PUFFS BY MOUTH EVERY FOUR HOURS AS NEEDED, Disp: 25.5 Inhaler, Rfl: 3 .  cetirizine-pseudoephedrine (ZYRTEC-D) 5-120 MG per tablet, Take 1 tablet by mouth daily.  , Disp: , Rfl:  .  diazepam (VALIUM) 5 MG tablet, Take 1 tablet (5 mg total) by mouth every 8 (eight) hours as needed., Disp: 60 tablet, Rfl: 5 .  fluticasone furoate-vilanterol (BREO ELLIPTA) 100-25 MCG/INH AEPB, Inhale 1 puff into the lungs daily., Disp: 1 each, Rfl: 11 .  pantoprazole (PROTONIX) 40 MG tablet, Take 1 tablet (40 mg total) by mouth 2 (two) times daily., Disp: 180 tablet, Rfl: 3  Review of Systems:  Constitutional: Denies fever, chills, diaphoresis, appetite change and fatigue.  HEENT: Denies photophobia, eye pain, redness, hearing loss, ear pain sore throat, rhinorrhea, sneezing, mouth sores, trouble swallowing, neck pain, neck stiffness and tinnitus.  C/o congestion Respiratory: Denies SOB, and wheezing.  C/o DOE, cough and slight chest tightness at times Cardiovascular: Denies chest pain, palpitations and leg swelling.  Gastrointestinal: Denies nausea, vomiting, abdominal pain, diarrhea, constipation, blood in stool and abdominal distention.  Genitourinary: Denies dysuria, urgency, frequency, hematuria, flank pain and difficulty urinating.  Endocrine: Denies: hot or cold intolerance, sweats, changes in hair or nails, polyuria, polydipsia. Musculoskeletal: Denies myalgias, back pain, joint swelling, arthralgias and gait problem.  Skin: Denies pallor, rash and wound.  Neurological: Denies dizziness, seizures, syncope, weakness, light-headedness, numbness and headaches.  Hematological: Denies adenopathy. Easy bruising, personal or family bleeding history    Psychiatric/Behavioral: Denies suicidal ideation, mood changes, confusion, nervousness, sleep disturbance and agitation   Physical Exam: Vitals:   08/14/18 1458  BP: 102/64  Pulse: (!) 104  Temp: 98.3 F (36.8 C)  TempSrc: Oral  SpO2: 94%  Weight: 152 lb 3.2 oz (69 kg)    Body mass index is 27.84 kg/m.   Constitutional: NAD, calm Eyes: PERRL, lids and conjunctivae normal ENMT: Mucous membranes are moist. Posterior pharynx clear of any exudate or lesions. Normal dentition. Tympanic membrane is pearly white, no erythema or bulging. Respiratory: clear to auscultation bilaterally, no wheezing, no crackles. Normal respiratory effort. No accessory muscle use.  Cardiovascular: Regular rate and rhythm, no murmurs / rubs / gallops. No extremity edema. 2+ pedal pulses. No carotid bruits.   Psychiatric: Normal judgment and insight. Alert and oriented x 3. Normal mood.    Impression and Plan:  URI, acute -increase water intake & get plenty of rest -no signs of PNA on chest auscultation, no evidence of ear/throat infection so no abx provided. -Hycodan cough syrup at bedtime as coughing is inducing bronchospasm and she has a h/o asthma. -RTC in 10-14 days if no improvement.    Patient Instructions  Good seeing you today.  Instructions: -increase water intake & get plenty of rest -take delsym OTC as needed for your cough -cont to take zrytec D as needed -pick up cough medicine, hycodan, at your pharmacy if OTC cough medication does not help your symptoms; take as prescribed   Upper Respiratory Infection, Adult An upper respiratory infection (URI) affects the nose, throat, and upper air passages. URIs are caused by germs (viruses). The most common type of URI is often called "the common cold." Medicines cannot cure URIs, but you can do things at home to relieve your symptoms. URIs usually get better within 7-10 days. Follow these instructions at home: Activity  Rest as  needed.  If you have a fever, stay home from work or school until your fever is gone, or until your doctor says you may return to work or school. ? You should stay home until you cannot spread the infection anymore (you are not contagious). ? Your doctor may have you wear a face mask so you have less risk of spreading the infection. Relieving symptoms  Gargle with a salt-water mixture 3-4 times a day or as needed. To make a salt-water mixture, completely dissolve -1 tsp of salt in 1 cup of warm water.  Use a cool-mist humidifier to add moisture to the air. This can help you breathe more easily. Eating and drinking   Drink enough fluid to keep your pee (urine) pale yellow.  Eat soups and other clear broths. General instructions   Take over-the-counter and prescription medicines only as told by your doctor. These include cold medicines, fever reducers, and cough suppressants.  Do not use any products that contain nicotine or tobacco. These include cigarettes and e-cigarettes. If you need help quitting, ask your doctor.  Avoid being where people are smoking (avoid secondhand smoke).  Make sure you get regular shots and get the flu shot every  year.  Keep all follow-up visits as told by your doctor. This is important. How to avoid spreading infection to others   Wash your hands often with soap and water. If you do not have soap and water, use hand sanitizer.  Avoid touching your mouth, face, eyes, or nose.  Cough or sneeze into a tissue or your sleeve or elbow. Do not cough or sneeze into your hand or into the air. Contact a doctor if:  You are getting worse, not better.  You have any of these: ? A fever. ? Chills. ? Brown or red mucus in your nose. ? Yellow or brown fluid (discharge)coming from your nose. ? Pain in your face, especially when you bend forward. ? Swollen neck glands. ? Pain with swallowing. ? White areas in the back of your throat. Get help right away  if:  You have shortness of breath that gets worse.  You have very bad or constant: ? Headache. ? Ear pain. ? Pain in your forehead, behind your eyes, and over your cheekbones (sinus pain). ? Chest pain.  You have long-lasting (chronic) lung disease along with any of these: ? Wheezing. ? Long-lasting cough. ? Coughing up blood. ? A change in your usual mucus.  You have a stiff neck.  You have changes in your: ? Vision. ? Hearing. ? Thinking. ? Mood. Summary  An upper respiratory infection (URI) is caused by a germ called a virus. The most common type of URI is often called "the common cold."  URIs usually get better within 7-10 days.  Take over-the-counter and prescription medicines only as told by your doctor. This information is not intended to replace advice given to you by your health care provider. Make sure you discuss any questions you have with your health care provider. Document Released: 12/11/2007 Document Revised: 02/14/2017 Document Reviewed: 02/14/2017 Elsevier Interactive Patient Education  2019 [employer] Inc.      Murlean IbaAmanda T Papa [place], RN DNP [facility]

## 2018-08-14 NOTE — Patient Instructions (Addendum)
Good seeing you today.  Instructions: -increase water intake & get plenty of rest -take delsym OTC as needed for your cough -cont to take zrytec D as needed -pick up cough medicine, hycodan, at your pharmacy if OTC cough medication does not help your symptoms; take as prescribed   Upper Respiratory Infection, Adult An upper respiratory infection (URI) affects the nose, throat, and upper air passages. URIs are caused by germs (viruses). The most common type of URI is often called "the common cold." Medicines cannot cure URIs, but you can do things at home to relieve your symptoms. URIs usually get better within 7-10 days. Follow these instructions at home: Activity  Rest as needed.  If you have a fever, stay home from work or school until your fever is gone, or until your doctor says you may return to work or school. ? You should stay home until you cannot spread the infection anymore (you are not contagious). ? Your doctor may have you wear a face mask so you have less risk of spreading the infection. Relieving symptoms  Gargle with a salt-water mixture 3-4 times a day or as needed. To make a salt-water mixture, completely dissolve -1 tsp of salt in 1 cup of warm water.  Use a cool-mist humidifier to add moisture to the air. This can help you breathe more easily. Eating and drinking   Drink enough fluid to keep your pee (urine) pale yellow.  Eat soups and other clear broths. General instructions   Take over-the-counter and prescription medicines only as told by your doctor. These include cold medicines, fever reducers, and cough suppressants.  Do not use any products that contain nicotine or tobacco. These include cigarettes and e-cigarettes. If you need help quitting, ask your doctor.  Avoid being where people are smoking (avoid secondhand smoke).  Make sure you get regular shots and get the flu shot every year.  Keep all follow-up visits as told by your doctor. This is  important. How to avoid spreading infection to others   Wash your hands often with soap and water. If you do not have soap and water, use hand sanitizer.  Avoid touching your mouth, face, eyes, or nose.  Cough or sneeze into a tissue or your sleeve or elbow. Do not cough or sneeze into your hand or into the air. Contact a doctor if:  You are getting worse, not better.  You have any of these: ? A fever. ? Chills. ? Brown or red mucus in your nose. ? Yellow or brown fluid (discharge)coming from your nose. ? Pain in your face, especially when you bend forward. ? Swollen neck glands. ? Pain with swallowing. ? White areas in the back of your throat. Get help right away if:  You have shortness of breath that gets worse.  You have very bad or constant: ? Headache. ? Ear pain. ? Pain in your forehead, behind your eyes, and over your cheekbones (sinus pain). ? Chest pain.  You have long-lasting (chronic) lung disease along with any of these: ? Wheezing. ? Long-lasting cough. ? Coughing up blood. ? A change in your usual mucus.  You have a stiff neck.  You have changes in your: ? Vision. ? Hearing. ? Thinking. ? Mood. Summary  An upper respiratory infection (URI) is caused by a germ called a virus. The most common type of URI is often called "the common cold."  URIs usually get better within 7-10 days.  Take over-the-counter and prescription medicines only  as told by your doctor. This information is not intended to replace advice given to you by your health care provider. Make sure you discuss any questions you have with your health care provider. Document Released: 12/11/2007 Document Revised: 02/14/2017 Document Reviewed: 02/14/2017 Elsevier Interactive Patient Education  2019 [employer].

## 2018-08-18 ENCOUNTER — Ambulatory Visit: Payer: Self-pay

## 2018-08-18 NOTE — Telephone Encounter (Signed)
Pt called stating that she has been sick since 2/4.  She was seen in the office last Friday for her symptoms. She was given cough medication.  She states that her cough has not improved.  With cough she expectorates thick yellow phlegm.  She states in the mornings she can sometimes see blood streaks.  She states her chest is tight.  She has a Hx of asthma.  She feels that at times she may have a low grade fever. Appointment scheduled per protocol. Care advice read to patient. Pt verbalized understanding of all instructions.  Reason for Disposition . [1] Continuous (nonstop) coughing interferes with work or school AND [2] no improvement using cough treatment per protocol  Answer Assessment - Initial Assessment Questions 1. ONSET: "When did the cough begin?"      08/11/2018 2. SEVERITY: "How bad is the cough today?"      Severe 3. RESPIRATORY DISTRESS: "Describe your breathing."      tight 4. FEVER: "Do you have a fever?" If so, ask: "What is your temperature, how was it measured, and when did it start?"     Low grade 5. HEMOPTYSIS: "Are you coughing up any blood?" If so ask: "How much?" (flecks, streaks, tablespoons, etc.)     Thick yellow with a little blood 1st thing in the am 6. TREATMENT: "What have you done so far to treat the cough?" (e.g., meds, fluids, humidifier)     RX cough medication  7. CARDIAC HISTORY: "Do you have any history of heart disease?" (e.g., heart attack, congestive heart failure)      no 8. LUNG HISTORY: "Do you have any history of lung disease?"  (e.g., pulmonary embolus, asthma, emphysema)     asthma 9. PE RISK FACTORS: "Do you have a history of blood clots?" (or: recent major surgery, recent prolonged travel, bedridden)     no 10. OTHER SYMPTOMS: "Do you have any other symptoms? (e.g., runny nose, wheezing, chest pain)       Tightness to chest, no wheezing. 11. PREGNANCY: "Is there any chance you are pregnant?" "When was your last menstrual period?"       No 12.  TRAVEL: "Have you traveled out of the country in the last month?" (e.g., travel history, exposures)       No  Protocols used: COUGH - ACUTE NON-PRODUCTIVE-A-AH

## 2018-08-18 NOTE — Telephone Encounter (Signed)
Noted  

## 2018-08-19 ENCOUNTER — Ambulatory Visit (INDEPENDENT_AMBULATORY_CARE_PROVIDER_SITE_OTHER): Payer: Managed Care, Other (non HMO) | Admitting: Family Medicine

## 2018-08-19 ENCOUNTER — Ambulatory Visit (INDEPENDENT_AMBULATORY_CARE_PROVIDER_SITE_OTHER): Payer: Managed Care, Other (non HMO)

## 2018-08-19 ENCOUNTER — Encounter: Payer: Self-pay | Admitting: Family Medicine

## 2018-08-19 VITALS — BP 98/78 | HR 88 | Temp 98.0°F | Ht 62.0 in | Wt 153.6 lb

## 2018-08-19 DIAGNOSIS — J45909 Unspecified asthma, uncomplicated: Secondary | ICD-10-CM

## 2018-08-19 DIAGNOSIS — J209 Acute bronchitis, unspecified: Secondary | ICD-10-CM

## 2018-08-19 DIAGNOSIS — R05 Cough: Secondary | ICD-10-CM

## 2018-08-19 DIAGNOSIS — R059 Cough, unspecified: Secondary | ICD-10-CM

## 2018-08-19 MED ORDER — IPRATROPIUM-ALBUTEROL 0.5-2.5 (3) MG/3ML IN SOLN
3.0000 mL | Freq: Once | RESPIRATORY_TRACT | Status: AC
  Administered 2018-08-19: 3 mL via RESPIRATORY_TRACT

## 2018-08-19 MED ORDER — AZITHROMYCIN 250 MG PO TABS: ORAL_TABLET | ORAL | 0 refills | Status: DC

## 2018-08-19 MED ORDER — METHYLPREDNISOLONE 4 MG PO TBPK: ORAL_TABLET | ORAL | 0 refills | Status: DC

## 2018-08-19 NOTE — Progress Notes (Signed)
   Subjective:    Patient ID: [name] [name], female    DOB: 12-16-1965, 53 y.o.   MRN: [id number]  HPI Here for one week of stuffy head, PND, and a dry cough. She was seen here on 08-14-18 and her lungs were clear. It was felt to be viral and she went home to use Zyrtec D, Mucinex, and her usual inhalers. No fever. However in the past 3 days her chest has gooten tighter, she is wheezing, and the cough is harder.    Review of Systems  Constitutional: Negative.   HENT: Positive for congestion and postnasal drip. Negative for sinus pressure, sinus pain and sore throat.   Eyes: Negative.   Respiratory: Positive for cough, chest tightness, shortness of breath and wheezing.        Objective:   Physical Exam Constitutional:      Appearance: She is ill-appearing.  HENT:     Right Ear: Tympanic membrane and ear canal normal.     Left Ear: Tympanic membrane and ear canal normal.     Nose: Nose normal.     Mouth/Throat:     Pharynx: Oropharynx is clear.  Eyes:     Conjunctiva/sclera: Conjunctivae normal.  Pulmonary:     Effort: Pulmonary effort is normal.     Breath sounds: Wheezing and rhonchi present. No rales.     Comments: Her CXR today is clear  Lymphadenopathy:     Cervical: No cervical adenopathy.  Neurological:     Mental Status: She is alert.           Assessment & Plan:  Bronchitis with an asthma exacerbation. Given a nebulizer treatment with Duoneb.  Treat with a Zpack and a Medrol dose pack. Recheck prn. Gershon Crane, MD

## 2018-08-25 ENCOUNTER — Telehealth: Payer: Self-pay

## 2018-08-25 NOTE — Telephone Encounter (Signed)
Dr. Fry please advise. Thanks  

## 2018-08-25 NOTE — Telephone Encounter (Signed)
Have her make another OV

## 2018-08-25 NOTE — Telephone Encounter (Signed)
Called and spoke with pt and she is aware of appt with Dr. Clent Ridges on 2/19 at 415.

## 2018-08-25 NOTE — Telephone Encounter (Signed)
Copied from CRM [id number]. Topic: General - Other >> Aug 24, 2018  5:11 PM Jilda Roche wrote: Reason for CRM: Patient was seen last week on Wednesday has bronchitis, she states that she still has a cough and has been short of breath she has finished her Prednisone, taking Mucinex she has asthma, please advise if she needs more medications or does she need to be seen again  Best call back is [phone]

## 2018-08-26 ENCOUNTER — Ambulatory Visit: Payer: Managed Care, Other (non HMO) | Admitting: Family Medicine

## 2018-09-23 ENCOUNTER — Encounter: Payer: Self-pay | Admitting: Family Medicine

## 2018-09-23 MED ORDER — PREDNISONE 10 MG PO TABS: 10.0000 mg | ORAL_TABLET | Freq: Two times a day (BID) | ORAL | 0 refills | Status: DC | PRN

## 2018-09-23 NOTE — Telephone Encounter (Signed)
Call in Prednisone 10 mg to take bid as needed for SOB, #30 with no rf

## 2018-09-23 NOTE — Telephone Encounter (Signed)
Dr. Fry please advise. Thanks  

## 2018-10-05 ENCOUNTER — Encounter: Payer: Self-pay | Admitting: Family Medicine

## 2018-10-05 ENCOUNTER — Other Ambulatory Visit: Payer: Self-pay

## 2018-10-05 ENCOUNTER — Ambulatory Visit (INDEPENDENT_AMBULATORY_CARE_PROVIDER_SITE_OTHER): Payer: Managed Care, Other (non HMO) | Admitting: Family Medicine

## 2018-10-05 DIAGNOSIS — J4520 Mild intermittent asthma, uncomplicated: Secondary | ICD-10-CM | POA: Diagnosis not present

## 2018-10-05 MED ORDER — FLUTICASONE FUROATE-VILANTEROL 200-25 MCG/INH IN AEPB: 1.0000 | INHALATION_SPRAY | Freq: Every day | RESPIRATORY_TRACT | 11 refills | Status: DC

## 2018-10-05 NOTE — Telephone Encounter (Signed)
Set up a Webex for this  

## 2018-10-05 NOTE — Telephone Encounter (Signed)
Called and spoke with pt and she is aware of webex scheduled today at 3:30

## 2018-10-05 NOTE — Progress Notes (Signed)
Subjective:    Patient ID: [name] [name], female    DOB: 10-13-1965, 53 y.o.   MRN: [id number]  HPI Virtual Visit via Video Note  I connected with the patient on 10/05/18 at  3:30 PM EDT by a video enabled telemedicine application and verified that I am speaking with the correct person using two identifiers.  Location patient: home Location provider:work or home office Persons participating in the virtual visit: patient, provider  I discussed the limitations of evaluation and management by telemedicine and the availability of in person appointments. The patient expressed understanding and agreed to proceed.   HPI: Her asthma has been acting up lately, especially if she is outdoors. The pollen in the air has been a problem for her. She uses Breo 100-25 once daily, but when she is outdoors she has to use her rescue inhaler 4-5 times a day. At night she does well. No fever or cough or body aches or NVD.   ROS: See pertinent positives and negatives per HPI.  Past Medical History:  Diagnosis Date  . Allergic rhinitis   . Anxiety disorder   . Asthma   . Depression    sees [facility] and Dr. Nolen Mu  . GERD (gastroesophageal reflux disease)   . UTI (urinary tract infection)     Past Surgical History:  Procedure Laterality Date  . CESAREAN SECTION  2001  . CESAREAN SECTION  2003  . CHOLECYSTECTOMY    . NISSEN FUNDOPLICATION  07/2005   Dr. Daphine Deutscher  . TONSILLECTOMY    . TUBAL LIGATION    . VENTRAL HERNIA REPAIR  1/09   Dr. Daphine Deutscher    Family History  Problem Relation Age of Onset  . Alcohol abuse Unknown   . Arthritis Unknown   . Breast cancer Unknown   . Coronary artery disease Unknown   . Hypertension Unknown   . Rheum arthritis Mother   . Crohn's disease Sister   . Heart attack Paternal Grandfather     SOCIAL HX:    Current Outpatient Medications:  .  albuterol (PROVENTIL HFA;VENTOLIN HFA) 108 (90 Base) MCG/ACT inhaler, INHALE TWO PUFFS BY MOUTH EVERY FOUR  HOURS AS NEEDED, Disp: 25.5 Inhaler, Rfl: 3 .  cetirizine-pseudoephedrine (ZYRTEC-D) 5-120 MG per tablet, Take 1 tablet by mouth daily.  , Disp: , Rfl:  .  diazepam (VALIUM) 5 MG tablet, Take 1 tablet (5 mg total) by mouth every 8 (eight) hours as needed., Disp: 60 tablet, Rfl: 5 .  fluticasone furoate-vilanterol (BREO ELLIPTA) 200-25 MCG/INH AEPB, Inhale 1 puff into the lungs daily., Disp: 28 each, Rfl: 11 .  HYDROcodone-homatropine (HYCODAN) 5-1.5 MG/5ML syrup, Take 5 mLs by mouth every 8 (eight) hours as needed for cough., Disp: 120 mL, Rfl: 0 .  methylPREDNISolone (MEDROL DOSEPAK) 4 MG TBPK tablet, As directed, Disp: 21 tablet, Rfl: 0 .  pantoprazole (PROTONIX) 40 MG tablet, Take 1 tablet (40 mg total) by mouth 2 (two) times daily., Disp: 180 tablet, Rfl: 3 .  predniSONE (DELTASONE) 10 MG tablet, Take 1 tablet (10 mg total) by mouth 2 (two) times daily as needed (for shortness of breath)., Disp: 30 tablet, Rfl: 0  EXAM:  VITALS per patient if applicable:  GENERAL: alert, oriented, appears well and in no acute distress  HEENT: atraumatic, conjunttiva clear, no obvious abnormalities on inspection of external nose and ears  NECK: normal movements of the head and neck  LUNGS: on inspection no signs of respiratory distress, breathing rate appears normal, no obvious  gross SOB, gasping or wheezing  CV: no obvious cyanosis  MS: moves all visible extremities without noticeable abnormality  PSYCH/NEURO: pleasant and cooperative, no obvious depression or anxiety, speech and thought processing grossly intact  ASSESSMENT AND PLAN: For her asthma, I suggested she wear a mask over her face when she is working outside, at least until the pollen settles down. Also we will increase the Breo to 200-25 one puff daily. Recheck prn.  Gershon Crane, MD  Discussed the following assessment and plan:  No diagnosis found.     I discussed the assessment and treatment plan with the patient. The patient was  provided an opportunity to ask questions and all were answered. The patient agreed with the plan and demonstrated an understanding of the instructions.   The patient was advised to call back or seek an in-person evaluation if the symptoms worsen or if the condition fails to improve as anticipated.  I provided 17 minutes of non-face-to-face time during this encounter.    Review of Systems     Objective:   Physical Exam        Assessment & Plan:

## 2018-10-05 NOTE — Telephone Encounter (Signed)
Dr. Clent Ridges please advise.  She was last seen on 08/27/2018.  thanks

## 2018-11-05 ENCOUNTER — Encounter: Payer: Self-pay | Admitting: Family Medicine

## 2019-01-01 ENCOUNTER — Ambulatory Visit (INDEPENDENT_AMBULATORY_CARE_PROVIDER_SITE_OTHER): Payer: Managed Care, Other (non HMO) | Admitting: Family Medicine

## 2019-01-01 ENCOUNTER — Encounter: Payer: Self-pay | Admitting: Family Medicine

## 2019-01-01 ENCOUNTER — Other Ambulatory Visit: Payer: Self-pay

## 2019-01-01 ENCOUNTER — Ambulatory Visit: Payer: Self-pay | Admitting: Family Medicine

## 2019-01-01 DIAGNOSIS — Z209 Contact with and (suspected) exposure to unspecified communicable disease: Secondary | ICD-10-CM

## 2019-01-01 DIAGNOSIS — Z20828 Contact with and (suspected) exposure to other viral communicable diseases: Secondary | ICD-10-CM | POA: Diagnosis not present

## 2019-01-01 NOTE — Telephone Encounter (Signed)
We had a Doxy visit today

## 2019-01-01 NOTE — Telephone Encounter (Signed)
Appointment made for today will send as FYI.

## 2019-01-01 NOTE — Telephone Encounter (Signed)
Pt. Report she had some indirect COVID 19 exposure from a co-worker. She has some congestion this week and cares for elderly parents. Would like to see about testing. Warm transfer to [facility] for virtual Visit. Answer Assessment - Initial Assessment Questions 1. CLOSE CONTACT: "Who is the person with the confirmed or suspected COVID-19 infection that you were exposed to?"     Co-worker 2. PLACE of CONTACT: "Where were you when you were exposed to COVID-19?" (e.g., home, school, medical waiting room; which city?)     Work 3. TYPE of CONTACT: "How much contact was there?" (e.g., sitting next to, live in same house, work in same office, same building)     Same office 4. DURATION of CONTACT: "How long were you in contact with the COVID-19 patient?" (e.g., a few seconds, passed by person, a few minutes, live with the patient)     Unsure 5. DATE of CONTACT: "When did you have contact with a COVID-19 patient?" (e.g., how many days ago)     Last week 6. TRAVEL: "Have you traveled out of the country recently?" If so, "When and where?"     * Also ask about out-of-state travel, since the CDC has identified some high-risk cities for community spread in the [place].     * Note: Travel becomes less relevant if there is widespread community transmission where the patient lives.     No 7. COMMUNITY SPREAD: "Are there lots of cases of COVID-19 (community spread) where you live?" (See public health department website, if unsure)       Yes 8. SYMPTOMS: "Do you have any symptoms?" (e.g., fever, cough, breathing difficulty)     Congestion this week 9. PREGNANCY OR POSTPARTUM: "Is there any chance you are pregnant?" "When was your last menstrual period?" "Did you deliver in the last 2 weeks?"     No 10. HIGH RISK: "Do you have any heart or lung problems? Do you have a weak immune system?" (e.g., CHF, COPD, asthma, HIV positive, chemotherapy, renal failure, diabetes mellitus, sickle cell anemia)       No  Protocols  used: CORONAVIRUS (COVID-19) EXPOSURE-A-AH

## 2019-01-01 NOTE — Progress Notes (Signed)
Subjective:    Patient ID: [name] [name], female    DOB: 09-Jun-1966, 53 y.o.   MRN: [id number]  HPI Virtual Visit via Video Note  I connected with the patient on 01/01/19 at  2:30 PM EDT by a video enabled telemedicine application and verified that I am speaking with the correct person using two identifiers.  Location patient: home Location provider:work or home office Persons participating in the virtual visit: patient, provider  I discussed the limitations of evaluation and management by telemedicine and the availability of in person appointments. The patient expressed understanding and agreed to proceed.   HPI: Here with questions about the Covid-19 virus and exposure risks. She has learned that a coworker has recently worked in another location and was in contact with a third worker who has tested positive for the virus. Also [name] [name]'s coworker's sister has tested positive. Her coworker has tested negative and she has no symptoms. [name] [name] asks if she should be tested for the virus. [name] [name] feels fine. 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 Also [name] [name] asks if she should be tested for Covid antibodies. She was sick in February for about a week with chest congestion, fever, and a cough. She was treated for bronchitis and she recovered quickly, but she wonders if she could have had the virus at that time.    ROS: See pertinent positives and negatives per HPI.  Past Medical History:  Diagnosis Date  . Allergic rhinitis   . Anxiety disorder   . Asthma   . Depression    se[facility][name] [name] and Dr. Nolen MuMcKinney  . GERD (gastroesophageal reflux disease)   . UTI (urinary tract infection)     Past Surgical History:  Procedure Laterality Date  . CESAREAN SECTION  2001  . CESAREAN SECTION  2003  . CHOLECYSTECTOMY    . NISSEN FUNDOPLICATION  07/2005   Dr. Daphine DeutscherMartin  .  TONSILLECTOMY    . TUBAL LIGATION    . VENTRAL HERNIA REPAIR  1/09   Dr. Daphine DeutscherMartin    Family History  Problem Relation Age of Onset  . Alcohol abuse Unknown   . Arthritis Unknown   . Breast cancer Unknown   . Coronary artery disease Unknown   . Hypertension Unknown   . Rheum arthritis Mother   . Crohn's disease Sister   . Heart attack Paternal Grandfather      Current Outpatient Medications:  .  albuterol (PROVENTIL HFA;VENTOLIN HFA) 108 (90 Base) MCG/ACT inhaler, INHALE TWO PUFFS BY MOUTH EVERY FOUR HOURS AS NEEDED, Disp: 25.5 Inhaler, Rfl: 3 .  cetirizine-pseudoephedrine (ZYRTEC-D) 5-120 MG per tablet, Take 1 tablet by mouth daily.  , Disp: , Rfl:  .  diazepam (VALIUM) 5 MG tablet, Take 1 tablet (5 mg total) by mouth every 8 (eight) hours as needed., Disp: 60 tablet, Rfl: 5 .  fluticasone furoate-vilanterol (BREO ELLIPTA) 200-25 MCG/INH AEPB, Inhale 1 puff into the lungs daily., Disp: 28 each, Rfl: 11 .  HYDROcodone-homatropine (HYCODAN) 5-1.5 MG/5ML syrup, Take 5 mLs by mouth every 8 (eight) hours as needed for cough., Disp: 120 mL, Rfl: 0 .  pantoprazole (PROTONIX) 40 MG tablet, Take 1 tablet (40 mg total) by mouth 2 (two) times daily., Disp: 180 tablet, Rfl: 3  EXAM:  VITALS per patient if applicable:  GENERAL: alert, oriented, appears well and in no acute distress  HEENT: atraumatic, conjunttiva  clear, no obvious abnormalities on inspection of external nose and ears  NECK: normal movements of the head and neck  LUNGS: on inspection no signs of respiratory distress, breathing rate appears normal, no obvious gross SOB, gasping or wheezing  CV: no obvious cyanosis  MS: moves all visible extremities without noticeable abnormality  PSYCH/NEURO: pleasant and cooperative, no obvious depression or anxiety, speech and thought processing grossly intact  ASSESSMENT AND PLAN:  Discussed the following assessment and plan:  Exposure to communicable disease - Plan: SAR CoV2  Serology (COVID 19)AB(IGG)IA     I discussed the assessment and treatment plan with the patient. The patient was provided an opportunity to ask questions and all were answered. The patient agreed with the plan and demonstrated an understanding of the instructions.   The patient was advised to call back or seek an in-person evaluation if the symptoms worsen or if the condition fails to improve as anticipated.     Review of Systems     Objective:   Physical Exam        Assessment & Plan:  I explained that her risk of having the Covid-19 virus now is very low, and that I do not recommend she be tested for the virus now. She agreed. However we will arrange for her to be tested for the Covid antibodies due to a possible past exposure. This will be done next week.  Alysia Penna, MD

## 2019-01-12 ENCOUNTER — Telehealth: Payer: Self-pay | Admitting: Family Medicine

## 2019-01-12 NOTE — Telephone Encounter (Signed)
Pt called covid line to schedule lab appt for antibodies. Informed practice will CB to schedule lab.  CB# [phone]

## 2019-01-13 NOTE — Telephone Encounter (Signed)
Pt will need virtual appt to discuss testing.

## 2019-01-13 NOTE — Telephone Encounter (Signed)
Pt has been scheduled.  °

## 2019-01-15 ENCOUNTER — Other Ambulatory Visit: Payer: Self-pay

## 2019-01-15 ENCOUNTER — Other Ambulatory Visit (INDEPENDENT_AMBULATORY_CARE_PROVIDER_SITE_OTHER): Payer: Managed Care, Other (non HMO)

## 2019-01-15 DIAGNOSIS — Z209 Contact with and (suspected) exposure to unspecified communicable disease: Secondary | ICD-10-CM | POA: Diagnosis not present

## 2019-01-16 LAB — SAR COV2 SEROLOGY (COVID19)AB(IGG),IA: SARS CoV2 AB IGG: NEGATIVE

## 2019-02-01 ENCOUNTER — Encounter: Payer: Self-pay | Admitting: Family Medicine

## 2019-02-01 LAB — HM MAMMOGRAPHY

## 2019-02-02 NOTE — Telephone Encounter (Signed)
Dr Sarajane Jews is out of office until next week.   Certainly symbicort is a good inhaler for asthma supprssion  if you have a coupon   But I woudn' t know the cost  And all inhalers   Higher costs .   If you want   Please send in symbicort  160 2 puffs bid disp 1    No refills or can wait for dr Sarajane Jews to return  For advice

## 2019-02-03 ENCOUNTER — Encounter: Payer: Self-pay | Admitting: Family Medicine

## 2019-02-03 MED ORDER — BUDESONIDE-FORMOTEROL FUMARATE 160-4.5 MCG/ACT IN AERO: 2.0000 | INHALATION_SPRAY | Freq: Two times a day (BID) | RESPIRATORY_TRACT | 0 refills | Status: DC

## 2019-02-04 ENCOUNTER — Encounter: Payer: Self-pay | Admitting: Gastroenterology

## 2019-02-21 ENCOUNTER — Other Ambulatory Visit: Payer: Self-pay | Admitting: Family Medicine

## 2019-02-27 ENCOUNTER — Other Ambulatory Visit: Payer: Self-pay | Admitting: Internal Medicine

## 2019-03-18 ENCOUNTER — Encounter: Payer: Self-pay | Admitting: Emergency Medicine

## 2019-03-22 ENCOUNTER — Encounter: Payer: Self-pay | Admitting: Gastroenterology

## 2019-03-22 ENCOUNTER — Ambulatory Visit (INDEPENDENT_AMBULATORY_CARE_PROVIDER_SITE_OTHER): Payer: Managed Care, Other (non HMO) | Admitting: Gastroenterology

## 2019-03-22 VITALS — BP 118/72 | HR 81 | Temp 98.3°F | Ht 62.0 in | Wt 163.0 lb

## 2019-03-22 DIAGNOSIS — K219 Gastro-esophageal reflux disease without esophagitis: Secondary | ICD-10-CM | POA: Diagnosis not present

## 2019-03-22 DIAGNOSIS — R194 Change in bowel habit: Secondary | ICD-10-CM

## 2019-03-22 MED ORDER — NA SULFATE-K SULFATE-MG SULF 17.5-3.13-1.6 GM/177ML PO SOLN: 1.0000 | ORAL | 0 refills | Status: DC

## 2019-03-22 NOTE — Progress Notes (Signed)
Referring Provider: Lynden AngLane, Elizabeth, NP Primary Care Physician:  Nelwyn SalisburyFry, Stephen A, MD  Reason for Consultation: Constipation and GERD   IMPRESSION:  Reflux, recurrent 2 years after Nissen fundoplication Intermittent solid food dysphagia localized to the sternal notch Change in bowel habits Upper abdominal fullness AlkaSeltzer PRN for abdominal pain and reflux Nissen fundoplication 2007 History of delayed gastric emptying in 2005: 46% retained tracer in the stomach at 2 hours Lap chol 2006 for biliary dyskinesia Normal screening colonoscopy 2011 No known family history of colon cancer or polyps Sister with Crohn's disease  Recent exacerbation in reflux with weight gain and intermittent solid food dysphagia. Will increase pantoprazole to twice daily as a standing dose. Plan EGD to evaluate for other etiologies such as unwrapped Nissen, GERD-related stricture, ring, web, and/or eosinophilic esophagitis. If negative, would consider TIF.   Colonoscopy recommended given the chang in bowel habits with a trend towards constipation. Trial of daily Citrucel in the meantime.   PLAN: Pantoprazole 40mg  BID Work to maintain a health weight as this will likely improve the reflux Daily trial of Citracel Add a daily probiotic such as Align EGD and colonoscopy [facility] as she does not want to do Weight Watchers again  Please see the "Patient Instructions" section for addition details about the plan.  HPI: [name] Lemmaaula F [name] is a 53 y.o. female referred by NP Maurice MarchLane for further evaluation of constipation and GERD.  The history is obtained through the patient review of her electronic health record.  Previously evaluated by Dr. Juanda ChanceBrodie. She has a history of anxiety, asthma, depression. She has severe gastroesophageal reflux and had a Nissen fundoplication by Dr. Daphine DeutscherMartin in 2007. She also has a history of delayed gastric emptying in 2005 showing 46% retained tracer  in the stomach in 2 hours. She underwent a laparoscopic cholecystectomy in January 2006 after a HIDA scan showed a decreased ejection fraction of 11.2%.   Weight gain after children were born 16 years ago. Resulted in severe reflux with a severe spell at least weekly. Nissen fundoplication in 2007. Recurrent GERD 2 years ago. Lost weight last year and symptoms improved. But they have worsened since then as she regained weight.  Intermittent dysphagia to solids localized to the sternal notch, particularly if she eats to fast. Chicken, hamburder.  Taking pantoprazole QD and occasionally BID. Unable to identify any triggers. Initially thought it might be caffeine because she was drinking frosted coffee from [employer]. No associated cough. No nocturnal symptoms. No odynophagia. No dysphonia, neck pain, sore throat, or globus.  + dysgeusia on days with severe symptoms. Some shortness of breath that she attributes to asthma and worsened by anxiety.   Uses AlkaSeltzer chewables for reflux or abdominal pain. Apple Cider vinegar provided some relief.   More constipation. She attributes the change to her diet. Can go 2-3 days between bowel movements. Will need to take coffee or Miralax. Some straining. Unsure about a sense of incomplete evacuation.   On Weight Watchers last year. Cut back on sugar. Stopped Weight Watchers last year. No longer working out with the gym shutting down. Difficult when her daughter left for college.   Upper abdominal fullness and pain. She feels like her abdominal wall shape is different.   Last colonoscopy 10 years ago.   A CT Scan in May 2011 showed an irregular area of the cecum as well as in sigmoid colon with mild lymph node enlargement in the right lower quadrant and bilateral  ovarian cysts. Changes were suggestive of Crohn's disease. Her sister has had Crohn disease for 18 years and had surgery on her terminal ileum. A prior CT scan in July 2006 and again in December 2006  showed a right ovarian cyst and dilated esophagus due to a hiatal hernia.   Normal screening colonoscopy with Dr. Juanda Chance 03/14/2010. No evidence for Crohn's disease at that time.  Friend diagnosed with colon cancer when she presented with a bowel obstruction.  Has her wishing for an earlier colonoscopy.   Sister with Crohn's disease. Father with precancerous polyps. There is a family history of breast cancer.  No known family history of colon cancer or polyps. No family history of uterine/endometrial cancer, pancreatic cancer or gastric/stomach cancer.   Past Medical History:  Diagnosis Date  . Allergic rhinitis   . Anxiety disorder   . Asthma   . Breast thickening   . Constipation   . Depression    sees [facility] and Dr. Nolen Mu  . GERD (gastroesophageal reflux disease)   . Obesity   . UTI (urinary tract infection)     Past Surgical History:  Procedure Laterality Date  . CESAREAN SECTION  2001  . CESAREAN SECTION  2003  . CHOLECYSTECTOMY    . NISSEN FUNDOPLICATION  07/2005   Dr. Daphine Deutscher  . TONSILLECTOMY    . TUBAL LIGATION    . VENTRAL HERNIA REPAIR  1/09   Dr. Daphine Deutscher    Current Outpatient Medications  Medication Sig Dispense Refill  . albuterol (PROVENTIL HFA;VENTOLIN HFA) 108 (90 Base) MCG/ACT inhaler INHALE TWO PUFFS BY MOUTH EVERY FOUR HOURS AS NEEDED 25.5 Inhaler 3  . cetirizine-pseudoephedrine (ZYRTEC-D) 5-120 MG per tablet Take 1 tablet by mouth daily.      . diazepam (VALIUM) 5 MG tablet Take 1 tablet (5 mg total) by mouth every 8 (eight) hours as needed. 60 tablet 5  . fluticasone furoate-vilanterol (BREO ELLIPTA) 200-25 MCG/INH AEPB Inhale 1 puff into the lungs daily. 28 each 11  . HYDROcodone-homatropine (HYCODAN) 5-1.5 MG/5ML syrup Take 5 mLs by mouth every 8 (eight) hours as needed for cough. 120 mL 0  . pantoprazole (PROTONIX) 40 MG tablet TAKE 1 TABLET BY MOUTH TWICE A DAY 60 tablet 2  . SYMBICORT 160-4.5 MCG/ACT inhaler TAKE 2 PUFFS BY MOUTH TWICE A DAY  10.2 Inhaler 0   No current facility-administered medications for this visit.     Allergies as of 03/22/2019 - Review Complete 01/01/2019  Allergen Reaction Noted  . Penicillins    . Sulfonamide derivatives      Family History  Problem Relation Age of Onset  . Alcohol abuse Other   . Arthritis Other   . Breast cancer Other        maternal  aunt  . Coronary artery disease Other   . Hypertension Other   . Rheum arthritis Mother   . Breast cancer Mother   . Crohn's disease Sister   . Anemia Sister   . Heart attack Paternal Grandfather   . Breast cancer Maternal Grandmother   . Hypertension Maternal Grandmother     Social History   Socioeconomic History  . Marital status: Married    Spouse name: Not on file  . Number of children: 2  . Years of education: Not on file  . Highest education level: Not on file  Occupational History  . Not on file  Social Needs  . Financial resource strain: Not on file  . Food insecurity  Worry: Not on file    Inability: Not on file  . Transportation needs    Medical: Not on file    Non-medical: Not on file  Tobacco Use  . Smoking status: Never Smoker  . Smokeless tobacco: Never Used  Substance and Sexual Activity  . Alcohol use: No    Alcohol/week: 0.0 standard drinks  . Drug use: No  . Sexual activity: Not on file  Lifestyle  . Physical activity    Days per week: Not on file    Minutes per session: Not on file  . Stress: Not on file  Relationships  . Social Herbalist on phone: Not on file    Gets together: Not on file    Attends religious service: Not on file    Active member of club or organization: Not on file    Attends meetings of clubs or organizations: Not on file    Relationship status: Not on file  . Intimate partner violence    Fear of current or ex partner: Not on file    Emotionally abused: Not on file    Physically abused: Not on file    Forced sexual activity: Not on file  Other Topics Concern   . Not on file  Social History Narrative   Stay-at-home mom   Daily caffeine use 2          Review of Systems: 12 system ROS is negative except as noted above with the addition of anxiety and urinary leakage.  Physical Exam: General:   Alert,  well-nourished, pleasant and cooperative in NAD Head:  Normocephalic and atraumatic. Eyes:  Sclera clear, no icterus.   Conjunctiva pink. Ears:  Normal auditory acuity. Nose:  No deformity, discharge,  or lesions. Mouth:  No deformity or lesions.   Neck:  Supple; no masses or thyromegaly. Lungs:  Clear throughout to auscultation.   No wheezes. Heart:  Regular rate and rhythm; no murmurs. Abdomen:  Soft,nontender, nondistended, normal bowel sounds, no rebound or guarding. No hepatosplenomegaly.  Well-healed surgical scars from cholecystectomy. Midline abdominal scar from herniorraphy.  Rectal:  Deferred  Msk:  Symmetrical. No boney deformities LAD: No inguinal or umbilical LAD Extremities:  No clubbing or edema. Neurologic:  Alert and  oriented x4;  grossly nonfocal Skin:  Intact without significant lesions or rashes. Psych:  Alert and cooperative. Normal mood and affect.     Milee Qualls L. Tarri Glenn, MD, MPH 03/22/2019, 11:58 AM

## 2019-03-22 NOTE — Patient Instructions (Addendum)
A daily probiotic may be very helpful. I like Align but there is no data that supports one probiotic over the others.   Resume pantoprazole 40 mg twice daily.   Please start using Metamucil or Citracel every day to try to improve your constipation.   I have recommended a colonoscopy and an upper endoscopy.   Tips for colonoscopy:  - Stay well hydrated for 3-4 days prior to the exam. This reduces nausea and dehydration.  - To prevent skin/hemorrhoid irritation - prior to wiping, put A&Dointment or vaseline on the toilet paper. - Keep a towel or pad on the bed.  - Drink  64oz of clear liquids in the morning of prep day (prior to starting the prep) to be sure that there is enough fluid to flush the colon and stay hydrated!!!! This is in addition to the fluids required for preparation. - Use of a flavored hard candy, such as grape Anise Salvo, can counteract some of the flavor of the prep and may prevent some nausea.

## 2019-03-27 ENCOUNTER — Other Ambulatory Visit: Payer: Self-pay | Admitting: Internal Medicine

## 2019-03-29 NOTE — Telephone Encounter (Signed)
Fry patient.  

## 2019-04-02 ENCOUNTER — Encounter: Payer: Self-pay | Admitting: Gastroenterology

## 2019-04-09 ENCOUNTER — Other Ambulatory Visit: Payer: Self-pay | Admitting: Family Medicine

## 2019-04-15 ENCOUNTER — Telehealth: Payer: Self-pay

## 2019-04-15 NOTE — Telephone Encounter (Signed)
Pt answered "NO" to all Covid Questions °

## 2019-04-15 NOTE — Telephone Encounter (Signed)
Covid-19 screening questions   Do you now or have you had a fever in the last 14 days?  Do you have any respiratory symptoms of shortness of breath or cough now or in the last 14 days?  Do you have any family members or close contacts with diagnosed or suspected Covid-19 in the past 14 days?  Have you been tested for Covid-19 and found to be positive?       

## 2019-04-16 ENCOUNTER — Other Ambulatory Visit: Payer: Self-pay

## 2019-04-16 ENCOUNTER — Other Ambulatory Visit: Payer: Self-pay | Admitting: Gastroenterology

## 2019-04-16 ENCOUNTER — Encounter: Payer: Self-pay | Admitting: Gastroenterology

## 2019-04-16 ENCOUNTER — Ambulatory Visit (AMBULATORY_SURGERY_CENTER): Payer: Managed Care, Other (non HMO) | Admitting: Gastroenterology

## 2019-04-16 VITALS — BP 132/77 | HR 163 | Temp 97.9°F | Resp 13 | Ht 62.0 in | Wt 163.0 lb

## 2019-04-16 DIAGNOSIS — K2980 Duodenitis without bleeding: Secondary | ICD-10-CM | POA: Diagnosis not present

## 2019-04-16 DIAGNOSIS — Z1211 Encounter for screening for malignant neoplasm of colon: Secondary | ICD-10-CM | POA: Diagnosis present

## 2019-04-16 DIAGNOSIS — K219 Gastro-esophageal reflux disease without esophagitis: Secondary | ICD-10-CM

## 2019-04-16 DIAGNOSIS — K2950 Unspecified chronic gastritis without bleeding: Secondary | ICD-10-CM

## 2019-04-16 DIAGNOSIS — R194 Change in bowel habit: Secondary | ICD-10-CM

## 2019-04-16 MED ORDER — SODIUM CHLORIDE 0.9 % IV SOLN: 500.0000 mL | Freq: Once | INTRAVENOUS | Status: DC

## 2019-04-16 NOTE — Op Note (Addendum)
[facility] Patient Name: [name] [name] Procedure Date: 04/16/2019 1:21 PM MRN: [id number] Endoscopist: Thornton Park MD, MD Age: 53 Referring MD:  Date of Birth: 1966/03/27 Gender: Female Account #: [id number] Procedure:                Upper GI endoscopy Indications:              Dysphagia, Suspected esophageal reflux                           Reflux, recurrent 2 years after Nissen                            fundoplication                           Intermittent solid food dysphagia localized to the                            sternal notch                           Upper abdominal fullness                           AlkaSeltzer PRN for abdominal pain and reflux                           Nissen fundoplication 7654                           History of delayed gastric emptying in 2005: 46%                            retained tracer in the stomach at 2 hours                           Lap chol 2006 for biliary dyskinesia Medicines:                See the Anesthesia note for documentation of the                            administered medications Procedure:                Pre-Anesthesia Assessment:                           - Prior to the procedure, a History and Physical                            was performed, and patient medications and                            allergies were reviewed. The patient's tolerance of                            previous anesthesia was also reviewed. The risks  and benefits of the procedure and the sedation                            options and risks were discussed with the patient.                            All questions were answered, and informed consent                            was obtained. Prior Anticoagulants: The patient has                            taken no previous anticoagulant or antiplatelet                            agents. ASA Grade Assessment: II - A patient with                            mild  systemic disease. After reviewing the risks                            and benefits, the patient was deemed in                            satisfactory condition to undergo the procedure.                           After obtaining informed consent, the endoscope was                            passed under direct vision. Throughout the                            procedure, the patient's blood pressure, pulse, and                            oxygen saturations were monitored continuously. The                            Endoscope was introduced through the mouth, and                            advanced to the second part of duodenum. The upper                            GI endoscopy was accomplished without difficulty.                            The patient tolerated the procedure well. Scope In: Scope Out: Findings:                 The examined esophagus was normal. Biopsies were  obtained from the proximal and distal esophagus                            with cold forceps for histology of suspected                            eosinophilic esophagitis and reflux. The                            fundoplication wrap appears slightly unwrapped.                           Diffuse mildly erythematous mucosa without bleeding                            was found in the gastric body and in the gastric                            antrum. Biopsies were taken with a cold forceps for                            histology. Estimated blood loss was minimal.                           The examined duodenum was normal. Biopsies were                            taken with a cold forceps for histology. Estimated                            blood loss was minimal. Complications:            No immediate complications. Estimated blood loss:                            Minimal. Estimated Blood Loss:     Estimated blood loss was minimal. Impression:               - Normal esophagus. Biopsied.                            - Very mild erythematous mucosa in the gastric body                            and antrum. Biopsied.                           - Normal examined duodenum. Biopsied. Recommendation:           - The patient will be observed post-procedure,                            until all discharge criteria are met.                           - Discharge patient to home.                           -  Resume previous diet today.                           - Continue present medications including                            pantoprazole 40 mg twice daily.                           - No aspirin, ibuprofen, naproxen, or other                            non-steroidal anti-inflammatory drugs.                           - Proceed with colonoscopy as previously planned. Thornton Park MD, MD 04/16/2019 2:26:53 PM This report has been signed electronically.

## 2019-04-16 NOTE — Progress Notes (Signed)
Called to room to assist during endoscopic procedure.  Patient ID and intended procedure confirmed with present staff. Received instructions for my participation in the procedure from the performing physician.  

## 2019-04-16 NOTE — Op Note (Addendum)
[facility] Patient Name: [name] [name] Procedure Date: 04/16/2019 1:20 PM MRN: [id number] Endoscopist: Tressia Danas MD, MD Age: 53 Referring MD:  Date of Birth: 31-May-1966 Gender: Female Account #: [id number] Procedure:                Colonoscopy Indications:              Screening for colorectal malignant neoplasm                           No known family history of colon cancer or polyps                           Sister with Crohn's disease Medicines:                See the Anesthesia note for documentation of the                            administered medications Procedure:                Pre-Anesthesia Assessment:                           - Prior to the procedure, a History and Physical                            was performed, and patient medications and                            allergies were reviewed. The patient's tolerance of                            previous anesthesia was also reviewed. The risks                            and benefits of the procedure and the sedation                            options and risks were discussed with the patient.                            All questions were answered, and informed consent                            was obtained. Prior Anticoagulants: The patient has                            taken no previous anticoagulant or antiplatelet                            agents. ASA Grade Assessment: II - A patient with                            mild systemic disease. After reviewing the risks  and benefits, the patient was deemed in                            satisfactory condition to undergo the procedure.                           After obtaining informed consent, the colonoscope                            was passed under direct vision. Throughout the                            procedure, the patient's blood pressure, pulse, and                            oxygen saturations were monitored  continuously. The                            Colonoscope was introduced through the anus and                            advanced to the descending colon. The dial on the                            scope broke and the colonoscope was withdrawal. A                            replacement colonoscope was then introduced through                            the anus and advanced to the terminal ileum, with                            identification of the appendiceal orifice and IC                            valve. A second forward view of the right colon was                            performed. The colonoscopy was performed with                            difficulty due to significant looping and a                            tortuous colon. Successful completion of the                            procedure was aided by applying abdominal pressure.                            The patient tolerated the procedure well. The  quality of the bowel preparation was excellent. The                            terminal ileum, ileocecal valve, appendiceal                            orifice, and rectum were photographed. Scope In: 1:54:52 PM Scope Out: 2:15:42 PM Scope Withdrawal Time: 0 hours 7 minutes 50 seconds  Total Procedure Duration: 0 hours 20 minutes 50 seconds  Findings:                 The perianal and digital rectal examinations were                            normal.                           Many small and large-mouthed diverticula were found                            in the sigmoid colon, descending colon and                            ascending colon.                           The exam was otherwise without abnormality on                            direct and retroflexion views. No polyps, mass, or                            mucosal abnormalities. Complications:            No immediate complications. Estimated Blood Loss:     Estimated blood loss: none. Impression:                - Diverticulosis in the sigmoid colon, in the                            descending colon and in the ascending colon.                           - The examination was otherwise normal on direct                            and retroflexion views.                           - No specimens collected. Recommendation:           - Discharge patient to home.                           - Resume previous diet.                           - Continue present medications.                           -  Repeat colonoscopy in 10 years for screening                            purposes, earlier with new symptoms. Thornton Park MD, MD 04/16/2019 2:30:18 PM This report has been signed electronically.

## 2019-04-16 NOTE — Progress Notes (Signed)
Pt had abdominal cramping.  Her abdomin was soft but a twisted colon per Dr. Tarri Glenn.  Pt was given levsin s/l and changed her position to hand/knee position.  I patted pt on her back with good results passing flatus.  Pt advised to continue to change position and movement is her friend.  Pt up to the rest room before discharge and passed more flatus.   Pt said her chest felt bloated.  I advised her to drink a small amount of carbonated bev - soda and see if it will help her belch.  To call us if needed. maw

## 2019-04-16 NOTE — Progress Notes (Signed)
Temp check JB/vital check Lee.  Reviewed medical and surgical history with the patient.

## 2019-04-16 NOTE — Progress Notes (Signed)
A/ox3, pleased with MAC, report to RN 

## 2019-04-16 NOTE — Patient Instructions (Addendum)
YOU HAD AN ENDOSCOPIC PROCEDURE TODAY AT THE [facility]:   Refer to the procedure report that was given to you for any specific questions about what was found during the examination.  If the procedure report does not answer your questions, please call your gastroenterologist to clarify.  If you requested that your care partner not be given the details of your procedure findings, then the procedure report has been included in a sealed envelope for you to review at your convenience later.  YOU SHOULD EXPECT: Some feelings of bloating in the abdomen. Passage of more gas than usual.  Walking can help get rid of the air that was put into your GI tract during the procedure and reduce the bloating. If you had a lower endoscopy (such as a colonoscopy or flexible sigmoidoscopy) you may notice spotting of blood in your stool or on the toilet paper. If you underwent a bowel prep for your procedure, you may not have a normal bowel movement for a few days.  Please Note:  You might notice some irritation and congestion in your nose or some drainage.  This is from the oxygen used during your procedure.  There is no need for concern and it should clear up in a day or so.  SYMPTOMS TO REPORT IMMEDIATELY:   Following lower endoscopy (colonoscopy or flexible sigmoidoscopy):  Excessive amounts of blood in the stool  Significant tenderness or worsening of abdominal pains  Swelling of the abdomen that is new, acute  Fever of 100F or higher   Following upper endoscopy (EGD)  Vomiting of blood or coffee ground material  New chest pain or pain under the shoulder blades  Painful or persistently difficult swallowing  New shortness of breath  Fever of 100F or higher  Black, tarry-looking stools  For urgent or emergent issues, a gastroenterologist can be reached at any hour by calling [phone].   DIET:  We do recommend a small meal at first, but then you may proceed to your regular diet.  Drink  plenty of fluids but you should avoid alcoholic beverages for 24 hours.  ACTIVITY:  You should plan to take it easy for the rest of today and you should NOT DRIVE or use heavy machinery until tomorrow (because of the sedation medicines used during the test).    FOLLOW UP: Our staff will call the number listed on your records 48-72 hours following your procedure to check on you and address any questions or concerns that you may have regarding the information given to you following your procedure. If we do not reach you, we will leave a message.  We will attempt to reach you two times.  During this call, we will ask if you have developed any symptoms of COVID 19. If you develop any symptoms (ie: fever, flu-like symptoms, shortness of breath, cough etc.) before then, please call [phone].  If you test positive for Covid 19 in the 2 weeks post procedure, please call and report this information to us.    If any biopsies were taken you will be contacted by phone or by letter within the next 1-3 weeks.  Please call us at [phone] if you have not heard about the biopsies in 3 weeks.    SIGNATURES/CONFIDENTIALITY: You and/or your care partner have signed paperwork which will be entered into your electronic medical record.  These signatures attest to the fact that that the information above on your After Visit Summary has been reviewed and is   understood.  Full responsibility of the confidentiality of this discharge information lies with you and/or your care-partner.    Handout was given to you on diverticulosis. Continue taking Pantoprazole 40 mg twice daily per Dr. Tarri Glenn. No aspirin, ibuprofen, naproxen, or other non-steroidal anti-inflammatory drugs. You may resume your other current medications today. Await biopsy results. Please call if any questions or concerns.

## 2019-04-20 ENCOUNTER — Telehealth: Payer: Self-pay | Admitting: *Deleted

## 2019-04-20 NOTE — Telephone Encounter (Signed)
  Follow up Call-  Call back number 04/16/2019  Post procedure Call Back phone  # [phone]  Permission to leave phone message Yes  Some recent data might be hidden     Patient questions:  Do you have a fever, pain , or abdominal swelling? No. Pain Score  0 *  Have you tolerated food without any problems? Yes.    Have you been able to return to your normal activities? Yes.    Do you have any questions about your discharge instructions: Diet   No. Medications  No. Follow up visit  No.  Do you have questions or concerns about your Care? No.  Actions: * If pain score is 4 or above: No action needed, pain <4.  1. Have you developed a fever since your procedure? no  2.   Have you had an respiratory symptoms (SOB or cough) since your procedure? no  3.   Have you tested positive for COVID 19 since your procedure no  4.   Have you had any family members/close contacts diagnosed with the COVID 19 since your procedure?  no   If yes to any of these questions please route to Joylene John, RN and Alphonsa Gin, Therapist, sports.

## 2019-04-23 ENCOUNTER — Encounter: Payer: Self-pay | Admitting: Gastroenterology

## 2019-04-29 ENCOUNTER — Other Ambulatory Visit: Payer: Self-pay | Admitting: Family Medicine

## 2019-05-09 ENCOUNTER — Other Ambulatory Visit: Payer: Self-pay | Admitting: Family Medicine

## 2019-05-10 ENCOUNTER — Telehealth: Payer: Self-pay | Admitting: Family Medicine

## 2019-05-10 ENCOUNTER — Encounter: Payer: Self-pay | Admitting: Family Medicine

## 2019-05-10 NOTE — Telephone Encounter (Signed)
Stop Symbicort and call in Dulera 100-5, 2 puffs BID, #1 with 11 rf

## 2019-05-10 NOTE — Telephone Encounter (Signed)
Please advise 

## 2019-05-10 NOTE — Telephone Encounter (Signed)
Pt stated that the coupon she had been using for Symbicort has been discontinued. She would like to know if [name] [name] would be a suitable alternative as she has found a coupon for that. If not, could he suggest some other alternatives that would have coupons she could use.   Pt also ran out of Symbicort today because she did not know the coupon had been discontinued. Please advise  [id number] [place] [name], [place] [phone] (Phone) [phone] (Fax)

## 2019-05-11 ENCOUNTER — Other Ambulatory Visit: Payer: Self-pay

## 2019-05-11 MED ORDER — DULERA 100-5 MCG/ACT IN AERO: 2.0000 | INHALATION_SPRAY | Freq: Two times a day (BID) | RESPIRATORY_TRACT | 11 refills | Status: DC

## 2019-05-11 NOTE — Telephone Encounter (Signed)
Rx sent to pharmacy pt notified of update. Rx d/c on med list

## 2019-05-18 ENCOUNTER — Encounter: Payer: Self-pay | Admitting: Family Medicine

## 2019-05-18 ENCOUNTER — Telehealth (INDEPENDENT_AMBULATORY_CARE_PROVIDER_SITE_OTHER): Payer: Managed Care, Other (non HMO) | Admitting: Family Medicine

## 2019-05-18 ENCOUNTER — Telehealth: Payer: Self-pay | Admitting: Gastroenterology

## 2019-05-18 ENCOUNTER — Other Ambulatory Visit: Payer: Self-pay

## 2019-05-18 DIAGNOSIS — K219 Gastro-esophageal reflux disease without esophagitis: Secondary | ICD-10-CM

## 2019-05-18 MED ORDER — BUDESONIDE-FORMOTEROL FUMARATE 160-4.5 MCG/ACT IN AERO: 2.0000 | INHALATION_SPRAY | Freq: Two times a day (BID) | RESPIRATORY_TRACT | 3 refills | Status: DC

## 2019-05-18 MED ORDER — METOCLOPRAMIDE HCL 10 MG PO TABS: 10.0000 mg | ORAL_TABLET | Freq: Three times a day (TID) | ORAL | 0 refills | Status: DC

## 2019-05-18 MED ORDER — BUDESONIDE-FORMOTEROL FUMARATE 160-4.5 MCG/ACT IN AERO: 2.0000 | INHALATION_SPRAY | Freq: Two times a day (BID) | RESPIRATORY_TRACT | 11 refills | Status: DC

## 2019-05-18 NOTE — Progress Notes (Signed)
Virtual Visit via Video Note  I connected with the patient on 05/18/19 at  9:45 AM EST by a video enabled telemedicine application and verified that I am speaking with the correct person using two identifiers.  Location patient: home Location provider:work or home office Persons participating in the virtual visit: patient, provider  I discussed the limitations of evaluation and management by telemedicine and the availability of in person appointments. The patient expressed understanding and agreed to proceed.   HPI: Here to discuss upper abdominal pain that started last night after dinner and has persisted since then. She ate a meal of a black bean burger and fried potato chips dipped in ranch dressing. The pain was more severe last night but is much less intense today. She also feels heartburn in the chest area. No fever or cough or SOB. She takes Protonix BID. She recently had an upper endoscopy per Dr. Orvan Falconer and it appeared that the wrap from her Nissin fundiplication has become partially unwrapped.    ROS: See pertinent positives and negatives per HPI.  Past Medical History:  Diagnosis Date  . Allergic rhinitis   . Anxiety disorder   . Asthma   . Breast thickening   . Constipation   . Depression    sees [facility] and Dr. Nolen Mu  . GERD (gastroesophageal reflux disease)   . Obesity   . UTI (urinary tract infection)     Past Surgical History:  Procedure Laterality Date  . CESAREAN SECTION  2001  . CESAREAN SECTION  2003  . CHOLECYSTECTOMY    . NISSEN FUNDOPLICATION  07/2005   Dr. Daphine Deutscher  . TONSILLECTOMY    . TUBAL LIGATION    . VENTRAL HERNIA REPAIR  1/09   Dr. Daphine Deutscher    Family History  Problem Relation Age of Onset  . Alcohol abuse Other   . Arthritis Other   . Breast cancer Other        maternal  aunt  . Coronary artery disease Other   . Hypertension Other   . Rheum arthritis Mother   . Breast cancer Mother   . Diverticulitis Mother   . Crohn's  disease Sister   . Anemia Sister   . Heart attack Paternal Grandfather   . Breast cancer Maternal Grandmother   . Hypertension Maternal Grandmother   . COPD Father   . Colon cancer Neg Hx   . Esophageal cancer Neg Hx   . Rectal cancer Neg Hx   . Stomach cancer Neg Hx      Current Outpatient Medications:  .  albuterol (VENTOLIN HFA) 108 (90 Base) MCG/ACT inhaler, INHALE TWO PUFFS BY MOUTH EVERY FOUR HOURS AS NEEDED, Disp: 8.5 g, Rfl: 11 .  budesonide-formoterol (SYMBICORT) 160-4.5 MCG/ACT inhaler, Inhale 2 puffs into the lungs 2 (two) times daily., Disp: 1 Inhaler, Rfl: 11 .  cetirizine-pseudoephedrine (ZYRTEC-D) 5-120 MG per tablet, Take 1 tablet by mouth daily.  , Disp: , Rfl:  .  diazepam (VALIUM) 5 MG tablet, Take 1 tablet (5 mg total) by mouth every 8 (eight) hours as needed., Disp: 60 tablet, Rfl: 5 .  metoCLOPramide (REGLAN) 10 MG tablet, Take 1 tablet (10 mg total) by mouth 4 (four) times daily -  before meals and at bedtime., Disp: 120 tablet, Rfl: 0 .  pantoprazole (PROTONIX) 40 MG tablet, TAKE 1 TABLET BY MOUTH TWICE A DAY, Disp: 60 tablet, Rfl: 2  EXAM:  VITALS per patient if applicable:  GENERAL: alert, oriented, appears well and in  no acute distress  HEENT: atraumatic, conjunttiva clear, no obvious abnormalities on inspection of external nose and ears  NECK: normal movements of the head and neck  LUNGS: on inspection no signs of respiratory distress, breathing rate appears normal, no obvious gross SOB, gasping or wheezing  CV: no obvious cyanosis  MS: moves all visible extremities without noticeable abnormality  PSYCH/NEURO: pleasant and cooperative, no obvious depression or anxiety, speech and thought processing grossly intact  ASSESSMENT AND PLAN: She has had a bout of uncontrolled GERD, and no doubt the foods she ate last night were a contributing factor. She realizes that she cannot eat fried foods any more. She is on maximum acid suppression so I think her  reflux is more of a mechanical issue now. She will make an appt to see Dr. Tarri Glenn again sometime soon, but in the meantime she will add Reglan 10 mg QID. Recheck with me prn. Alysia Penna, MD  Discussed the following assessment and plan:  No diagnosis found.     I discussed the assessment and treatment plan with the patient. The patient was provided an opportunity to ask questions and all were answered. The patient agreed with the plan and demonstrated an understanding of the instructions.   The patient was advised to call back or seek an in-person evaluation if the symptoms worsen or if the condition fails to improve as anticipated.

## 2019-05-18 NOTE — Telephone Encounter (Signed)
Spoke to the patient who originally wanted a same day appt with GI, when this could not happen she scheduled a virtual appt with her PCP today at 9:45 am. She reports last night she ate fried potato chips, and 1/2 of a black bean burger with cheese. She reports while sleeping, she was awaken with pain, midline, behind and immediately below the sternum and upper part of the stomach. She has continued to take Pantoprazole 40 mg BID, she also took Gas-X and Alka-Seltzer last night for relief.   When asked if the patient wanted to schedule an appointment with an extender for next week, she declined stating she would take the advice of her PCP who she has been with for years.

## 2019-05-18 NOTE — Telephone Encounter (Signed)
Noted. Thanks. I hope that she's feeling better.

## 2019-07-01 ENCOUNTER — Telehealth: Payer: Self-pay | Admitting: *Deleted

## 2019-07-01 NOTE — Telephone Encounter (Signed)
Spoke with the patient. She stated that her and her daughter are not having any symptoms. Covid protocol was discussed and information for testing sites were given. Patient has scheduled to have a covid test today and was asked to monitor for symptoms and call back with any changes.

## 2019-07-01 NOTE — Telephone Encounter (Signed)
Patient called after hours line. Patient reports she has a friend she was with last Wednesday who was dx with COVID today. Health Department is saying it was probably transmitted Mon or Tues. Does caller need to be tested? In addition to that her daughter was with her friend's daughter on Monday. No current symptoms.

## 2019-07-23 ENCOUNTER — Other Ambulatory Visit: Payer: Self-pay | Admitting: Family Medicine

## 2019-07-26 NOTE — Telephone Encounter (Signed)
Last filled 06/16/18 Last OV 05/18/2019  Ok to fill?

## 2019-08-31 ENCOUNTER — Telehealth: Payer: Self-pay | Admitting: Family Medicine

## 2019-08-31 NOTE — Telephone Encounter (Signed)
Pt is having pain with urination; she scheduled for  02/24//2021 @3 :00 pm   with Dr. . Should she keep this appt or come in to do a urinalysis? very painful when urinating. Please advise  contact [phone] pt would like a call

## 2019-09-01 ENCOUNTER — Other Ambulatory Visit: Payer: Self-pay

## 2019-09-01 ENCOUNTER — Telehealth (INDEPENDENT_AMBULATORY_CARE_PROVIDER_SITE_OTHER): Payer: Managed Care, Other (non HMO) | Admitting: Family Medicine

## 2019-09-01 DIAGNOSIS — N390 Urinary tract infection, site not specified: Secondary | ICD-10-CM | POA: Diagnosis not present

## 2019-09-01 MED ORDER — CIPROFLOXACIN HCL 500 MG PO TABS: 500.0000 mg | ORAL_TABLET | Freq: Two times a day (BID) | ORAL | 0 refills | Status: DC

## 2019-09-01 NOTE — Progress Notes (Signed)
Virtual Visit via Telephone Note  I connected with the patient on 09/01/19 at  3:00 PM EST by telephone and verified that I am speaking with the correct person using two identifiers.   I discussed the limitations, risks, security and privacy concerns of performing an evaluation and management service by telephone and the availability of in person appointments. I also discussed with the patient that there may be a patient responsible charge related to this service. The patient expressed understanding and agreed to proceed.  Location patient: home Location provider: work or home office Participants present for the call: patient, provider Patient did not have a visit in the prior 7 days to address this/these issue(s).   History of Present Illness: Here for 3 days of chills, body aches, and pelvic pain with urination. No fever or nausea. [facility] She is drinking lots of water and taking tylenol.    Observations/Objective: Patient sounds cheerful and well on the phone. I do not appreciate any SOB. Speech and thought processing are grossly intact. Patient reported vitals:  Assessment and Plan: UTI, treat with Cipro. Recheck as needed.  Gershon Crane, MD   Follow Up Instructions:     [phone] 5-10 [phone] 11-20 [id number] 21-30 I did not refer this patient for an OV in the next 24 hours for this/these issue(s).  I discussed the assessment and treatment plan with the patient. The patient was provided an opportunity to ask questions and all were answered. The patient agreed with the plan and demonstrated an understanding of the instructions.   The patient was advised to call back or seek an in-person evaluation if the symptoms worsen or if the condition fails to improve as anticipated.  I provided 11 minutes of non-face-to-face time during this encounter.   Gershon Crane, MD

## 2019-09-02 NOTE — Telephone Encounter (Signed)
She was seen virtually

## 2019-11-12 ENCOUNTER — Ambulatory Visit: Payer: Managed Care, Other (non HMO) | Admitting: Family Medicine

## 2019-11-12 ENCOUNTER — Other Ambulatory Visit: Payer: Self-pay

## 2020-02-16 ENCOUNTER — Telehealth: Payer: Self-pay | Admitting: Family Medicine

## 2020-02-16 MED ORDER — BUDESONIDE-FORMOTEROL FUMARATE 160-4.5 MCG/ACT IN AERO: 2.0000 | INHALATION_SPRAY | Freq: Two times a day (BID) | RESPIRATORY_TRACT | 3 refills | Status: DC

## 2020-02-16 NOTE — Telephone Encounter (Signed)
Rx sent in. Spoke with the patient. She is aware her Rx has been sent in.

## 2020-02-16 NOTE — Telephone Encounter (Signed)
Pt called to let Dr. Ashley Mariner that she has requested a 90 day supply of budesonide-formoterol (SYMBICORT) 160-4.5 MCG/ACT inhaler   From pharmacy from online prescription [facility].

## 2020-05-25 ENCOUNTER — Telehealth: Payer: Self-pay | Admitting: Family Medicine

## 2020-05-25 ENCOUNTER — Other Ambulatory Visit: Payer: Self-pay | Admitting: Family Medicine

## 2020-05-25 NOTE — Telephone Encounter (Signed)
pt need a refill on the medication  budesonide-formoterol (SYMBICORT) 160-4.5 MCG/ACT inhaler [id number] [place], [place] - [address]  Phone:  [phone] Fax:  [phone]

## 2020-05-26 MED ORDER — BUDESONIDE-FORMOTEROL FUMARATE 160-4.5 MCG/ACT IN AERO: 2.0000 | INHALATION_SPRAY | Freq: Two times a day (BID) | RESPIRATORY_TRACT | 3 refills | Status: DC

## 2020-05-26 NOTE — Telephone Encounter (Signed)
Rx sent in

## 2020-07-27 ENCOUNTER — Encounter: Payer: Self-pay | Admitting: Family Medicine

## 2020-07-27 ENCOUNTER — Telehealth: Payer: Self-pay | Admitting: Family Medicine

## 2020-07-27 DIAGNOSIS — Z113 Encounter for screening for infections with a predominantly sexual mode of transmission: Secondary | ICD-10-CM | POA: Diagnosis not present

## 2020-07-27 DIAGNOSIS — Z6824 Body mass index (BMI) 24.0-24.9, adult: Secondary | ICD-10-CM | POA: Diagnosis not present

## 2020-07-27 DIAGNOSIS — Z1231 Encounter for screening mammogram for malignant neoplasm of breast: Secondary | ICD-10-CM | POA: Diagnosis not present

## 2020-07-27 DIAGNOSIS — Z01419 Encounter for gynecological examination (general) (routine) without abnormal findings: Secondary | ICD-10-CM | POA: Diagnosis not present

## 2020-07-27 DIAGNOSIS — Z01411 Encounter for gynecological examination (general) (routine) with abnormal findings: Secondary | ICD-10-CM | POA: Diagnosis not present

## 2020-07-27 NOTE — Telephone Encounter (Signed)
error 

## 2020-08-01 ENCOUNTER — Other Ambulatory Visit: Payer: Self-pay

## 2020-08-01 ENCOUNTER — Telehealth: Payer: Self-pay | Admitting: Family Medicine

## 2020-08-02 ENCOUNTER — Telehealth (INDEPENDENT_AMBULATORY_CARE_PROVIDER_SITE_OTHER): Payer: BC Managed Care – PPO | Admitting: Family Medicine

## 2020-08-02 ENCOUNTER — Encounter: Payer: Self-pay | Admitting: Family Medicine

## 2020-08-02 VITALS — Ht 62.0 in | Wt 126.0 lb

## 2020-08-02 DIAGNOSIS — J4520 Mild intermittent asthma, uncomplicated: Secondary | ICD-10-CM

## 2020-08-02 DIAGNOSIS — Z0000 Encounter for general adult medical examination without abnormal findings: Secondary | ICD-10-CM

## 2020-08-02 DIAGNOSIS — Z209 Contact with and (suspected) exposure to unspecified communicable disease: Secondary | ICD-10-CM

## 2020-08-02 DIAGNOSIS — K219 Gastro-esophageal reflux disease without esophagitis: Secondary | ICD-10-CM

## 2020-08-02 DIAGNOSIS — F411 Generalized anxiety disorder: Secondary | ICD-10-CM

## 2020-08-02 MED ORDER — PANTOPRAZOLE SODIUM 40 MG PO TBEC: 40.0000 mg | DELAYED_RELEASE_TABLET | Freq: Every day | ORAL | 3 refills | Status: DC

## 2020-08-02 MED ORDER — DIAZEPAM 5 MG PO TABS: 5.0000 mg | ORAL_TABLET | Freq: Three times a day (TID) | ORAL | 5 refills | Status: DC | PRN

## 2020-08-02 MED ORDER — ALBUTEROL SULFATE HFA 108 (90 BASE) MCG/ACT IN AERS: 2.0000 | INHALATION_SPRAY | RESPIRATORY_TRACT | 3 refills | Status: DC | PRN

## 2020-08-02 MED ORDER — BUDESONIDE-FORMOTEROL FUMARATE 160-4.5 MCG/ACT IN AERO: 2.0000 | INHALATION_SPRAY | Freq: Two times a day (BID) | RESPIRATORY_TRACT | 3 refills | Status: DC

## 2020-08-02 NOTE — Progress Notes (Signed)
Subjective:    Patient ID: [name] [name], female    DOB: 22-Jun-1966, 55 y.o.   MRN: [id number]  HPI Virtual Visit via Video Note  I connected with the patient on 08/02/20 at  3:30 PM EST by a video enabled telemedicine application and verified that I am speaking with the correct person using two identifiers.  Location patient: home Location provider:work or home office Persons participating in the virtual visit: patient, provider  I discussed the limitations of evaluation and management by telemedicine and the availability of in person appointments. The patient expressed understanding and agreed to proceed.   HPI: Here for refills and other issues. First her son (who lives with her) has been sick for a week with fever and head congestion. He tested positive for the Covid-19 virus 4 days ago, and he has been quarantining at home. Zoey feels fine and she has tested negative twice for the virus. She asks if she can safely return to work tomorrow or should she continue to test herself. Also she needs medication refills. He asthma and GERD have been stable. Her anxiety has its ups and downs but in general this haas been stable. The Diazepam is very helpful. Also she asks to get some screening labs done.    ROS: See pertinent positives and negatives per HPI.  Past Medical History:  Diagnosis Date  . Allergic rhinitis   . Anxiety disorder   . Asthma   . Breast thickening   . Constipation   . Depression    sees [facility] and Dr. Nolen Mu  . GERD (gastroesophageal reflux disease)   . Obesity   . UTI (urinary tract infection)     Past Surgical History:  Procedure Laterality Date  . CESAREAN SECTION  2001  . CESAREAN SECTION  2003  . CHOLECYSTECTOMY    . NISSEN FUNDOPLICATION  07/2005   Dr. Daphine Deutscher  . TONSILLECTOMY    . TUBAL LIGATION    . VENTRAL HERNIA REPAIR  1/09   Dr. Daphine Deutscher    Family History  Problem Relation Age of Onset  . Alcohol abuse Other   . Arthritis  Other   . Breast cancer Other        maternal  aunt  . Coronary artery disease Other   . Hypertension Other   . Rheum arthritis Mother   . Breast cancer Mother   . Diverticulitis Mother   . Crohn's disease Sister   . Anemia Sister   . Heart attack Paternal Grandfather   . Breast cancer Maternal Grandmother   . Hypertension Maternal Grandmother   . COPD Father   . Colon cancer Neg Hx   . Esophageal cancer Neg Hx   . Rectal cancer Neg Hx   . Stomach cancer Neg Hx      Current Outpatient Medications:  .  cetirizine-pseudoephedrine (ZYRTEC-D) 5-120 MG per tablet, Take 1 tablet by mouth daily., Disp: , Rfl:  .  metoCLOPramide (REGLAN) 10 MG tablet, Take 1 tablet (10 mg total) by mouth 4 (four) times daily -  before meals and at bedtime., Disp: 120 tablet, Rfl: 0 .  albuterol (VENTOLIN HFA) 108 (90 Base) MCG/ACT inhaler, Inhale 2 puffs into the lungs every 4 (four) hours as needed for wheezing or shortness of breath., Disp: 54 g, Rfl: 3 .  budesonide-formoterol (SYMBICORT) 160-4.5 MCG/ACT inhaler, Inhale 2 puffs into the lungs 2 (two) times daily., Disp: 3 each, Rfl: 3 .  diazepam (VALIUM) 5 MG tablet, Take 1 tablet (  5 mg total) by mouth every 8 (eight) hours as needed for anxiety., Disp: 90 tablet, Rfl: 5 .  pantoprazole (PROTONIX) 40 MG tablet, Take 1 tablet (40 mg total) by mouth daily., Disp: 90 tablet, Rfl: 3  EXAM:  VITALS per patient if applicable:  GENERAL: alert, oriented, appears well and in no acute distress  HEENT: atraumatic, conjunttiva clear, no obvious abnormalities on inspection of external nose and ears  NECK: normal movements of the head and neck  LUNGS: on inspection no signs of respiratory distress, breathing rate appears normal, no obvious gross SOB, gasping or wheezing  CV: no obvious cyanosis  MS: moves all visible extremities without noticeable abnormality  PSYCH/NEURO: pleasant and cooperative, no obvious depression or anxiety, speech and thought  processing grossly intact  ASSESSMENT AND PLAN: She has been exposed to the Covid virus, but she appears to have avoided catching it. I told her that she can return to work tomorrow and that no further testing is needed unless she develops symptoms. We will refill her medication since these issues are stable. Set up fasting labs soon.  Gershon Crane, MD  Discussed the following assessment and plan:  Preventative health care - Plan: Lipid panel, Hepatic function panel, TSH, Basic metabolic panel, T3, free, T4, free, CBC with Differential/Platelet  Anxiety state  Mild intermittent asthma without complication  Gastroesophageal reflux disease, unspecified whether esophagitis present     I discussed the assessment and treatment plan with the patient. The patient was provided an opportunity to ask questions and all were answered. The patient agreed with the plan and demonstrated an understanding of the instructions.   The patient was advised to call back or seek an in-person evaluation if the symptoms worsen or if the condition fails to improve as anticipated.     Review of Systems     Objective:   Physical Exam        Assessment & Plan:

## 2020-08-03 NOTE — Progress Notes (Signed)
Duplicate

## 2020-08-14 ENCOUNTER — Ambulatory Visit: Payer: Self-pay | Admitting: Family Medicine

## 2020-08-14 DIAGNOSIS — Z0289 Encounter for other administrative examinations: Secondary | ICD-10-CM

## 2020-08-22 ENCOUNTER — Ambulatory Visit: Payer: Managed Care, Other (non HMO) | Admitting: Gastroenterology

## 2020-10-09 ENCOUNTER — Encounter: Payer: Self-pay | Admitting: Family Medicine

## 2020-10-09 NOTE — Telephone Encounter (Signed)
Her colonoscopy in 2020 was clear with no polyps, so she will not need to be screened again until 2030

## 2020-10-11 ENCOUNTER — Other Ambulatory Visit (INDEPENDENT_AMBULATORY_CARE_PROVIDER_SITE_OTHER): Payer: BC Managed Care – PPO

## 2020-10-11 ENCOUNTER — Other Ambulatory Visit: Payer: Self-pay

## 2020-10-11 DIAGNOSIS — Z0000 Encounter for general adult medical examination without abnormal findings: Secondary | ICD-10-CM

## 2020-10-11 LAB — BASIC METABOLIC PANEL
BUN: 24 mg/dL — ABNORMAL HIGH (ref 6–23)
CO2: 31 mEq/L (ref 19–32)
Calcium: 9.5 mg/dL (ref 8.4–10.5)
Chloride: 102 mEq/L (ref 96–112)
Creatinine, Ser: 0.66 mg/dL (ref 0.40–1.20)
GFR: 100.58 mL/min (ref >60.00)
Glucose, Bld: 85 mg/dL (ref 70–99)
Potassium: 4.2 mEq/L (ref 3.5–5.1)
Sodium: 140 mEq/L (ref 135–145)

## 2020-10-11 LAB — CBC WITH DIFFERENTIAL/PLATELET
Basophils Absolute: 0.1 10*3/uL (ref 0.0–0.1)
Basophils Relative: 1 % (ref 0.0–3.0)
Eosinophils Absolute: 0.1 10*3/uL (ref 0.0–0.7)
Eosinophils Relative: 2.1 % (ref 0.0–5.0)
HCT: 42 % (ref 36.0–46.0)
Hemoglobin: 14.2 g/dL (ref 12.0–15.0)
Lymphocytes Relative: 37.4 % (ref 12.0–46.0)
Lymphs Abs: 2.2 10*3/uL (ref 0.7–4.0)
MCHC: 34 g/dL (ref 30.0–36.0)
MCV: 91.9 fl (ref 78.0–100.0)
Monocytes Absolute: 0.6 10*3/uL (ref 0.1–1.0)
Monocytes Relative: 9.8 % (ref 3.0–12.0)
Neutro Abs: 2.9 10*3/uL (ref 1.4–7.7)
Neutrophils Relative %: 49.7 % (ref 43.0–77.0)
Platelets: 270 10*3/uL (ref 150.0–400.0)
RBC: 4.57 Mil/uL (ref 3.87–5.11)
RDW: 12.4 % (ref 11.5–15.5)
WBC: 5.9 10*3/uL (ref 4.0–10.5)

## 2020-10-11 LAB — HEPATIC FUNCTION PANEL
ALT: 17 U/L (ref 0–35)
AST: 14 U/L (ref 0–37)
Albumin: 4.2 g/dL (ref 3.5–5.2)
Alkaline Phosphatase: 52 U/L (ref 39–117)
Bilirubin, Direct: 0.1 mg/dL (ref 0.0–0.3)
Total Bilirubin: 0.5 mg/dL (ref 0.2–1.2)
Total Protein: 6.4 g/dL (ref 6.0–8.3)

## 2020-10-11 LAB — LIPID PANEL
Cholesterol: 206 mg/dL — ABNORMAL HIGH (ref 0–200)
HDL: 71.5 mg/dL (ref >39.00)
LDL Cholesterol: 122 mg/dL — ABNORMAL HIGH (ref 0–99)
NonHDL: 134.81
Total CHOL/HDL Ratio: 3
Triglycerides: 63 mg/dL (ref 0.0–149.0)
VLDL: 12.6 mg/dL (ref 0.0–40.0)

## 2020-10-11 LAB — TSH: TSH: 3.65 u[IU]/mL (ref 0.35–4.50)

## 2020-10-11 LAB — T4, FREE: Free T4: 0.94 ng/dL (ref 0.60–1.60)

## 2020-10-11 LAB — T3, FREE: T3, Free: 2.7 pg/mL (ref 2.3–4.2)

## 2020-10-13 ENCOUNTER — Encounter: Payer: Self-pay | Admitting: Family Medicine

## 2020-10-13 NOTE — Telephone Encounter (Signed)
Avoid fatty foods like fried foods, heavy sauces and gravies, bacon and sausage, dairy products

## 2021-01-31 DIAGNOSIS — Z20822 Contact with and (suspected) exposure to covid-19: Secondary | ICD-10-CM | POA: Diagnosis not present

## 2021-02-06 DIAGNOSIS — Z20822 Contact with and (suspected) exposure to covid-19: Secondary | ICD-10-CM | POA: Diagnosis not present

## 2021-02-06 DIAGNOSIS — U071 COVID-19: Secondary | ICD-10-CM | POA: Diagnosis not present

## 2021-02-07 DIAGNOSIS — Z711 Person with feared health complaint in whom no diagnosis is made: Secondary | ICD-10-CM | POA: Diagnosis not present

## 2021-02-07 DIAGNOSIS — Z03818 Encounter for observation for suspected exposure to other biological agents ruled out: Secondary | ICD-10-CM | POA: Diagnosis not present

## 2021-02-07 DIAGNOSIS — Z20822 Contact with and (suspected) exposure to covid-19: Secondary | ICD-10-CM | POA: Diagnosis not present

## 2021-02-12 ENCOUNTER — Other Ambulatory Visit: Payer: Self-pay | Admitting: Family Medicine

## 2021-02-13 ENCOUNTER — Other Ambulatory Visit: Payer: Self-pay

## 2021-02-13 ENCOUNTER — Other Ambulatory Visit: Payer: Self-pay | Admitting: Family Medicine

## 2021-02-13 ENCOUNTER — Encounter: Payer: Self-pay | Admitting: Family Medicine

## 2021-02-13 MED ORDER — ALBUTEROL SULFATE HFA 108 (90 BASE) MCG/ACT IN AERS: 2.0000 | INHALATION_SPRAY | RESPIRATORY_TRACT | 1 refills | Status: DC | PRN

## 2021-02-13 NOTE — Telephone Encounter (Signed)
Dr. Clent Ridges insurance will only pay for ProAir, is it okay to change?  Patient has been on ProAir in past.   Please advise.

## 2021-07-20 ENCOUNTER — Encounter: Payer: Self-pay | Admitting: Family Medicine

## 2021-07-20 ENCOUNTER — Other Ambulatory Visit: Payer: Self-pay | Admitting: Family Medicine

## 2021-07-20 ENCOUNTER — Other Ambulatory Visit: Payer: Self-pay

## 2021-07-20 ENCOUNTER — Ambulatory Visit: Payer: BC Managed Care – PPO | Admitting: Family Medicine

## 2021-07-20 VITALS — BP 118/80 | HR 91 | Temp 98.2°F | Ht 62.0 in | Wt 127.2 lb

## 2021-07-20 DIAGNOSIS — L309 Dermatitis, unspecified: Secondary | ICD-10-CM | POA: Diagnosis not present

## 2021-07-20 DIAGNOSIS — J4520 Mild intermittent asthma, uncomplicated: Secondary | ICD-10-CM

## 2021-07-20 MED ORDER — FLUCONAZOLE 150 MG PO TABS: 150.0000 mg | ORAL_TABLET | Freq: Once | ORAL | 0 refills | Status: AC

## 2021-07-20 MED ORDER — TRIAMCINOLONE ACETONIDE 0.5 % EX OINT: 1.0000 "application " | TOPICAL_OINTMENT | Freq: Two times a day (BID) | CUTANEOUS | 0 refills | Status: DC

## 2021-07-20 MED ORDER — DIAZEPAM 5 MG PO TABS: 5.0000 mg | ORAL_TABLET | Freq: Three times a day (TID) | ORAL | 5 refills | Status: DC | PRN

## 2021-07-20 MED ORDER — ALBUTEROL SULFATE HFA 108 (90 BASE) MCG/ACT IN AERS: 2.0000 | INHALATION_SPRAY | RESPIRATORY_TRACT | 0 refills | Status: DC | PRN

## 2021-07-20 MED ORDER — PREDNISONE 20 MG PO TABS: ORAL_TABLET | ORAL | 0 refills | Status: DC

## 2021-07-20 NOTE — Telephone Encounter (Signed)
Refills requested.

## 2021-07-20 NOTE — Telephone Encounter (Signed)
Patient scheduled an appointment for 01/20 for a medication refill. The patient has been out of diazepam (VALIUM) 5 MG tablet [id number], budesonide-formoterol (SYMBICORT) 160-4.5 MCG/ACT inhaler [[id number]], and albuterol (VENTOLIN HFA) 108 (90 Base) MCG/ACT inhaler [id number] .  Patient could be contacted at [phone].  Please advise.

## 2021-07-20 NOTE — Progress Notes (Signed)
[name] [name] DOB: [birth year] Encounter date: 07/20/2021  This is a 56 y.o. female who presents with Chief Complaint  Patient presents with   Rash    Patient complains of itchy, red rash noted along the right cheek, chest and right arm x2-3 weeks, tried Jock-itch cream with some relief, recurrent after discontinuing    History of present illness: Worried about ringworm because they fostered kittens that had this. Has some dry skin and feels she has spread rash with scratching.   Did fade with the antifungal cream. Dried up, scaled, faded and thought it was gone on face, but came back next day. Has being careful not to rub on areas. Nothing else new in her environment (soaps, lotions, detergent, etc) but does have a lot of stress right now. Did also try hydrocortisone on rash. Also tried apple cider vinegar which helped with itching. But not lasting relief.   Dad passed last Jan; anniversary last week. Emotionally tired.   No other sick symptoms except cold around christmas (not covid). Thinks more asthma flare related to christmas tree. No fevers, feels better now. Asthma never got very triggered with this as it usually does.    Allergies  Allergen Reactions   Penicillins     REACTION: childhood - hives/rash Has patient had a PCN reaction causing immediate rash, facial/tongue/throat swelling, SOB or lightheadedness with hypotension: Yes Has patient had a PCN reaction causing severe rash involving mucus membranes or skin necrosis: Yes Has patient had a PCN reaction that required hospitalization No Has patient had a PCN reaction occurring within the last 10 years: No If all of the above answers are "NO", then may proceed with Cephalosporin use.    Sulfonamide Derivatives     REACTION: childhood - hives/rash   Current Meds  Medication Sig   albuterol (VENTOLIN HFA) 108 (90 Base) MCG/ACT inhaler Inhale 2 puffs into the lungs every 4 (four) hours as needed for wheezing or shortness  of breath.   budesonide-formoterol (SYMBICORT) 160-4.5 MCG/ACT inhaler Inhale 2 puffs into the lungs 2 (two) times daily.   cetirizine-pseudoephedrine (ZYRTEC-D) 5-120 MG per tablet Take 1 tablet by mouth daily.   diazepam (VALIUM) 5 MG tablet Take 1 tablet (5 mg total) by mouth every 8 (eight) hours as needed for anxiety.   pantoprazole (PROTONIX) 40 MG tablet Take 1 tablet (40 mg total) by mouth daily.   predniSONE (DELTASONE) 20 MG tablet Take 3 tablets PO daily x 2 days, then 2 tablets PO daily x 3 days, then 1 tablet PO daily x 3 days, then 1/2 tablet PO daily x 2 days.   triamcinolone ointment (KENALOG) 0.5 % Apply 1 application topically 2 (two) times daily.    Review of Systems  Objective:  BP 118/80 (BP Location: Right Arm, Patient Position: Sitting, Cuff Size: Normal)    Pulse 91    Temp 98.2 F (36.8 C) (Oral)    Ht 5\' 2"  (1.575 m)    Wt 127 lb 3.2 oz (57.7 kg)    BMI 23.27 kg/m   Weight: 127 lb 3.2 oz (57.7 kg)   BP Readings from Last 3 Encounters:  07/20/21 118/80  04/16/19 132/77  03/22/19 118/72   Wt Readings from Last 3 Encounters:  07/20/21 127 lb 3.2 oz (57.7 kg)  08/02/20 126 lb (57.2 kg)  04/16/19 163 lb (73.9 kg)    Physical Exam     Assessment/Plan  1. Dermatitis Prednisone by mouth and then topical treatment on areas of rash.  If any worsening let us know or if not resolving by end of steroid burst.    Return if symptoms worsen or fail to improve.    Micheline Rough, MD

## 2021-07-20 NOTE — Patient Instructions (Signed)
Eucerin original (white base, blue lid), ceravae (or aquaphor/vasoline)

## 2021-07-20 NOTE — Addendum Note (Signed)
Addended by: Wynn Banker on: 07/20/2021 11:13 AM   Modules accepted: Orders

## 2021-07-20 NOTE — Telephone Encounter (Signed)
Done

## 2021-07-27 ENCOUNTER — Ambulatory Visit: Payer: BC Managed Care – PPO | Admitting: Family Medicine

## 2021-08-03 ENCOUNTER — Ambulatory Visit: Payer: BC Managed Care – PPO | Admitting: Family Medicine

## 2021-08-03 ENCOUNTER — Encounter: Payer: Self-pay | Admitting: Family Medicine

## 2021-08-03 VITALS — BP 118/80 | HR 86 | Temp 98.5°F | Wt 126.0 lb

## 2021-08-03 DIAGNOSIS — L42 Pityriasis rosea: Secondary | ICD-10-CM | POA: Diagnosis not present

## 2021-08-03 MED ORDER — BUDESONIDE-FORMOTEROL FUMARATE 160-4.5 MCG/ACT IN AERO: 2.0000 | INHALATION_SPRAY | Freq: Two times a day (BID) | RESPIRATORY_TRACT | 3 refills | Status: DC

## 2021-08-03 MED ORDER — PANTOPRAZOLE SODIUM 40 MG PO TBEC: 40.0000 mg | DELAYED_RELEASE_TABLET | Freq: Every day | ORAL | 3 refills | Status: DC

## 2021-08-03 NOTE — Progress Notes (Signed)
° °  Subjective:    Patient ID: [name] [name], female    DOB: 23-Dec-1965, 56 y.o.   MRN: [id number]  HPI Here for medication refills and to follow up on a rash that appeared about 3 weeks ago. The first spot was on the upper right arm, and this ended up being the largest spot. Soon after she developed other well marginated areas of erythema on both upper arms and the chest. These itched at times. She never had any other symptoms. She was seen here on 07-20-21 and it was felt these were areas of ringworm because she had been caring for some rescued kittens (which are now gone). She was given a single dose of Fluconazole. She also applied OTC antifungal cream and she took a course of Prednisone. Since then the spots have slowly fading away, although she noticed a new spot on the right cheek this morning.    Review of Systems  Constitutional: Negative.   HENT: Negative.    Eyes: Negative.   Respiratory: Negative.    Cardiovascular: Negative.   Skin:  Positive for rash.      Objective:   Physical Exam Constitutional:      Appearance: Normal appearance.  Cardiovascular:     Rate and Rhythm: Normal rate and regular rhythm.     Pulses: Normal pulses.     Heart sounds: Normal heart sounds.  Pulmonary:     Effort: Pulmonary effort is normal.     Breath sounds: Normal breath sounds.  Lymphadenopathy:     Cervical: No cervical adenopathy.  Skin:    Comments: There is a 1 cm area of macular erythema that has a rough texture on the right cheek. Otherwise there are numerous area on the upper arms and chest with very faint pinkish coloration that are consistent with previous spots that are fading away   Neurological:     Mental Status: She is alert.          Assessment & Plan:  On taking another look, this rash in consistent with pityriasis rosea. It has followed the typical course of PR. I advised her that this is self limited and there is no real treatment. She can follow up as  needed. Gershon Crane, MD

## 2021-10-06 DIAGNOSIS — S60011A Contusion of right thumb without damage to nail, initial encounter: Secondary | ICD-10-CM | POA: Diagnosis not present

## 2021-10-06 DIAGNOSIS — L089 Local infection of the skin and subcutaneous tissue, unspecified: Secondary | ICD-10-CM | POA: Diagnosis not present

## 2021-10-09 ENCOUNTER — Ambulatory Visit: Payer: BC Managed Care – PPO | Admitting: Family Medicine

## 2021-10-11 ENCOUNTER — Ambulatory Visit: Payer: BC Managed Care – PPO | Admitting: Family Medicine

## 2021-10-22 DIAGNOSIS — Z01419 Encounter for gynecological examination (general) (routine) without abnormal findings: Secondary | ICD-10-CM | POA: Diagnosis not present

## 2021-10-22 DIAGNOSIS — Z124 Encounter for screening for malignant neoplasm of cervix: Secondary | ICD-10-CM | POA: Diagnosis not present

## 2021-10-22 DIAGNOSIS — Z6824 Body mass index (BMI) 24.0-24.9, adult: Secondary | ICD-10-CM | POA: Diagnosis not present

## 2021-10-22 DIAGNOSIS — Z1231 Encounter for screening mammogram for malignant neoplasm of breast: Secondary | ICD-10-CM | POA: Diagnosis not present

## 2021-10-22 DIAGNOSIS — Z01411 Encounter for gynecological examination (general) (routine) with abnormal findings: Secondary | ICD-10-CM | POA: Diagnosis not present

## 2021-10-22 DIAGNOSIS — Z0142 Encounter for cervical smear to confirm findings of recent normal smear following initial abnormal smear: Secondary | ICD-10-CM | POA: Diagnosis not present

## 2021-10-23 ENCOUNTER — Encounter: Payer: Self-pay | Admitting: Family Medicine

## 2021-10-23 LAB — HM MAMMOGRAPHY

## 2021-11-27 ENCOUNTER — Other Ambulatory Visit: Payer: Self-pay | Admitting: Family Medicine

## 2021-11-27 DIAGNOSIS — F411 Generalized anxiety disorder: Secondary | ICD-10-CM | POA: Diagnosis not present

## 2021-11-27 DIAGNOSIS — J4520 Mild intermittent asthma, uncomplicated: Secondary | ICD-10-CM

## 2021-11-28 ENCOUNTER — Other Ambulatory Visit: Payer: Self-pay

## 2021-11-28 ENCOUNTER — Other Ambulatory Visit: Payer: Self-pay | Admitting: Family Medicine

## 2021-11-28 DIAGNOSIS — J4520 Mild intermittent asthma, uncomplicated: Secondary | ICD-10-CM

## 2021-11-28 MED ORDER — ALBUTEROL SULFATE HFA 108 (90 BASE) MCG/ACT IN AERS: 2.0000 | INHALATION_SPRAY | RESPIRATORY_TRACT | 0 refills | Status: AC | PRN

## 2021-12-04 DIAGNOSIS — F411 Generalized anxiety disorder: Secondary | ICD-10-CM | POA: Diagnosis not present

## 2021-12-11 DIAGNOSIS — F411 Generalized anxiety disorder: Secondary | ICD-10-CM | POA: Diagnosis not present

## 2021-12-18 DIAGNOSIS — F411 Generalized anxiety disorder: Secondary | ICD-10-CM | POA: Diagnosis not present

## 2022-01-01 DIAGNOSIS — F411 Generalized anxiety disorder: Secondary | ICD-10-CM | POA: Diagnosis not present

## 2022-01-07 DIAGNOSIS — H2513 Age-related nuclear cataract, bilateral: Secondary | ICD-10-CM | POA: Diagnosis not present

## 2022-01-07 DIAGNOSIS — H4312 Vitreous hemorrhage, left eye: Secondary | ICD-10-CM | POA: Diagnosis not present

## 2022-01-07 DIAGNOSIS — H33322 Round hole, left eye: Secondary | ICD-10-CM | POA: Diagnosis not present

## 2022-01-07 DIAGNOSIS — H33332 Multiple defects of retina without detachment, left eye: Secondary | ICD-10-CM | POA: Diagnosis not present

## 2022-01-07 DIAGNOSIS — H43813 Vitreous degeneration, bilateral: Secondary | ICD-10-CM | POA: Diagnosis not present

## 2022-01-09 ENCOUNTER — Encounter (HOSPITAL_BASED_OUTPATIENT_CLINIC_OR_DEPARTMENT_OTHER): Payer: Self-pay | Admitting: Urology

## 2022-01-09 ENCOUNTER — Emergency Department (HOSPITAL_BASED_OUTPATIENT_CLINIC_OR_DEPARTMENT_OTHER): Payer: BC Managed Care – PPO

## 2022-01-09 DIAGNOSIS — R079 Chest pain, unspecified: Secondary | ICD-10-CM | POA: Diagnosis not present

## 2022-01-09 DIAGNOSIS — R110 Nausea: Secondary | ICD-10-CM | POA: Insufficient documentation

## 2022-01-09 DIAGNOSIS — Z5321 Procedure and treatment not carried out due to patient leaving prior to being seen by health care provider: Secondary | ICD-10-CM | POA: Diagnosis not present

## 2022-01-09 DIAGNOSIS — R42 Dizziness and giddiness: Secondary | ICD-10-CM | POA: Insufficient documentation

## 2022-01-09 DIAGNOSIS — R531 Weakness: Secondary | ICD-10-CM | POA: Insufficient documentation

## 2022-01-09 DIAGNOSIS — R231 Pallor: Secondary | ICD-10-CM | POA: Diagnosis not present

## 2022-01-09 LAB — CBC WITH DIFFERENTIAL/PLATELET
Abs Immature Granulocytes: 0.02 10*3/uL (ref 0.00–0.07)
Basophils Absolute: 0 10*3/uL (ref 0.0–0.1)
Basophils Relative: 0 %
Eosinophils Absolute: 0.1 10*3/uL (ref 0.0–0.5)
Eosinophils Relative: 2 %
HCT: 38.4 % (ref 36.0–46.0)
Hemoglobin: 13 g/dL (ref 12.0–15.0)
Immature Granulocytes: 0 %
Lymphocytes Relative: 37 %
Lymphs Abs: 2.4 10*3/uL (ref 0.7–4.0)
MCH: 30.7 pg (ref 26.0–34.0)
MCHC: 33.9 g/dL (ref 30.0–36.0)
MCV: 90.6 fL (ref 80.0–100.0)
Monocytes Absolute: 0.5 10*3/uL (ref 0.1–1.0)
Monocytes Relative: 8 %
Neutro Abs: 3.5 10*3/uL (ref 1.7–7.7)
Neutrophils Relative %: 53 %
Platelets: 255 10*3/uL (ref 150–400)
RBC: 4.24 MIL/uL (ref 3.87–5.11)
RDW: 11.8 % (ref 11.5–15.5)
WBC: 6.5 10*3/uL (ref 4.0–10.5)
nRBC: 0 % (ref 0.0–0.2)

## 2022-01-09 LAB — BASIC METABOLIC PANEL
Anion gap: 11 (ref 5–15)
BUN: 17 mg/dL (ref 6–20)
CO2: 27 mmol/L (ref 22–32)
Calcium: 9.8 mg/dL (ref 8.9–10.3)
Chloride: 101 mmol/L (ref 98–111)
Creatinine, Ser: 0.74 mg/dL (ref 0.44–1.00)
GFR, Estimated: >60 mL/min (ref >60)
Glucose, Bld: 112 mg/dL — ABNORMAL HIGH (ref 70–99)
Potassium: 3.6 mmol/L (ref 3.5–5.1)
Sodium: 139 mmol/L (ref 135–145)

## 2022-01-09 NOTE — ED Triage Notes (Signed)
Had an episode of dizziness and weakness States felt nauseated  Denies fall  Diaphoretic on scene  VS wnl with EMS  112/74  BS 114, NSR  Denies any pain or recent illness   Had retina repair this past Monday, still having floaters in vision

## 2022-01-10 ENCOUNTER — Emergency Department (HOSPITAL_BASED_OUTPATIENT_CLINIC_OR_DEPARTMENT_OTHER)
Admission: EM | Admit: 2022-01-10 | Discharge: 2022-01-10 | Discharge status: Against Medical Advice | Payer: BC Managed Care – PPO | Attending: Emergency Medicine | Admitting: Emergency Medicine

## 2022-01-10 ENCOUNTER — Telehealth: Payer: Self-pay | Admitting: Family Medicine

## 2022-01-10 LAB — TROPONIN I (HIGH SENSITIVITY): Troponin I (High Sensitivity): <2 ng/L (ref <18)

## 2022-01-10 NOTE — Telephone Encounter (Signed)
Pt went to er yesterday due to she almost passed out and per pt she left before seeing a provider. Pt called and was transferred to triage nurse and was sent back to office to sch an appt with in 24 hrs. I transferred the pt back to triage nurse and gizelle pick up the line and I told her the situation and I also mention I do not know  if pt told the nurse she left before seeing a provider and gizelle put me on hold and consulted with the nurse who took the call and gizelle will report to pt to go to ER or UC since office does not have in office appt tomorrow 01-11-2022

## 2022-01-10 NOTE — Telephone Encounter (Signed)
---  Caller states excessive sweating, tingling in hands and feet, felt like going to pass out. States seen in the ED last night, labs drawn and waited several hours and did not see a provider so left the ED. Wanting to schedule an appt. Retina repair on Monday additional floaters and seen yesterday. +fluids, +UOP in last 8 Hrs  01/10/2022 1:23:15 PM See PCP within 24 Hours Nunzio Cory, RN, [place]  Referrals REFERRED TO PCP OFFICE  01/10/22 1636 - Pt repeats above. States she used to get these episodes, "but it's been years". Pt states she did NOT lose consciousness during this episode that occurred last pm while she was standing outside talking to a neighbor. Pt states she feels fine right now & is currently at work. She was wanting her labs reviewed that were drawn at the ED. Appt scheduled with Dr Salomon Fick for tomorrow at 1:30PM.

## 2022-01-10 NOTE — Telephone Encounter (Signed)
FYI

## 2022-01-11 ENCOUNTER — Ambulatory Visit: Payer: BC Managed Care – PPO | Admitting: Family Medicine

## 2022-01-11 VITALS — BP 100/68 | HR 82 | Temp 98.2°F | Wt 131.0 lb

## 2022-01-11 DIAGNOSIS — R55 Syncope and collapse: Secondary | ICD-10-CM

## 2022-01-11 NOTE — Telephone Encounter (Signed)
I am sure Dr. Salomon Fick will go over these labs with her this afternoon

## 2022-01-11 NOTE — Progress Notes (Signed)
Subjective:    Patient ID: [name] [name], female    DOB: [birth year], 56 y.o.   MRN: [id number]  Chief Complaint  Patient presents with   OTHER    Pt reports she had an episode of fainting on Wednesday. added" Felt like something coming over" and sat down. Denied injury.     HPI Patient was seen today for f/u.  Pt endorses preyncopal feeling 2 days ago while standing in garage speaking with her neighbor.  Garage door open but it may have been warm inside. Patient sat down and then laid down on the floor due to the feeling.  Endorses nausea at the time.  Went to ED however left after long wait.  Patient unsure if she drank as much fluids the day she felt symptoms.  Endorses eating that day.  Earlier in the week patient contacted her ophthalmologist due to pressure in posterior neck, occipital area, and frontal area after laser eye surgery to repair retina on Monday 7/3.  Patient states she was told symptoms were likely 2/2 hemorrhage in the eye from surgery.  Patient endorses intermittent blurred vision and floaters.  Past Medical History:  Diagnosis Date   Allergic rhinitis    Anxiety disorder    Asthma    Breast thickening    Constipation    Depression    sees Emergency planning/management officer and Dr. Nolen Mu   GERD (gastroesophageal reflux disease)    Obesity    UTI (urinary tract infection)     Allergies  Allergen Reactions   Penicillins     REACTION: childhood - hives/rash Has patient had a PCN reaction causing immediate rash, facial/tongue/throat swelling, SOB or lightheadedness with hypotension: Yes Has patient had a PCN reaction causing severe rash involving mucus membranes or skin necrosis: Yes Has patient had a PCN reaction that required hospitalization No Has patient had a PCN reaction occurring within the last 10 years: No If all of the above answers are "NO", then may proceed with Cephalosporin use.    Sulfonamide Derivatives     REACTION: childhood - hives/rash     ROS General: Denies fever, chills, night sweats, changes in weight, changes in appetite + presyncope HEENT: Denies ear pain, rhinorrhea, sore throat + blurred vision, floaters, headaches CV: Denies CP, palpitations, SOB, orthopnea Pulm: Denies SOB, cough, wheezing GI: Denies abdominal pain, nausea, vomiting, diarrhea, constipation GU: Denies dysuria, hematuria, frequency, vaginal discharge Msk: Denies muscle cramps, joint pains Neuro: Denies weakness, numbness, tingling Skin: Denies rashes, bruising Psych: Denies depression, anxiety, hallucinations     Objective:    Blood pressure 100/68, pulse 82, temperature 98.2 F (36.8 C), temperature source Oral, weight 131 lb (59.4 kg), last menstrual period 08/03/2017, SpO2 97 %.  Gen. Pleasant, well-nourished, in no distress, normal affect   HEENT: [place]/AT, face symmetric, conjunctiva clear, no scleral icterus, PERRLA, EOMI, nares patent without drainage Neck: No JVD, no thyromegaly, no carotid bruits Lungs: no accessory muscle use, CTAB, no wheezes or rales Cardiovascular: RRR, no m/r/g, no peripheral edema Musculoskeletal: No deformities, no cyanosis or clubbing, normal tone Neuro:  A&Ox3, CN II-XII intact, normal gait Skin:  Warm, no lesions/ rash  BP Readings from Last 3 Encounters:  01/11/22 100/68  01/09/22 124/83  08/03/21 118/80     Wt Readings from Last 3 Encounters:  01/11/22 131 lb (59.4 kg)  01/09/22 126 lb 1.7 oz (57.2 kg)  08/03/21 126 lb (57.2 kg)    Lab Results  Component Value Date   WBC 6.5 01/09/2022  HGB 13.0 01/09/2022   HCT 38.4 01/09/2022   PLT 255 01/09/2022   GLUCOSE 112 (H) 01/09/2022   CHOL 206 (H) 10/11/2020   TRIG 63.0 10/11/2020   HDL 71.50 10/11/2020   LDLDIRECT 154.2 12/26/2008   LDLCALC 122 (H) 10/11/2020   ALT 17 10/11/2020   AST 14 10/11/2020   NA 139 01/09/2022   K 3.6 01/09/2022   CL 101 01/09/2022   CREATININE 0.74 01/09/2022   BUN 17 01/09/2022   CO2 27 01/09/2022   TSH  3.65 10/11/2020    Assessment/Plan:  Near syncope -Labs (BMP, CBC) from ED reviewed. -Discussed possible causes of symptoms including dehydration, prolonged standing, becoming hot as was standing in garage, arrhythmia -Given recent increased temperatures patient likely became overheated causing hypotension while standing in the garage -Discussed importance of hydration, eating regular meals -Given strict precautions. -For continued or worsening symptoms obtain labs including hemoglobin A1c, TSH, free T4, lipid panel, and possible imaging.  F/u as needed  Abbe Amsterdam, MD

## 2022-01-15 DIAGNOSIS — F411 Generalized anxiety disorder: Secondary | ICD-10-CM | POA: Diagnosis not present

## 2022-01-15 DIAGNOSIS — H43393 Other vitreous opacities, bilateral: Secondary | ICD-10-CM | POA: Diagnosis not present

## 2022-01-18 ENCOUNTER — Encounter: Payer: Self-pay | Admitting: Family Medicine

## 2022-01-22 DIAGNOSIS — L814 Other melanin hyperpigmentation: Secondary | ICD-10-CM | POA: Diagnosis not present

## 2022-01-22 DIAGNOSIS — L570 Actinic keratosis: Secondary | ICD-10-CM | POA: Diagnosis not present

## 2022-01-22 DIAGNOSIS — D1801 Hemangioma of skin and subcutaneous tissue: Secondary | ICD-10-CM | POA: Diagnosis not present

## 2022-01-22 DIAGNOSIS — F411 Generalized anxiety disorder: Secondary | ICD-10-CM | POA: Diagnosis not present

## 2022-01-29 DIAGNOSIS — F411 Generalized anxiety disorder: Secondary | ICD-10-CM | POA: Diagnosis not present

## 2022-02-05 DIAGNOSIS — F411 Generalized anxiety disorder: Secondary | ICD-10-CM | POA: Diagnosis not present

## 2022-02-19 DIAGNOSIS — F411 Generalized anxiety disorder: Secondary | ICD-10-CM | POA: Diagnosis not present

## 2022-02-26 DIAGNOSIS — F411 Generalized anxiety disorder: Secondary | ICD-10-CM | POA: Diagnosis not present

## 2022-03-05 DIAGNOSIS — F411 Generalized anxiety disorder: Secondary | ICD-10-CM | POA: Diagnosis not present

## 2022-03-11 DIAGNOSIS — R1084 Generalized abdominal pain: Secondary | ICD-10-CM | POA: Diagnosis not present

## 2022-03-11 DIAGNOSIS — N390 Urinary tract infection, site not specified: Secondary | ICD-10-CM | POA: Diagnosis not present

## 2022-03-11 DIAGNOSIS — K5900 Constipation, unspecified: Secondary | ICD-10-CM | POA: Diagnosis not present

## 2022-03-11 DIAGNOSIS — D72829 Elevated white blood cell count, unspecified: Secondary | ICD-10-CM | POA: Diagnosis not present

## 2022-03-12 DIAGNOSIS — F411 Generalized anxiety disorder: Secondary | ICD-10-CM | POA: Diagnosis not present

## 2022-03-19 DIAGNOSIS — F411 Generalized anxiety disorder: Secondary | ICD-10-CM | POA: Diagnosis not present

## 2022-03-20 DIAGNOSIS — H43813 Vitreous degeneration, bilateral: Secondary | ICD-10-CM | POA: Diagnosis not present

## 2022-03-20 DIAGNOSIS — H31092 Other chorioretinal scars, left eye: Secondary | ICD-10-CM | POA: Diagnosis not present

## 2022-03-20 DIAGNOSIS — H2513 Age-related nuclear cataract, bilateral: Secondary | ICD-10-CM | POA: Diagnosis not present

## 2022-03-20 DIAGNOSIS — H43393 Other vitreous opacities, bilateral: Secondary | ICD-10-CM | POA: Diagnosis not present

## 2022-03-26 DIAGNOSIS — F411 Generalized anxiety disorder: Secondary | ICD-10-CM | POA: Diagnosis not present

## 2022-04-12 DIAGNOSIS — F411 Generalized anxiety disorder: Secondary | ICD-10-CM | POA: Diagnosis not present

## 2022-04-16 DIAGNOSIS — F411 Generalized anxiety disorder: Secondary | ICD-10-CM | POA: Diagnosis not present

## 2022-04-23 DIAGNOSIS — F411 Generalized anxiety disorder: Secondary | ICD-10-CM | POA: Diagnosis not present

## 2022-04-30 DIAGNOSIS — F411 Generalized anxiety disorder: Secondary | ICD-10-CM | POA: Diagnosis not present

## 2022-05-06 DIAGNOSIS — F411 Generalized anxiety disorder: Secondary | ICD-10-CM | POA: Diagnosis not present

## 2022-05-10 ENCOUNTER — Ambulatory Visit: Payer: BC Managed Care – PPO | Admitting: Family Medicine

## 2022-05-10 ENCOUNTER — Encounter: Payer: Self-pay | Admitting: Family Medicine

## 2022-05-10 VITALS — BP 110/80 | HR 87 | Temp 97.9°F | Wt 126.5 lb

## 2022-05-10 DIAGNOSIS — T148XXA Other injury of unspecified body region, initial encounter: Secondary | ICD-10-CM | POA: Diagnosis not present

## 2022-05-10 NOTE — Progress Notes (Signed)
   Subjective:    Patient ID: [name] [name], female    DOB: 03/30/1966, 56 y.o.   MRN: [id number]  HPI Here to check a tender lump on the left lower leg that has been present for 2 months. It came up after a wooden gate fell on her leg. It was black and blue at first, but now is a dull pink. It is much less tender than it used to be. It has been slowly getting smaller.    Review of Systems  Constitutional: Negative.   Respiratory: Negative.    Cardiovascular: Negative.        Objective:   Physical Exam Constitutional:      Appearance: Normal appearance.  Cardiovascular:     Rate and Rhythm: Normal rate and regular rhythm.     Pulses: Normal pulses.     Heart sounds: Normal heart sounds.  Pulmonary:     Effort: Pulmonary effort is normal.     Breath sounds: Normal breath sounds.  Musculoskeletal:     Comments: The posterior left lower leg has a 3 cm hematoma which is firm and slightly tender. The skin above it is a dull pink in color  Neurological:     Mental Status: She is alert.           Assessment & Plan:  This is a simple hematoma. I reassured her that this will slowly resolve over the next few months.  Alysia Penna, MD

## 2022-05-17 DIAGNOSIS — H25043 Posterior subcapsular polar age-related cataract, bilateral: Secondary | ICD-10-CM | POA: Diagnosis not present

## 2022-05-17 DIAGNOSIS — H2513 Age-related nuclear cataract, bilateral: Secondary | ICD-10-CM | POA: Diagnosis not present

## 2022-05-17 DIAGNOSIS — H18413 Arcus senilis, bilateral: Secondary | ICD-10-CM | POA: Diagnosis not present

## 2022-05-17 DIAGNOSIS — H25013 Cortical age-related cataract, bilateral: Secondary | ICD-10-CM | POA: Diagnosis not present

## 2022-05-17 DIAGNOSIS — H2511 Age-related nuclear cataract, right eye: Secondary | ICD-10-CM | POA: Diagnosis not present

## 2022-05-21 DIAGNOSIS — F411 Generalized anxiety disorder: Secondary | ICD-10-CM | POA: Diagnosis not present

## 2022-06-04 DIAGNOSIS — F411 Generalized anxiety disorder: Secondary | ICD-10-CM | POA: Diagnosis not present

## 2022-06-11 DIAGNOSIS — F411 Generalized anxiety disorder: Secondary | ICD-10-CM | POA: Diagnosis not present

## 2022-06-20 DIAGNOSIS — F411 Generalized anxiety disorder: Secondary | ICD-10-CM | POA: Diagnosis not present

## 2022-06-25 DIAGNOSIS — F411 Generalized anxiety disorder: Secondary | ICD-10-CM | POA: Diagnosis not present

## 2022-07-16 DIAGNOSIS — F411 Generalized anxiety disorder: Secondary | ICD-10-CM | POA: Diagnosis not present

## 2022-07-23 DIAGNOSIS — F411 Generalized anxiety disorder: Secondary | ICD-10-CM | POA: Diagnosis not present

## 2022-07-30 DIAGNOSIS — F411 Generalized anxiety disorder: Secondary | ICD-10-CM | POA: Diagnosis not present

## 2022-08-06 DIAGNOSIS — F411 Generalized anxiety disorder: Secondary | ICD-10-CM | POA: Diagnosis not present

## 2022-08-13 DIAGNOSIS — F411 Generalized anxiety disorder: Secondary | ICD-10-CM | POA: Diagnosis not present

## 2022-08-20 DIAGNOSIS — F411 Generalized anxiety disorder: Secondary | ICD-10-CM | POA: Diagnosis not present

## 2022-09-02 DIAGNOSIS — H43391 Other vitreous opacities, right eye: Secondary | ICD-10-CM | POA: Diagnosis not present

## 2022-09-02 DIAGNOSIS — H2511 Age-related nuclear cataract, right eye: Secondary | ICD-10-CM | POA: Diagnosis not present

## 2022-09-03 DIAGNOSIS — H43391 Other vitreous opacities, right eye: Secondary | ICD-10-CM | POA: Diagnosis not present

## 2022-09-03 DIAGNOSIS — Z9889 Other specified postprocedural states: Secondary | ICD-10-CM | POA: Diagnosis not present

## 2022-09-03 DIAGNOSIS — H2511 Age-related nuclear cataract, right eye: Secondary | ICD-10-CM | POA: Diagnosis not present

## 2022-09-05 DIAGNOSIS — H2511 Age-related nuclear cataract, right eye: Secondary | ICD-10-CM | POA: Diagnosis not present

## 2022-09-05 DIAGNOSIS — H31401 Unspecified choroidal detachment, right eye: Secondary | ICD-10-CM | POA: Diagnosis not present

## 2022-09-05 DIAGNOSIS — H43391 Other vitreous opacities, right eye: Secondary | ICD-10-CM | POA: Diagnosis not present

## 2022-09-05 DIAGNOSIS — Z9889 Other specified postprocedural states: Secondary | ICD-10-CM | POA: Diagnosis not present

## 2022-09-09 DIAGNOSIS — F411 Generalized anxiety disorder: Secondary | ICD-10-CM | POA: Diagnosis not present

## 2022-09-12 DIAGNOSIS — F411 Generalized anxiety disorder: Secondary | ICD-10-CM | POA: Diagnosis not present

## 2022-09-15 ENCOUNTER — Other Ambulatory Visit: Payer: Self-pay | Admitting: Family Medicine

## 2022-09-16 ENCOUNTER — Other Ambulatory Visit: Payer: Self-pay | Admitting: Family Medicine

## 2022-09-24 DIAGNOSIS — H2512 Age-related nuclear cataract, left eye: Secondary | ICD-10-CM | POA: Diagnosis not present

## 2022-09-30 DIAGNOSIS — H43392 Other vitreous opacities, left eye: Secondary | ICD-10-CM | POA: Diagnosis not present

## 2022-09-30 DIAGNOSIS — H33332 Multiple defects of retina without detachment, left eye: Secondary | ICD-10-CM | POA: Diagnosis not present

## 2022-09-30 DIAGNOSIS — H2512 Age-related nuclear cataract, left eye: Secondary | ICD-10-CM | POA: Diagnosis not present

## 2022-10-01 DIAGNOSIS — H43392 Other vitreous opacities, left eye: Secondary | ICD-10-CM | POA: Diagnosis not present

## 2022-10-01 DIAGNOSIS — F411 Generalized anxiety disorder: Secondary | ICD-10-CM | POA: Diagnosis not present

## 2022-10-01 DIAGNOSIS — H2512 Age-related nuclear cataract, left eye: Secondary | ICD-10-CM | POA: Diagnosis not present

## 2022-10-01 DIAGNOSIS — Z9889 Other specified postprocedural states: Secondary | ICD-10-CM | POA: Diagnosis not present

## 2022-10-08 DIAGNOSIS — H2512 Age-related nuclear cataract, left eye: Secondary | ICD-10-CM | POA: Diagnosis not present

## 2022-10-08 DIAGNOSIS — H43392 Other vitreous opacities, left eye: Secondary | ICD-10-CM | POA: Diagnosis not present

## 2022-10-08 DIAGNOSIS — Z9889 Other specified postprocedural states: Secondary | ICD-10-CM | POA: Diagnosis not present

## 2022-10-08 DIAGNOSIS — F411 Generalized anxiety disorder: Secondary | ICD-10-CM | POA: Diagnosis not present

## 2022-10-10 ENCOUNTER — Other Ambulatory Visit: Payer: Self-pay | Admitting: Family Medicine

## 2022-10-10 NOTE — Telephone Encounter (Signed)
Pt LOV was on 05/10/22 Last refill for Valium was last done on 07/20/2021 Please advise

## 2022-10-17 DIAGNOSIS — J209 Acute bronchitis, unspecified: Secondary | ICD-10-CM | POA: Diagnosis not present

## 2022-10-17 DIAGNOSIS — J45998 Other asthma: Secondary | ICD-10-CM | POA: Diagnosis not present

## 2022-10-18 ENCOUNTER — Telehealth: Payer: BC Managed Care – PPO | Admitting: Family Medicine

## 2022-10-28 DIAGNOSIS — F411 Generalized anxiety disorder: Secondary | ICD-10-CM | POA: Diagnosis not present

## 2022-11-01 DIAGNOSIS — Z9889 Other specified postprocedural states: Secondary | ICD-10-CM | POA: Diagnosis not present

## 2022-11-01 DIAGNOSIS — H2512 Age-related nuclear cataract, left eye: Secondary | ICD-10-CM | POA: Diagnosis not present

## 2022-11-01 DIAGNOSIS — H43392 Other vitreous opacities, left eye: Secondary | ICD-10-CM | POA: Diagnosis not present

## 2022-11-11 DIAGNOSIS — F411 Generalized anxiety disorder: Secondary | ICD-10-CM | POA: Diagnosis not present

## 2022-11-18 DIAGNOSIS — F411 Generalized anxiety disorder: Secondary | ICD-10-CM | POA: Diagnosis not present

## 2022-11-22 ENCOUNTER — Encounter: Payer: Self-pay | Admitting: Family Medicine

## 2022-11-22 ENCOUNTER — Telehealth (INDEPENDENT_AMBULATORY_CARE_PROVIDER_SITE_OTHER): Payer: BC Managed Care – PPO | Admitting: Family Medicine

## 2022-11-22 DIAGNOSIS — J040 Acute laryngitis: Secondary | ICD-10-CM

## 2022-11-22 DIAGNOSIS — R5381 Other malaise: Secondary | ICD-10-CM

## 2022-11-22 DIAGNOSIS — J4520 Mild intermittent asthma, uncomplicated: Secondary | ICD-10-CM

## 2022-11-22 DIAGNOSIS — R0982 Postnasal drip: Secondary | ICD-10-CM | POA: Diagnosis not present

## 2022-11-22 MED ORDER — PREDNISONE 20 MG PO TABS: 40.0000 mg | ORAL_TABLET | Freq: Every day | ORAL | 0 refills | Status: AC

## 2022-11-22 MED ORDER — PREDNISONE 10 MG PO TABS: ORAL_TABLET | ORAL | 0 refills | Status: DC

## 2022-11-22 NOTE — Progress Notes (Signed)
Virtual Visit via Video Note  I connected with [name] [name] on 11/22/22 at  1:00 PM EDT by a video enabled telemedicine application and verified that I am speaking with the correct person using two identifiers.  Location patient: home Location provider:work or home office Persons participating in the virtual visit: patient, provider  I discussed the limitations of evaluation and management by telemedicine and the availability of in person appointments. The patient expressed understanding and agreed to proceed.  Chief Complaint  Patient presents with   chest congestion    In throat and upper chest. Is going out of town by plane and wants to take preventative measures. Just started yesterday,     HPI:  Pt is a 57 year old female with pmh sig for asthma, allergies, history of anxiety and depression followed by Dr. Clent Ridges and seen for acute concern. Congestion in back of throat and hoarseness x 1 d.  Pt also with malaise. Flying to [place] tomorrow for 3 days for a concert.  Afraid asthma will flare if she develops chest congestion. Has started using albuterol inhaler for mild chest tightness Denies fever, chills, SOB, HA, n/v, ear pain/pressure, rhinorrhea, facial pain/pressure, cough. Has some issues with seasonal allergies.  Not currently taking antihistamine.  Tried Mucinex and emergen-c.  ROS: See pertinent positives and negatives per HPI.  Past Medical History:  Diagnosis Date   Allergic rhinitis    Anxiety disorder    Asthma    Breast thickening    Constipation    Depression    sees [employer] and Dr. Nolen Mu   GERD (gastroesophageal reflux disease)    Obesity    UTI (urinary tract infection)     Past Surgical History:  Procedure Laterality Date   CESAREAN SECTION  2001   CESAREAN SECTION  2003   CHOLECYSTECTOMY     NISSEN FUNDOPLICATION  07/2005   Dr. Daphine Deutscher   TONSILLECTOMY     TUBAL LIGATION     VENTRAL HERNIA REPAIR  1/09   Dr. Daphine Deutscher    Family History   Problem Relation Age of Onset   Alcohol abuse Other    Arthritis Other    Breast cancer Other        maternal  aunt   Coronary artery disease Other    Hypertension Other    Rheum arthritis Mother    Breast cancer Mother    Diverticulitis Mother    Crohn's disease Sister    Anemia Sister    Heart attack Paternal Grandfather    Breast cancer Maternal Grandmother    Hypertension Maternal Grandmother    COPD Father    Colon cancer Neg Hx    Esophageal cancer Neg Hx    Rectal cancer Neg Hx    Stomach cancer Neg Hx     Current Outpatient Medications:    albuterol (VENTOLIN HFA) 108 (90 Base) MCG/ACT inhaler, Inhale 2 puffs into the lungs every 4 (four) hours as needed for wheezing or shortness of breath., Disp: 54 g, Rfl: 0   cetirizine-pseudoephedrine (ZYRTEC-D) 5-120 MG per tablet, Take 1 tablet by mouth daily., Disp: , Rfl:    diazepam (VALIUM) 5 MG tablet, TAKE 1 TABLET BY MOUTH EVERY 8 HOURS AS NEEDED FOR ANXIETY., Disp: 90 tablet, Rfl: 5   pantoprazole (PROTONIX) 40 MG tablet, TAKE 1 TABLET BY MOUTH EVERY DAY, Disp: 90 tablet, Rfl: 3   SYMBICORT 160-4.5 MCG/ACT inhaler, INHALE 2 PUFFS INTO THE LUNGS TWICE A DAY, Disp: 30.6 each, Rfl: 3  EXAM:  VITALS per patient if applicable: RR between 12-20 bpm  GENERAL: alert, oriented, appears well and in no acute distress.  Hoarse voice  HEENT: atraumatic, conjunctiva clear, no obvious abnormalities on inspection of external nose and ears  NECK: normal movements of the head and neck  LUNGS: on inspection no signs of respiratory distress, breathing rate appears normal, no obvious gross SOB, gasping or wheezing  CV: no obvious cyanosis  MS: moves all visible extremities without noticeable abnormality  PSYCH/NEURO: pleasant and cooperative, no obvious depression or anxiety, speech and thought processing grossly intact  ASSESSMENT AND PLAN:  Discussed the following assessment and plan:  Laryngitis  -new problem - Plan:  predniSONE (DELTASONE) 20 MG tablet, DISCONTINUED: predniSONE (DELTASONE) 10 MG tablet  Post-nasal drainage -Antihistamine  Malaise  Mild intermittent asthma without complication  -continue albuterol inhaler as needed and Symbicort 2 puffs twice daily - Plan: predniSONE (DELTASONE) 20 MG tablet  Advised acute symptoms likely 2/2 viral etiology versus seasonal allergies causing postnasal drainage.  Discussed using OTC antihistamine such as Zyrtec, Allegra.  Also advised to use Flonase or other nasal spray.  Given wait-and-see Rx for prednisone burst if develops increased wheezing or worsening laryngitis.  Vocal rest.  Other supportive care including OTC cough/cold medication, rest, vitamins, fluids, etc.  Given strict precautions.    I discussed the assessment and treatment plan with the patient. The patient was provided an opportunity to ask questions and all were answered. The patient agreed with the plan and demonstrated an understanding of the instructions.   The patient was advised to call back or seek an in-person evaluation if the symptoms worsen or if the condition fails to improve as anticipated.   [name] Saint, MD

## 2022-12-01 DIAGNOSIS — M79672 Pain in left foot: Secondary | ICD-10-CM | POA: Diagnosis not present

## 2022-12-01 DIAGNOSIS — S8002XA Contusion of left knee, initial encounter: Secondary | ICD-10-CM | POA: Diagnosis not present

## 2022-12-01 DIAGNOSIS — W010XXA Fall on same level from slipping, tripping and stumbling without subsequent striking against object, initial encounter: Secondary | ICD-10-CM | POA: Diagnosis not present

## 2022-12-01 DIAGNOSIS — S8992XA Unspecified injury of left lower leg, initial encounter: Secondary | ICD-10-CM | POA: Diagnosis not present

## 2022-12-01 DIAGNOSIS — Z882 Allergy status to sulfonamides status: Secondary | ICD-10-CM | POA: Diagnosis not present

## 2022-12-01 DIAGNOSIS — J45909 Unspecified asthma, uncomplicated: Secondary | ICD-10-CM | POA: Diagnosis not present

## 2022-12-01 DIAGNOSIS — R2242 Localized swelling, mass and lump, left lower limb: Secondary | ICD-10-CM | POA: Diagnosis not present

## 2022-12-01 DIAGNOSIS — Z880 Allergy status to penicillin: Secondary | ICD-10-CM | POA: Diagnosis not present

## 2022-12-03 DIAGNOSIS — F411 Generalized anxiety disorder: Secondary | ICD-10-CM | POA: Diagnosis not present

## 2022-12-09 DIAGNOSIS — F411 Generalized anxiety disorder: Secondary | ICD-10-CM | POA: Diagnosis not present

## 2022-12-16 DIAGNOSIS — F411 Generalized anxiety disorder: Secondary | ICD-10-CM | POA: Diagnosis not present

## 2022-12-23 DIAGNOSIS — F411 Generalized anxiety disorder: Secondary | ICD-10-CM | POA: Diagnosis not present

## 2022-12-30 DIAGNOSIS — F411 Generalized anxiety disorder: Secondary | ICD-10-CM | POA: Diagnosis not present

## 2022-12-31 ENCOUNTER — Encounter: Payer: Self-pay | Admitting: Family Medicine

## 2022-12-31 ENCOUNTER — Ambulatory Visit: Payer: BC Managed Care – PPO | Admitting: Family Medicine

## 2022-12-31 VITALS — BP 110/74 | HR 88 | Temp 98.2°F | Wt 139.4 lb

## 2022-12-31 DIAGNOSIS — M79672 Pain in left foot: Secondary | ICD-10-CM | POA: Diagnosis not present

## 2022-12-31 DIAGNOSIS — M25562 Pain in left knee: Secondary | ICD-10-CM

## 2022-12-31 DIAGNOSIS — G8929 Other chronic pain: Secondary | ICD-10-CM

## 2022-12-31 DIAGNOSIS — M7989 Other specified soft tissue disorders: Secondary | ICD-10-CM

## 2022-12-31 NOTE — Progress Notes (Signed)
   Subjective:    Patient ID: [name] [name], female    DOB: 1966/04/03, 57 y.o.   MRN: [id number]  HPI Here for several issues. First she has had swelling in the left leg for several months. No chest pain or SOB. She also describes swelling and pain in the left forefoot. No hx of trauma. Then on 12-01-22 she stripped over something at home and fell into a dog gate, striking her left knee. She was seen at the [facility] ED in [place], and Xrays were negative for fractures. She was given Naproxen and sent home. She has continued to have pain in the knee since then .   Review of Systems  Constitutional: Negative.   Respiratory: Negative.    Cardiovascular:  Positive for leg swelling. Negative for chest pain and palpitations.  Musculoskeletal:  Positive for arthralgias.       Objective:   Physical Exam Constitutional:      General: She is not in acute distress.    Comments: Walks normally   Cardiovascular:     Rate and Rhythm: Normal rate and regular rhythm.     Pulses: Normal pulses.     Heart sounds: Normal heart sounds.  Pulmonary:     Effort: Pulmonary effort is normal.     Breath sounds: Normal breath sounds.  Musculoskeletal:     Comments: There is mild generalized swelling in the left lower leg. She is mildly tender throughout the left calf. The left knee appears normal, but she is very tender in both joint spaces. ROM is full. She is also tender between the left 1st and 2nd metatarsals. No erythema or warmth.   Neurological:     Mental Status: She is alert.           Assessment & Plan:  She has several different issues here. We will set up a venous doppler of the left leg to rule out any DVT. She likely has cartilage damage in the left knee from the fall, and she may have a Morton's neuroma in the left foot. We will refer her to Orthopedics to evaluate these issues. We spent a total of (34   ) minutes reviewing records and discussing these issues.  Gershon Crane, MD

## 2023-01-06 DIAGNOSIS — F411 Generalized anxiety disorder: Secondary | ICD-10-CM | POA: Diagnosis not present

## 2023-01-07 ENCOUNTER — Ambulatory Visit (HOSPITAL_COMMUNITY)
Admission: RE | Admit: 2023-01-07 | Discharge: 2023-01-07 | Disposition: A | Discharge status: Home / Self Care | Payer: BC Managed Care – PPO | Source: Ambulatory Visit | Attending: Family Medicine | Admitting: Family Medicine

## 2023-01-07 ENCOUNTER — Telehealth: Payer: Self-pay | Admitting: Family Medicine

## 2023-01-07 DIAGNOSIS — M7989 Other specified soft tissue disorders: Secondary | ICD-10-CM | POA: Insufficient documentation

## 2023-01-07 NOTE — Telephone Encounter (Signed)
Negative for DVT on left

## 2023-01-08 NOTE — Telephone Encounter (Signed)
Pt.notified

## 2023-01-13 DIAGNOSIS — F411 Generalized anxiety disorder: Secondary | ICD-10-CM | POA: Diagnosis not present

## 2023-01-22 DIAGNOSIS — M25562 Pain in left knee: Secondary | ICD-10-CM | POA: Diagnosis not present

## 2023-01-22 DIAGNOSIS — M79672 Pain in left foot: Secondary | ICD-10-CM | POA: Diagnosis not present

## 2023-01-27 DIAGNOSIS — F411 Generalized anxiety disorder: Secondary | ICD-10-CM | POA: Diagnosis not present

## 2023-02-03 DIAGNOSIS — F411 Generalized anxiety disorder: Secondary | ICD-10-CM | POA: Diagnosis not present

## 2023-02-04 DIAGNOSIS — Z1231 Encounter for screening mammogram for malignant neoplasm of breast: Secondary | ICD-10-CM | POA: Diagnosis not present

## 2023-02-04 LAB — HM MAMMOGRAPHY

## 2023-02-10 DIAGNOSIS — F411 Generalized anxiety disorder: Secondary | ICD-10-CM | POA: Diagnosis not present

## 2023-02-24 DIAGNOSIS — F411 Generalized anxiety disorder: Secondary | ICD-10-CM | POA: Diagnosis not present

## 2023-03-03 DIAGNOSIS — F411 Generalized anxiety disorder: Secondary | ICD-10-CM | POA: Diagnosis not present

## 2023-03-17 DIAGNOSIS — F411 Generalized anxiety disorder: Secondary | ICD-10-CM | POA: Diagnosis not present

## 2023-03-24 DIAGNOSIS — F411 Generalized anxiety disorder: Secondary | ICD-10-CM | POA: Diagnosis not present

## 2023-03-31 DIAGNOSIS — F411 Generalized anxiety disorder: Secondary | ICD-10-CM | POA: Diagnosis not present

## 2023-04-14 DIAGNOSIS — F411 Generalized anxiety disorder: Secondary | ICD-10-CM | POA: Diagnosis not present

## 2023-04-21 DIAGNOSIS — F411 Generalized anxiety disorder: Secondary | ICD-10-CM | POA: Diagnosis not present

## 2023-05-05 DIAGNOSIS — F411 Generalized anxiety disorder: Secondary | ICD-10-CM | POA: Diagnosis not present

## 2023-05-12 DIAGNOSIS — F411 Generalized anxiety disorder: Secondary | ICD-10-CM | POA: Diagnosis not present

## 2023-05-14 DIAGNOSIS — Z1331 Encounter for screening for depression: Secondary | ICD-10-CM | POA: Diagnosis not present

## 2023-05-14 DIAGNOSIS — Z01419 Encounter for gynecological examination (general) (routine) without abnormal findings: Secondary | ICD-10-CM | POA: Diagnosis not present

## 2023-05-26 DIAGNOSIS — F411 Generalized anxiety disorder: Secondary | ICD-10-CM | POA: Diagnosis not present

## 2023-06-09 DIAGNOSIS — F411 Generalized anxiety disorder: Secondary | ICD-10-CM | POA: Diagnosis not present

## 2023-06-16 DIAGNOSIS — F411 Generalized anxiety disorder: Secondary | ICD-10-CM | POA: Diagnosis not present

## 2023-06-23 ENCOUNTER — Encounter: Payer: Self-pay | Admitting: Family Medicine

## 2023-06-23 MED ORDER — DIAZEPAM 5 MG PO TABS: 5.0000 mg | ORAL_TABLET | Freq: Three times a day (TID) | ORAL | 5 refills | Status: AC | PRN

## 2023-06-23 NOTE — Telephone Encounter (Signed)
Pt LOV was 12/31/22 Please advise

## 2023-06-23 NOTE — Addendum Note (Signed)
Addended by: Gershon Crane A on: 06/23/2023 03:05 PM   Modules accepted: Orders

## 2023-06-23 NOTE — Telephone Encounter (Signed)
Done

## 2023-07-14 DIAGNOSIS — F411 Generalized anxiety disorder: Secondary | ICD-10-CM | POA: Diagnosis not present

## 2023-07-21 ENCOUNTER — Encounter: Payer: BC Managed Care – PPO | Admitting: Genetic Counselor

## 2023-07-21 ENCOUNTER — Other Ambulatory Visit: Payer: BC Managed Care – PPO

## 2023-08-04 DIAGNOSIS — F411 Generalized anxiety disorder: Secondary | ICD-10-CM | POA: Diagnosis not present

## 2023-08-11 DIAGNOSIS — F411 Generalized anxiety disorder: Secondary | ICD-10-CM | POA: Diagnosis not present

## 2023-08-18 DIAGNOSIS — F411 Generalized anxiety disorder: Secondary | ICD-10-CM | POA: Diagnosis not present

## 2023-09-05 ENCOUNTER — Encounter: Payer: BC Managed Care – PPO | Admitting: Family Medicine

## 2023-09-08 DIAGNOSIS — F411 Generalized anxiety disorder: Secondary | ICD-10-CM | POA: Diagnosis not present

## 2023-09-15 DIAGNOSIS — F411 Generalized anxiety disorder: Secondary | ICD-10-CM | POA: Diagnosis not present

## 2023-09-29 DIAGNOSIS — F411 Generalized anxiety disorder: Secondary | ICD-10-CM | POA: Diagnosis not present

## 2023-10-06 DIAGNOSIS — F411 Generalized anxiety disorder: Secondary | ICD-10-CM | POA: Diagnosis not present

## 2023-10-13 ENCOUNTER — Telehealth: Payer: Self-pay | Admitting: Genetic Counselor

## 2023-10-13 NOTE — Telephone Encounter (Signed)
 Patient rescheduled appointments. Patient requested a date in June. I rescheduled [name] [name]'s appointments per her request.

## 2023-10-15 ENCOUNTER — Encounter: Payer: BC Managed Care – PPO | Admitting: Genetic Counselor

## 2023-10-15 ENCOUNTER — Other Ambulatory Visit: Payer: BC Managed Care – PPO

## 2023-11-21 DIAGNOSIS — L814 Other melanin hyperpigmentation: Secondary | ICD-10-CM | POA: Diagnosis not present

## 2023-11-21 DIAGNOSIS — D1801 Hemangioma of skin and subcutaneous tissue: Secondary | ICD-10-CM | POA: Diagnosis not present

## 2023-12-03 DIAGNOSIS — K219 Gastro-esophageal reflux disease without esophagitis: Secondary | ICD-10-CM | POA: Diagnosis not present

## 2023-12-03 DIAGNOSIS — J4530 Mild persistent asthma, uncomplicated: Secondary | ICD-10-CM | POA: Diagnosis not present

## 2023-12-03 DIAGNOSIS — F419 Anxiety disorder, unspecified: Secondary | ICD-10-CM | POA: Diagnosis not present

## 2023-12-03 DIAGNOSIS — Z13220 Encounter for screening for lipoid disorders: Secondary | ICD-10-CM | POA: Diagnosis not present

## 2023-12-08 ENCOUNTER — Other Ambulatory Visit

## 2023-12-08 ENCOUNTER — Encounter: Admitting: Genetic Counselor

## 2023-12-12 DIAGNOSIS — K219 Gastro-esophageal reflux disease without esophagitis: Secondary | ICD-10-CM | POA: Diagnosis not present

## 2023-12-12 DIAGNOSIS — J4530 Mild persistent asthma, uncomplicated: Secondary | ICD-10-CM | POA: Diagnosis not present

## 2023-12-12 DIAGNOSIS — F419 Anxiety disorder, unspecified: Secondary | ICD-10-CM | POA: Diagnosis not present

## 2023-12-12 DIAGNOSIS — F32A Depression, unspecified: Secondary | ICD-10-CM | POA: Diagnosis not present

## 2023-12-12 DIAGNOSIS — Z13220 Encounter for screening for lipoid disorders: Secondary | ICD-10-CM | POA: Diagnosis not present

## 2023-12-12 DIAGNOSIS — Z131 Encounter for screening for diabetes mellitus: Secondary | ICD-10-CM | POA: Diagnosis not present

## 2023-12-26 ENCOUNTER — Inpatient Hospital Stay: Admitting: Genetic Counselor

## 2023-12-26 ENCOUNTER — Inpatient Hospital Stay

## 2023-12-29 DIAGNOSIS — F411 Generalized anxiety disorder: Secondary | ICD-10-CM | POA: Diagnosis not present

## 2024-01-30 DIAGNOSIS — R7989 Other specified abnormal findings of blood chemistry: Secondary | ICD-10-CM | POA: Diagnosis not present

## 2024-02-02 DIAGNOSIS — F411 Generalized anxiety disorder: Secondary | ICD-10-CM | POA: Diagnosis not present

## 2024-02-09 DIAGNOSIS — F411 Generalized anxiety disorder: Secondary | ICD-10-CM | POA: Diagnosis not present

## 2024-02-12 ENCOUNTER — Telehealth: Payer: Self-pay | Admitting: Obstetrics and Gynecology

## 2024-02-12 NOTE — Telephone Encounter (Signed)
 Good Morning, patient was emailed on 8/4 and left message of friendly reminder for genetic counseling appt 02/13/2024 130pm at the [facility]. She was called this morning and she informed she has a mammogram and sorry it just seems she can't seem to keep this appt. She states her mother had testing while she had cancer and genetic testing showed no risk of being hereditary or passed on. She has decided to cancel. She says she really doesn't feel she needs it. Her appts have been as follows: 07/21/2023 cancelled due to out of town; 10/15/2023 patient cancelled and reason specified; 12/08/23 patient was rescheduled due to genetic counselor scheduled with was leaving. 12/26/23 patient cancelled and no reason specified. She canceled for tomorrow 02/13/2024 Thank you

## 2024-02-13 ENCOUNTER — Inpatient Hospital Stay: Admitting: Genetic Counselor

## 2024-02-13 ENCOUNTER — Inpatient Hospital Stay

## 2024-02-23 DIAGNOSIS — F411 Generalized anxiety disorder: Secondary | ICD-10-CM | POA: Diagnosis not present

## 2024-02-24 DIAGNOSIS — R635 Abnormal weight gain: Secondary | ICD-10-CM | POA: Diagnosis not present

## 2024-02-24 DIAGNOSIS — Z1231 Encounter for screening mammogram for malignant neoplasm of breast: Secondary | ICD-10-CM | POA: Diagnosis not present

## 2024-02-24 DIAGNOSIS — Z1331 Encounter for screening for depression: Secondary | ICD-10-CM | POA: Diagnosis not present

## 2024-02-24 DIAGNOSIS — N644 Mastodynia: Secondary | ICD-10-CM | POA: Diagnosis not present

## 2024-02-24 DIAGNOSIS — Z803 Family history of malignant neoplasm of breast: Secondary | ICD-10-CM | POA: Diagnosis not present

## 2024-03-01 DIAGNOSIS — F411 Generalized anxiety disorder: Secondary | ICD-10-CM | POA: Diagnosis not present

## 2024-03-15 DIAGNOSIS — F411 Generalized anxiety disorder: Secondary | ICD-10-CM | POA: Diagnosis not present

## 2024-03-26 DIAGNOSIS — N644 Mastodynia: Secondary | ICD-10-CM | POA: Diagnosis not present

## 2024-03-26 DIAGNOSIS — R92323 Mammographic fibroglandular density, bilateral breasts: Secondary | ICD-10-CM | POA: Diagnosis not present

## 2024-05-07 DIAGNOSIS — K219 Gastro-esophageal reflux disease without esophagitis: Secondary | ICD-10-CM | POA: Diagnosis not present

## 2024-05-07 DIAGNOSIS — R1310 Dysphagia, unspecified: Secondary | ICD-10-CM | POA: Diagnosis not present

## 2024-05-31 DIAGNOSIS — F411 Generalized anxiety disorder: Secondary | ICD-10-CM | POA: Diagnosis not present

## 2024-06-01 DIAGNOSIS — J4521 Mild intermittent asthma with (acute) exacerbation: Secondary | ICD-10-CM | POA: Diagnosis not present

## 2024-06-10 DIAGNOSIS — Z0000 Encounter for general adult medical examination without abnormal findings: Secondary | ICD-10-CM | POA: Diagnosis not present

## 2024-06-10 DIAGNOSIS — J4530 Mild persistent asthma, uncomplicated: Secondary | ICD-10-CM | POA: Diagnosis not present

## 2024-06-10 DIAGNOSIS — K219 Gastro-esophageal reflux disease without esophagitis: Secondary | ICD-10-CM | POA: Diagnosis not present

## 2024-06-10 DIAGNOSIS — F32A Depression, unspecified: Secondary | ICD-10-CM | POA: Diagnosis not present

## 2024-06-10 DIAGNOSIS — F419 Anxiety disorder, unspecified: Secondary | ICD-10-CM | POA: Diagnosis not present

## 2024-06-16 DIAGNOSIS — J45901 Unspecified asthma with (acute) exacerbation: Secondary | ICD-10-CM | POA: Diagnosis not present

## 2024-06-21 DIAGNOSIS — J4531 Mild persistent asthma with (acute) exacerbation: Secondary | ICD-10-CM | POA: Diagnosis not present

## 2024-08-06 ENCOUNTER — Encounter: Payer: Self-pay | Admitting: Physician Assistant

## 2024-08-06 ENCOUNTER — Ambulatory Visit: Admitting: Physician Assistant

## 2024-08-06 DIAGNOSIS — R1319 Other dysphagia: Secondary | ICD-10-CM

## 2024-08-06 DIAGNOSIS — R1310 Dysphagia, unspecified: Secondary | ICD-10-CM | POA: Diagnosis not present

## 2024-08-06 DIAGNOSIS — K219 Gastro-esophageal reflux disease without esophagitis: Secondary | ICD-10-CM | POA: Diagnosis not present

## 2024-08-06 DIAGNOSIS — K5900 Constipation, unspecified: Secondary | ICD-10-CM | POA: Diagnosis not present

## 2024-08-06 DIAGNOSIS — K5904 Chronic idiopathic constipation: Secondary | ICD-10-CM

## 2024-08-06 DIAGNOSIS — K5730 Diverticulosis of large intestine without perforation or abscess without bleeding: Secondary | ICD-10-CM | POA: Diagnosis not present

## 2024-08-06 NOTE — Progress Notes (Signed)
 "    08/06/2024 [name] [name] [id number] 1966/06/08  Referring provider: Johnny Garnette LABOR, MD Primary GI doctor: Dr. San (Dr. Eda)  ASSESSMENT AND PLAN:  GERD with history of HH status post Nissan fundoplication 2007 presents with worsening dysphagia with meats, odynophagia, recent episodes with possible food impaction Status post cholecystectomy 2006 biliary dyskinesia History of gastroparesis 2005 04/16/2019 EGD for dysphagia and reflux unremarkable esophagus, fundoplication wrap appears slightly unwrapped, gastritis, normal duodenum PATH: Negative EOE negative H. pylori, metaplasia, negative celiac Increase stress with mom being sick, increase weight Has been back on pantoprazole  and changing diet  Possible loose/failed wrap, stricture, gastroparesis, IBS-C PLAN: -Schedule EGD with dilatation to evaluate for stenosis, tumor, erosive/infectious esophagititis, and EOE. I discussed risks of EGD with patient today, including risk of sedation, bleeding or perforation.  Patient provides understanding and gave verbal consent to proceed. - continue pantoprazole  - consider barium swallow -In the interim patient advised about swallowing precautions.  -Eat slowly, chew food well before swallowing.  -Drink liquids in between each bite to avoid food impaction. - ER precautions discussed with the patient  Constipation Takes smooth move tea Potential pelvic floor component PLAN: - Increase fiber/ water intake, decrease caffeine, increase activity level. -Will add on Miralax daily and Benefiber, consider linzess -possible component of pelvis floor dysfunction with history and symptoms, consider referral to pelvic PT.   CCS No family history of colon cancer, sister with Crohn's 04/16/2019 colonoscopy with Dr. Eda excellent bowel prep diverticulosis sigmoid descending ascending colon otherwise normal no specimens collected Recall 10 years  Diverticulosis Will call if any  symptoms. Add on fiber supplement, avoid NSAIDS, information given   Patient Care Team: [name] Garnette LABOR, MD as PCP - General (Family Medicine)  HISTORY OF PRESENT ILLNESS: 59 y.o. female with a past medical history listed below presents for evaluation of dysphagia.   Discussed the use of AI scribe software for clinical note transcription with the patient, who gave verbal consent to proceed.  History of Present Illness            She  reports that she has never smoked. She has never used smokeless tobacco. She reports that she does not drink alcohol and does not use drugs.  RELEVANT GI HISTORY, IMAGING AND LABS: Results          CBC    Component Value Date/Time   WBC 6.5 01/09/2022 2316   RBC 4.24 01/09/2022 2316   HGB 13.0 01/09/2022 2316   HCT 38.4 01/09/2022 2316   PLT 255 01/09/2022 2316   MCV 90.6 01/09/2022 2316   MCH 30.7 01/09/2022 2316   MCHC 33.9 01/09/2022 2316   RDW 11.8 01/09/2022 2316   LYMPHSABS 2.4 01/09/2022 2316   MONOABS 0.5 01/09/2022 2316   EOSABS 0.1 01/09/2022 2316   BASOSABS 0.0 01/09/2022 2316   No results for input(s): HGB in the last 8760 hours.  CMP     Component Value Date/Time   NA 139 01/09/2022 2316   K 3.6 01/09/2022 2316   CL 101 01/09/2022 2316   CO2 27 01/09/2022 2316   GLUCOSE 112 (H) 01/09/2022 2316   BUN 17 01/09/2022 2316   CREATININE 0.74 01/09/2022 2316   CALCIUM 9.8 01/09/2022 2316   PROT 6.4 10/11/2020 0714   ALBUMIN 4.2 10/11/2020 0714   AST 14 10/11/2020 0714   ALT 17 10/11/2020 0714   ALKPHOS 52 10/11/2020 0714   BILITOT 0.5 10/11/2020 0714   GFRNONAA >60 01/09/2022 2316  GFRAA >60 05/20/2016 0640      Latest Ref Rng & Units 10/11/2020    7:14 AM 02/12/2018    8:22 AM 07/19/2016    8:19 AM  Hepatic Function  Total Protein 6.0 - 8.3 g/dL 6.4  7.1  6.3   Albumin 3.5 - 5.2 g/dL 4.2  4.4  4.0   AST 0 - 37 U/L 14  12  17    ALT 0 - 35 U/L 17  12  22    Alk Phosphatase 39 - 117 U/L 52  69  56   Total  Bilirubin 0.2 - 1.2 mg/dL 0.5  1.0  0.6   Bilirubin, Direct 0.0 - 0.3 mg/dL 0.1  0.2  0.1       Current Medications:   Current Outpatient Medications (Respiratory):    albuterol  (VENTOLIN  HFA) 108 (90 Base) MCG/ACT inhaler, Inhale 2 puffs into the lungs every 4 (four) hours as needed for wheezing or shortness of breath.   cetirizine-pseudoephedrine (ZYRTEC-D) 5-120 MG per tablet, Take 1 tablet by mouth daily.   SYMBICORT  160-4.5 MCG/ACT inhaler, INHALE 2 PUFFS INTO THE LUNGS TWICE A DAY  Current Outpatient Medications (Other):    diazepam  (VALIUM ) 5 MG tablet, Take 1 tablet (5 mg total) by mouth every 8 (eight) hours as needed. for anxiety   pantoprazole  (PROTONIX ) 40 MG tablet, TAKE 1 TABLET BY MOUTH EVERY DAY  Medical History:  Past Medical History:  Diagnosis Date   Allergic rhinitis    Anxiety disorder    Asthma    Breast thickening    Constipation    Depression    sees [employer] and Dr. Blinda   GERD (gastroesophageal reflux disease)    Obesity    UTI (urinary tract infection)    Allergies: Allergies[1]   Surgical History:  She  has a past surgical history that includes Nissen fundoplication (07/2005); Ventral hernia repair (1/09); Cesarean section (2001); Cesarean section (2003); Cholecystectomy; Tonsillectomy; and Tubal ligation. Family History:  Her family history includes Alcohol abuse in an other family member; Anemia in her sister; Arthritis in an other family member; Breast cancer in her maternal grandmother, mother, and another family member; COPD in her father; Coronary artery disease in an other family member; Crohn's disease in her sister; Diverticulitis in her mother; Heart attack in her paternal grandfather; Hypertension in her maternal grandmother and another family member; Rheum arthritis in her mother.  REVIEW OF SYSTEMS  : All other systems reviewed and negative except where noted in the History of Present Illness.  PHYSICAL EXAM: LMP 08/03/2017   Physical Exam          [name] JONELLE Coombs, PA-C 7:40 AM      [1]  Allergies Allergen Reactions   Penicillins     REACTION: childhood - hives/rash Has patient had a PCN reaction causing immediate rash, facial/tongue/throat swelling, SOB or lightheadedness with hypotension: Yes Has patient had a PCN reaction causing severe rash involving mucus membranes or skin necrosis: Yes Has patient had a PCN reaction that required hospitalization No Has patient had a PCN reaction occurring within the last 10 years: No If all of the above answers are NO, then may proceed with Cephalosporin use.    Sulfonamide Derivatives     REACTION: childhood - hives/rash   "

## 2024-08-06 NOTE — Patient Instructions (Addendum)
 _______________________________________________________  If your blood pressure at your visit was 140/90 or greater, please contact your primary care physician to follow up on this.  _______________________________________________________  If you are age 57 or older, your body mass index should be between 23-30. Your Body mass index is 28.49 kg/m. If this is out of the aforementioned range listed, please consider follow up with your Primary Care Provider.  If you are age 75 or younger, your body mass index should be between 19-25. Your Body mass index is 28.49 kg/m. If this is out of the aformentioned range listed, please consider follow up with your Primary Care Provider.   ________________________________________________________  The [facility] providers would like to encourage you to use MYCHART to communicate with providers for non-urgent requests or questions.  Due to long hold times on the telephone, sending your provider a message by [facility] may be a faster and more efficient way to get a response.  Please allow 48 business hours for a response.  Please remember that this is for non-urgent requests.  _______________________________________________________  [facility] is using a team-based approach to care.  Your team is made up of your doctor and two to three APPS. Our APPS (Nurse Practitioners and Physician Assistants) work with your physician to ensure care continuity for you. They are fully qualified to address your health concerns and develop a treatment plan. They communicate directly with your gastroenterologist to care for you. Seeing the Advanced Practice Practitioners on your physician's team can help you by facilitating care more promptly, often allowing for earlier appointments, access to diagnostic testing, procedures, and other specialty referrals.   You have been scheduled for an endoscopy. Please follow written instructions given to you at your visit  today.  If you use inhalers (even only as needed), please bring them with you on the day of your procedure.  If you take any of the following medications, they will need to be adjusted prior to your procedure:   DO NOT TAKE 7 DAYS PRIOR TO TEST- Trulicity (dulaglutide) Ozempic, Wegovy (semaglutide) Mounjaro, Zepbound (tirzepatide) Bydureon Bcise (exanatide extended release)  DO NOT TAKE 1 DAY PRIOR TO YOUR TEST Rybelsus (semaglutide) Adlyxin (lixisenatide) Victoza (liraglutide) Byetta (exanatide) ___________________________________________________________________________  Due to recent changes in healthcare laws, you may see the results of your imaging and laboratory studies on MyChart before your provider has had a chance to review them.  We understand that in some cases there may be results that are confusing or concerning to you. Not all laboratory results come back in the same time frame and the provider may be waiting for multiple results in order to interpret others.  Please give us  48 hours in order for your provider to thoroughly review all the results before contacting the office for clarification of your results.   Gastroparesis Gastroparesis is a condition in which food takes longer than normal to empty from the stomach.  This condition is also known as delayed gastric emptying. It is usually a long-term (chronic) condition.  What are the signs or symptoms? Symptoms of this condition include: Feeling full after eating very little or a loss of appetite. Nausea, vomiting, or heartburn. Bloating of your abdomen. Inconsistent blood sugar (glucose) levels on blood tests. Unexplained weight loss. Acid from the stomach coming up into the esophagus (gastroesophageal reflux). Sudden tightening (spasm) of the stomach, which can be painful. Symptoms may come and go. Some people may not notice any symptoms.  What increases the risk? You are more likely to  develop this condition  if: You have certain disorders or diseases. These may include: An endocrine disorder. An eating disorder. Amyloidosis. Scleroderma. Parkinson's disease. Multiple sclerosis. Cancer or infection of the stomach or the vagus nerve. You have had surgery on your stomach or vagus nerve. You take certain medicines. You are female  Things you can do: Please do small frequent meals like 4-6 meals a day.  Eat and drink liquids at separate times.  Avoid high fiber foods, cook your vegetables, avoid high fat food.  Suggest spreading protein throughout the day (greek yogurt, glucerna, soft meat, milk, eggs) Choose soft foods that you can mash with a fork When you are more symptomatic, change to pureed foods foods and liquids.  Consider reading Living well with Gastroparesis by [facility] Check out this link to a diet online https://my.groupjournal.fr  Reflux Gourmet Rescue  It is an ALGINATE THERAPY which is the only intervention that works to safeguard the esophagus by creating a protective barrier that actually stops reflux from happening. -The general directions for use are as stated on the packaging: Take 1 teaspoon (5 ml), or more as needed or as directed by your physician, after meals and before bed. -These general directions address the most common times for reflux to occur, but our Rescue products may be taken anytime. Some individuals may take our product preemptively, when they know they will suffer from reflux, or as needed - when discomfort arises. (If taken around food, it should be consumed last.) -You do not have to take 1 teaspoon (5 ml) of the product. While one teaspoon (5ml) may be the perfect average amount to relieve reflux suffering in some, others may require more or less. You may adjust the amount of Mint Chocolate Rescue and Vanilla Caramel Rescue to the lowest amount necessary  to meet your individual needs to improve your quality of life. -You may dilute the product if it is too viscous for you to consume. Keep in mind, however, that the thickness of the product was formulated to provide optimal coating and protection of your throat and esophagus. Though diluting the product is possible, it may reduce the protective function and/or length of action. -This can be used in conjunction with reflux medications and lifestyle changes.  100% ALL-NATURAL  Paraben FREE, glycerin FREE, & potassium FREE  Made entirely from all-natural ingredients considered safe for children and during pregnancy  No known side effects  All-natural flavor Gluten FREE  Allergen FREE  Vegan  Can find more information here: nameseizer.co.nz  Toileting tips to help with your constipation - Drink at least 64-80 ounces of water/liquid per day. - Establish a time to try to move your bowels every day.  For many people, this is after a cup of coffee or after a meal such as breakfast. - Sit all of the way back on the toilet keeping your back fairly straight and while sitting up, try to rest the tops of your forearms on your upper thighs.   - Raising your feet with a step stool/squatty potty can be helpful to improve the angle that allows your stool to pass through the rectum. - Relax the rectum feeling it bulge toward the toilet water.  If you feel your rectum raising toward your body, you are contracting rather than relaxing. - Breathe in and slowly exhale. Belly breath by expanding your belly towards your belly button. Keep belly expanded as you gently direct pressure down and back to the anus.  A low pitched  GRRR sound can assist with increasing intra-abdominal pressure.  (Can also trying to blow on a pinwheel and make it move, this helps with the same belly breathing) - Repeat 3-4 times. If unsuccessful, contract the pelvic floor to restore normal tone and get off  the toilet.  Avoid excessive straining. - To reduce excessive wiping by teaching your anus to normally contract, place hands on outer aspect of knees and resist knee movement outward.  Hold 5-10 second then place hands just inside of knees and resist inward movement of knees.  Hold 5 seconds.  Repeat a few times each way.  Go to the ER if unable to pass gas, severe AB pain, unable to hold down food, any shortness of breath of chest pain.  Miralax is an osmotic laxative.  It only brings more water into the stool.  This is safe to take daily.  Can take up to 17 gram of miralax twice a day.  Mix with juice or coffee.  Start 1 capful for 3-4 days and reassess your response in 3-4 days.  You can increase and decrease the dose based on your response.  Remember, it can take up to 3-4 days to take effect OR for the effects to wear off.   I often pair this with benefiber in the morning to help assure the stool is not too loose. Consider linzess  FIBER SUPPLEMENT You can do metamucil or fibercon once or twice a day but if this causes gas/bloating please switch to Benefiber or Citracel.  Fiber is good for constipation/diarrhea/irritable bowel syndrome.  It can also help with weight loss and can help lower your bad cholesterol (LDL).  Please do 1 TBSP in the morning in water, coffee, or tea.  It can take up to a month before you can see a difference with your bowel movements.  It is cheapest from [employer], [employer], [employer].     Diverticulosis Diverticulosis is a condition that develops when small pouches (diverticula) form in the wall of the large intestine (colon). The colon is where water is absorbed and stool (feces) is formed. The pouches form when the inside layer of the colon pushes through weak spots in the outer layers of the colon. You may have a few pouches or many of them. The pouches usually do not cause problems unless they become inflamed or infected. When this happens, the condition is  called diverticulitis- this is left lower quadrant pain, diarrhea, fever, chills, nausea or vomiting.  If this occurs please call the office or go to the hospital. Sometimes these patches without inflammation can also have painless bleeding associated with them, if this happens please call the office or go to the hospital. Preventing constipation and increasing fiber can help reduce diverticula and prevent complications. Even if you feel you have a high-fiber diet, suggest getting on Benefiber or Cirtracel 2 times daily.    BOWEL PURGE:  I am recommending a bowel purge to aid in cleaning out your bowels.   OVER THE COUNTER SHOPPING GUIDE:  Purchase 1 (one) 119 GRAM bottle of Miralax Purchase 1 (one) box of 5 mg Dulcolax tablets Purchase 1 (one) 32 ounce bottle of Gatorade  STEPS:  In the morning, mix the entire bottle of Miralax in the 32 ounces of Gatorade, then refrigerate to dissolve completely. Take 4 dulcolax tablets, then wait 1 hour After 1 hour, drink the Miralax solution you have prepared over the next 2-3 hours. You should expect results within 1 to 6 hours  after completing this bowel purge. Go to the ER if you have severe AB pain, can not pass gas or stool in over 12 hours, can not hold down any food.  BOWEL PURGE:  This will aid in cleaning out your bowels    Here some information about pelvic floor dysfunction. This may be contributing to some of your symptoms. We will continue with our evaluation but I do want you to consider adding on fiber supplement with low-dose MiraLAX daily. We could also refer to pelvic floor physical therapy.   Pelvic Floor Dysfunction, Female Pelvic floor dysfunction (PFD) is a condition that results when the group of muscles and connective tissues that support the organs in the pelvis (pelvic floor muscles) do not work well. These muscles and their connections form a sling that supports the colon and bladder. In women, they also support the  uterus. PFD causes pelvic floor muscles to be too weak, too tight, or both. In PFD, muscle movements are not coordinated. This may cause bowel or bladder problems. It may also cause pain. What are the causes? This condition may be caused by an injury to the pelvic area or by a weakening of pelvic muscles. This often results from pregnancy and childbirth or other types of strain. In many cases, the exact cause is not known. What increases the risk? The following factors may make you more likely to develop this condition: Having chronic bladder tissue inflammation (interstitial cystitis). Being an older person. Being overweight. History of radiation treatment for cancer in the pelvic region. Previous pelvic surgery, such as removal of the uterus (hysterectomy). What are the signs or symptoms? Symptoms of this condition vary and may include: Bladder symptoms, such as: Trouble starting urination and emptying the bladder. Frequent urinary tract infections. Leaking urine when coughing, laughing, or exercising (stress incontinence). Having to pass urine urgently or frequently. Pain when passing urine. Bowel symptoms, such as: Constipation. Urgent or frequent bowel movements. Incomplete bowel movements. Painful bowel movements. Leaking stool or gas. Unexplained genital or rectal pain. Genital or rectal muscle spasms. Low back pain. Other symptoms may include: A heavy, full, or aching feeling in the vagina. A bulge that protrudes into the vagina. Pain during or after sex. How is this diagnosed? This condition may be diagnosed based on: Your symptoms and medical history. A physical exam. During the exam, your health care provider may check your pelvic muscles for tightness, spasm, pain, or weakness. This may include a rectal exam and a pelvic exam. In some cases, you may have diagnostic tests, such as: Electrical muscle function tests. Urine flow testing. X-ray tests of bowel  function. Ultrasound of the pelvic organs. How is this treated? Treatment for this condition depends on the symptoms. Treatment options include: Physical therapy. This may include Kegel exercises to help relax or strengthen the pelvic floor muscles. Biofeedback. This type of therapy provides feedback on how tight your pelvic floor muscles are so that you can learn to control them. Internal or external massage therapy. A treatment that involves electrical stimulation of the pelvic floor muscles to help control pain (transcutaneous electrical nerve stimulation, or TENS). Sound wave therapy (ultrasound) to reduce muscle spasms. Medicines, such as: Muscle relaxants. Bladder control medicines. Surgery to reconstruct or support pelvic floor muscles may be an option if other treatments do not help. Follow these instructions at home: Activity Do your usual activities as told by your health care provider. Ask your health care provider if you should modify any activities.  Do pelvic floor strengthening or relaxing exercises at home as told by your physical therapist. Lifestyle Maintain a healthy weight. Eat foods that are high in fiber, such as beans, whole grains, and fresh fruits and vegetables. Limit foods that are high in fat and processed sugars, such as fried or sweet foods. Manage stress with relaxation techniques such as yoga or meditation. General instructions If you have problems with leakage: Use absorbable pads or wear padded underwear. Wash frequently with mild soap. Keep your genital and anal area as clean and dry as possible. Ask your health care provider if you should try a barrier cream to prevent skin irritation. Take warm baths to relieve pelvic muscle tension or spasms. Take over-the-counter and prescription medicines only as told by your health care provider. Keep all follow-up visits. How is this prevented? The cause of PFD is not always known, but there are a few things  you can do to reduce the risk of developing this condition, including: Staying at a healthy weight. Getting regular exercise. Managing stress. Contact a health care provider if: Your symptoms are not improving with home care. You have signs or symptoms of PFD that get worse at home. You develop new signs or symptoms. You have signs of a urinary tract infection, such as: Fever. Chills. Increased urinary frequency. A burning feeling when urinating. You have not had a bowel movement in 3 days (constipation). Summary Pelvic floor dysfunction results when the muscles and connective tissues in your pelvic floor do not work well. These muscles and their connections form a sling that supports your colon and bladder. In women, they also support the uterus. PFD may be caused by an injury to the pelvic area or by a weakening of pelvic muscles. PFD causes pelvic floor muscles to be too weak, too tight, or a combination of both. Symptoms may vary from person to person. In most cases, PFD can be treated with physical therapies and medicines. Surgery may be an option if other treatments do not help. This information is not intended to replace advice given to you by your health care provider. Make sure you discuss any questions you have with your health care provider. Document Revised: 11/01/2020 Document Reviewed: 11/01/2020 Elsevier Patient Education  2022 [employer].

## 2024-08-27 ENCOUNTER — Encounter: Admitting: Gastroenterology

## 2024-09-10 ENCOUNTER — Encounter: Admitting: Gastroenterology
